# Patient Record
Sex: Male | Born: 1955 | Race: White | Hispanic: No | Marital: Married | Smoking: Former smoker
Health system: Southern US, Community
[De-identification: ages and names within clinical notes are randomized; demographics above are authoritative.]

## PROBLEM LIST (undated history)

## (undated) DIAGNOSIS — I10 Essential (primary) hypertension: Secondary | ICD-10-CM

## (undated) DIAGNOSIS — I4891 Unspecified atrial fibrillation: Secondary | ICD-10-CM

## (undated) DIAGNOSIS — M109 Gout, unspecified: Secondary | ICD-10-CM

## (undated) HISTORY — DX: Essential (primary) hypertension: I10

## (undated) HISTORY — PX: OTHER SURGICAL HISTORY: SHX169

## (undated) HISTORY — DX: Gout, unspecified: M10.9

---

## 2014-04-12 ENCOUNTER — Emergency Department: Payer: Self-pay | Admitting: Emergency Medicine

## 2014-04-12 ENCOUNTER — Ambulatory Visit
Admit: 2014-04-12 | Disposition: A | Discharge status: Home / Self Care | Payer: Self-pay | Admitting: Unknown Physician Specialty

## 2014-04-12 LAB — CBC
HCT: 47.5 % (ref 40.0–52.0)
HGB: 16.1 g/dL (ref 13.0–18.0)
MCH: 30.8 pg (ref 26.0–34.0)
MCHC: 33.9 g/dL (ref 32.0–36.0)
MCV: 91 fL (ref 80–100)
Platelet: 232 10*3/uL (ref 150–440)
RBC: 5.24 10*6/uL (ref 4.40–5.90)
RDW: 13.4 % (ref 11.5–14.5)
WBC: 7.5 10*3/uL (ref 3.8–10.6)

## 2014-04-12 LAB — BASIC METABOLIC PANEL
Anion Gap: 9 (ref 7–16)
BUN: 12 mg/dL (ref 7–18)
CALCIUM: 8.2 mg/dL — AB (ref 8.5–10.1)
CHLORIDE: 106 mmol/L (ref 98–107)
CO2: 24 mmol/L (ref 21–32)
Creatinine: 0.99 mg/dL (ref 0.60–1.30)
EGFR (African American): >60
Glucose: 89 mg/dL (ref 65–99)
OSMOLALITY: 277 (ref 275–301)
POTASSIUM: 3.9 mmol/L (ref 3.5–5.1)
Sodium: 139 mmol/L (ref 136–145)

## 2014-04-12 LAB — TROPONIN I: Troponin-I: <0.02 ng/mL

## 2014-07-09 ENCOUNTER — Ambulatory Visit
Admit: 2014-07-09 | Disposition: A | Discharge status: Home / Self Care | Payer: Self-pay | Attending: Family Medicine | Admitting: Family Medicine

## 2014-09-20 ENCOUNTER — Ambulatory Visit (INDEPENDENT_AMBULATORY_CARE_PROVIDER_SITE_OTHER): Payer: BLUE CROSS/BLUE SHIELD | Admitting: Family Medicine

## 2014-09-20 ENCOUNTER — Encounter: Payer: Self-pay | Admitting: Family Medicine

## 2014-09-20 VITALS — BP 125/74 | HR 93 | Temp 98.4°F | Ht 70.4 in | Wt 230.4 lb

## 2014-09-20 DIAGNOSIS — M109 Gout, unspecified: Secondary | ICD-10-CM | POA: Insufficient documentation

## 2014-09-20 DIAGNOSIS — M10041 Idiopathic gout, right hand: Secondary | ICD-10-CM | POA: Diagnosis not present

## 2014-09-20 MED ORDER — PREDNISONE 10 MG PO TABS: ORAL_TABLET | ORAL | Status: DC

## 2014-09-20 NOTE — Progress Notes (Signed)
  BP 125/74 mmHg  Pulse 93  Temp(Src) 98.4 F (36.9 C) (Oral)  Ht 5' 10.4" (1.788 m)  Wt 230 lb 6.4 oz (104.509 kg)  BMI 32.69 kg/m2  SpO2 99%   Subjective:    Patient ID: Travis Diaz, male    DOB: 1955-10-21, 59 y.o.   MRN: [id number]  HPI: Travis Diaz is a 59 y.o. male  Chief Complaint  Patient presents with  . Gout    patient is currently on colchicine, has not been able to start the Allopurinol yet   GOUT- has been working with Dr. Jeananne Rama to try to get the gout under control usually has pain every 2-3 days and hasn't been able to take the allopurinol  Duration:days Right 1st metatarsophalangeal pain: yes Severity: severe  Quality: tender Swelling: yes Redness: yes Trauma: no Recent dietary change or indiscretion: no Fevers: no Nausea/vomiting: no Status:  worse Treatments attempted: colcrys  Relevant past medical, surgical, family and social history reviewed and updated as indicated. Interim medical history since our last visit reviewed. Allergies and medications reviewed and updated.  Review of Systems  Constitutional: Negative.   Gastrointestinal: Negative.   Musculoskeletal: Positive for arthralgias. Negative for myalgias, back pain, joint swelling, gait problem, neck pain and neck stiffness.  Skin: Positive for color change. Negative for pallor, rash and wound.    Per HPI unless specifically indicated above     Objective:    BP 125/74 mmHg  Pulse 93  Temp(Src) 98.4 F (36.9 C) (Oral)  Ht 5' 10.4" (1.788 m)  Wt 230 lb 6.4 oz (104.509 kg)  BMI 32.69 kg/m2  SpO2 99%  Wt Readings from Last 3 Encounters:  09/20/14 229 lb (103.874 kg)  09/20/14 230 lb 6.4 oz (104.509 kg)    Physical Exam  Constitutional: He is oriented to person, place, and time. He appears well-developed and well-nourished. No distress.  HENT:  Head: Normocephalic and atraumatic.  Right Ear: Hearing normal.  Left Ear: Hearing normal.  Nose: Nose normal.  Eyes:  Conjunctivae and lids are normal. Right eye exhibits no discharge. Left eye exhibits no discharge. No scleral icterus.  Pulmonary/Chest: Effort normal. No respiratory distress.  Musculoskeletal: Normal range of motion. He exhibits edema.  Neurological: He is alert and oriented to person, place, and time.  Skin: Skin is warm, dry and intact. No rash noted. There is erythema. There is pallor.  1st metacarpal joint on the R hand swollen red, and very tender to touch  Psychiatric: He has a normal mood and affect. His speech is normal and behavior is normal. Judgment and thought content normal. Cognition and memory are normal.      Assessment & Plan:   Problem List Items Addressed This Visit      Other   Acute gout - Primary    In acute flare. Has not been able to get his gout under control in over 3 years. We will refer to rheumatology to see if we can get him under control and will treat with prednisone instead of the colcrys. Call with any concerns or if not getting better.       Relevant Orders   Ambulatory referral to Rheumatology       Follow up plan: Return if symptoms worsen or fail to improve.

## 2014-09-20 NOTE — Assessment & Plan Note (Signed)
In acute flare. Has not been able to get his gout under control in over 3 years. We will refer to rheumatology to see if we can get him under control and will treat with prednisone instead of the colcrys. Call with any concerns or if not getting better.

## 2014-09-20 NOTE — Patient Instructions (Signed)

## 2014-10-16 ENCOUNTER — Telehealth: Payer: Self-pay

## 2014-10-16 NOTE — Telephone Encounter (Signed)
Received a fax from rheumatology, they have not been able to reach him. They tried calling him 3 or 4 times.

## 2014-11-11 ENCOUNTER — Telehealth: Payer: Self-pay | Admitting: Family Medicine

## 2014-11-11 NOTE — Telephone Encounter (Signed)
Pt needs a refill on benazapril and alliopurinol sent to [facility]

## 2014-11-15 ENCOUNTER — Telehealth: Payer: Self-pay | Admitting: Family Medicine

## 2014-11-15 MED ORDER — ALLOPURINOL 300 MG PO TABS: 300.0000 mg | ORAL_TABLET | Freq: Every day | ORAL | Status: DC

## 2014-11-15 MED ORDER — BENAZEPRIL HCL 40 MG PO TABS: 40.0000 mg | ORAL_TABLET | Freq: Every day | ORAL | Status: DC

## 2014-11-15 NOTE — Telephone Encounter (Signed)
PT WOULD LIKE ENOUGH  ALLOPURINOL AND BENAZOPRIL TO LAST HIM UNTIL HIS LAST APPT ON 8/31 SENT TO [facility]

## 2014-11-18 MED ORDER — BENAZEPRIL HCL 40 MG PO TABS: 40.0000 mg | ORAL_TABLET | Freq: Every day | ORAL | Status: DC

## 2014-11-18 MED ORDER — ALLOPURINOL 300 MG PO TABS: 300.0000 mg | ORAL_TABLET | Freq: Every day | ORAL | Status: DC

## 2014-11-18 NOTE — Telephone Encounter (Signed)
Your patient.  Thanks 

## 2014-11-27 ENCOUNTER — Encounter: Payer: Self-pay | Admitting: Family Medicine

## 2014-11-27 ENCOUNTER — Ambulatory Visit (INDEPENDENT_AMBULATORY_CARE_PROVIDER_SITE_OTHER): Payer: BLUE CROSS/BLUE SHIELD | Admitting: Family Medicine

## 2014-11-27 VITALS — BP 122/84 | HR 92 | Temp 99.2°F | Ht 71.0 in | Wt 227.0 lb

## 2014-11-27 DIAGNOSIS — I10 Essential (primary) hypertension: Secondary | ICD-10-CM | POA: Insufficient documentation

## 2014-11-27 DIAGNOSIS — M1000 Idiopathic gout, unspecified site: Secondary | ICD-10-CM | POA: Diagnosis not present

## 2014-11-27 MED ORDER — BENAZEPRIL HCL 40 MG PO TABS: 40.0000 mg | ORAL_TABLET | Freq: Every day | ORAL | Status: DC

## 2014-11-27 MED ORDER — ALLOPURINOL 300 MG PO TABS: 300.0000 mg | ORAL_TABLET | Freq: Every day | ORAL | Status: DC

## 2014-11-27 NOTE — Assessment & Plan Note (Signed)
The current medical regimen is effective;  continue present plan and medications.  

## 2014-11-27 NOTE — Progress Notes (Signed)
BP 122/84 mmHg  Pulse 92  Temp(Src) 99.2 F (37.3 C)  Ht 5' 11"  (1.803 m)  Wt 227 lb (102.967 kg)  BMI 31.67 kg/m2  SpO2 96%   Subjective:    Patient ID: Travis Diaz, male    DOB: 25-Sep-1955, 59 y.o.   MRN: [id number]  HPI: Travis Diaz is a 59 y.o. male  Chief Complaint  Patient presents with  . Hypertension  . Gout   Patient doing well blood pressure no complaints no problems from medications taken faithfully  Gout prednisone stopped pain caused weight gain had problems. Prednisone weight is back to baseline no further gout symptoms taking allopurinol without any problems.   Has some mild wrist arthritis symptoms in his right wristRelevant past medical, surgical, family and social history reviewed and updated as indicated. Interim medical history since our last visit reviewed. Allergies and medications reviewed and updated.  Review of Systems  Constitutional: Negative.   Respiratory: Negative.   Cardiovascular: Negative.     Per HPI unless specifically indicated above     Objective:    BP 122/84 mmHg  Pulse 92  Temp(Src) 99.2 F (37.3 C)  Ht 5' 11"  (1.803 m)  Wt 227 lb (102.967 kg)  BMI 31.67 kg/m2  SpO2 96%  Wt Readings from Last 3 Encounters:  11/27/14 227 lb (102.967 kg)  09/20/14 229 lb (103.874 kg)  09/20/14 230 lb 6.4 oz (104.509 kg)    Physical Exam  Constitutional: He is oriented to person, place, and time. He appears well-developed and well-nourished. No distress.  HENT:  Head: Normocephalic and atraumatic.  Right Ear: Hearing normal.  Left Ear: Hearing normal.  Nose: Nose normal.  Eyes: Conjunctivae and lids are normal. Right eye exhibits no discharge. Left eye exhibits no discharge. No scleral icterus.  Cardiovascular: Normal rate, regular rhythm and normal heart sounds.   Pulmonary/Chest: Effort normal and breath sounds normal. No respiratory distress.  Musculoskeletal: Normal range of motion.  Neurological: He is alert  and oriented to person, place, and time.  Skin: Skin is intact. No rash noted.  Psychiatric: He has a normal mood and affect. His speech is normal and behavior is normal. Judgment and thought content normal. Cognition and memory are normal.    Results for orders placed or performed in visit on 04/12/14  Troponin I  Result Value Ref Range   Troponin-I < 0.02 ng/mL  CBC  Result Value Ref Range   WBC 7.5 3.8-10.6 x10 3/mm 3   RBC 5.24 4.40-5.90 x10 6/mm 3   HGB 16.1 13.0-18.0 g/dL   HCT 47.5 40.0-52.0 %   MCV 91 80-100 fL   MCH 30.8 26.0-34.0 pg   MCHC 33.9 32.0-36.0 g/dL   RDW 13.4 11.5-14.5 %   Platelet 232 150-440 x10 3/mm 3  Basic metabolic panel  Result Value Ref Range   Glucose 89 65-99 mg/dL   BUN 12 7-18 mg/dL   Creatinine 0.99 0.60-1.30 mg/dL   Sodium 139 136-145 mmol/L   Potassium 3.9 3.5-5.1 mmol/L   Chloride 106 98-107 mmol/L   Co2 24 21-32 mmol/L   Calcium, Total 8.2 (L) 8.5-10.1 mg/dL   Osmolality 277 275-301   Anion Gap 9 7-16   EGFR (African American) >60 >23m/min   EGFR (Non-African Amer.) >60 >622mmin      Assessment & Plan:   Problem List Items Addressed This Visit      Cardiovascular and Mediastinum   Hypertension    The current medical regimen is effective;  continue present plan and medications.       Relevant Orders   Basic metabolic panel     Other   Acute gout - Primary    The current medical regimen is effective;  continue present plan and medications.       Relevant Orders   Uric acid       Follow up plan: Return if symptoms worsen or fail to improve, for Physical Exam.

## 2014-11-27 NOTE — Addendum Note (Signed)
Addended byGolden Pop on: 11/27/2014 04:17 PM   Modules accepted: Orders, SmartSet

## 2014-11-28 ENCOUNTER — Encounter: Payer: Self-pay | Admitting: Family Medicine

## 2014-11-28 LAB — BASIC METABOLIC PANEL
BUN/Creatinine Ratio: 19 (ref 9–20)
BUN: 20 mg/dL (ref 6–24)
CO2: 21 mmol/L (ref 18–29)
CREATININE: 1.08 mg/dL (ref 0.76–1.27)
Calcium: 9.2 mg/dL (ref 8.7–10.2)
Chloride: 101 mmol/L (ref 97–108)
GFR, EST AFRICAN AMERICAN: 86 mL/min/{1.73_m2} (ref >59)
GFR, EST NON AFRICAN AMERICAN: 75 mL/min/{1.73_m2} (ref >59)
Glucose: 81 mg/dL (ref 65–99)
Potassium: 4.8 mmol/L (ref 3.5–5.2)
SODIUM: 140 mmol/L (ref 134–144)

## 2014-11-28 LAB — URIC ACID: Uric Acid: 4.9 mg/dL (ref 3.7–8.6)

## 2015-01-09 ENCOUNTER — Encounter: Payer: Self-pay | Admitting: Family Medicine

## 2015-01-09 ENCOUNTER — Ambulatory Visit (INDEPENDENT_AMBULATORY_CARE_PROVIDER_SITE_OTHER): Payer: BLUE CROSS/BLUE SHIELD | Admitting: Family Medicine

## 2015-01-09 VITALS — BP 123/87 | HR 116 | Temp 99.2°F | Ht 69.4 in | Wt 223.0 lb

## 2015-01-09 DIAGNOSIS — Z0000 Encounter for general adult medical examination without abnormal findings: Secondary | ICD-10-CM | POA: Diagnosis not present

## 2015-01-09 DIAGNOSIS — I10 Essential (primary) hypertension: Secondary | ICD-10-CM | POA: Diagnosis not present

## 2015-01-09 DIAGNOSIS — M129 Arthropathy, unspecified: Secondary | ICD-10-CM

## 2015-01-09 DIAGNOSIS — M1000 Idiopathic gout, unspecified site: Secondary | ICD-10-CM

## 2015-01-09 DIAGNOSIS — M170 Bilateral primary osteoarthritis of knee: Secondary | ICD-10-CM | POA: Insufficient documentation

## 2015-01-09 LAB — URINALYSIS, ROUTINE W REFLEX MICROSCOPIC
BILIRUBIN UA: NEGATIVE
GLUCOSE, UA: NEGATIVE
Ketones, UA: NEGATIVE
Leukocytes, UA: NEGATIVE
Nitrite, UA: NEGATIVE
PROTEIN UA: NEGATIVE
RBC, UA: NEGATIVE
SPEC GRAV UA: 1.03 (ref 1.005–1.030)
Urobilinogen, Ur: 0.2 mg/dL (ref 0.2–1.0)
pH, UA: 5 (ref 5.0–7.5)

## 2015-01-09 MED ORDER — ALLOPURINOL 300 MG PO TABS: 300.0000 mg | ORAL_TABLET | Freq: Every day | ORAL | Status: DC

## 2015-01-09 MED ORDER — MELOXICAM 15 MG PO TABS: 15.0000 mg | ORAL_TABLET | Freq: Every day | ORAL | Status: DC

## 2015-01-09 MED ORDER — BENAZEPRIL HCL 40 MG PO TABS: 40.0000 mg | ORAL_TABLET | Freq: Every day | ORAL | Status: DC

## 2015-01-09 NOTE — Progress Notes (Signed)
BP 123/87 mmHg  Pulse 116  Temp(Src) 99.2 F (37.3 C)  Ht 5' 9.4" (1.763 m)  Wt 223 lb (101.152 kg)  BMI 32.54 kg/m2  SpO2 97%   Subjective:    Patient ID: Travis Diaz, male    DOB: 02-08-1956, 59 y.o.   MRN: [id number]  HPI: Travis Diaz is a 59 y.o. male  Chief Complaint  Patient presents with  . Annual Exam   patient for physical doing well except for times at work has to work 30 days in a row. Works for [employer]. Patient's right knee will occasionally swell big with no known trauma or irritation. Gout is doing well and under control Knee problem has been going on for the last 3-4 years patient thought it was gout but after gout is been controlled knee problems still comes on. This will occur as often as once a month or so. Sometimes it'll go from knee to knee. Patient doesn't do things that would aggravate his knees other than walking a concrete floor.  Relevant past medical, surgical, family and social history reviewed and updated as indicated. Interim medical history since our last visit reviewed. Allergies and medications reviewed and updated.  Review of Systems  Constitutional: Negative.   HENT: Negative.   Eyes: Negative.   Respiratory: Negative.   Cardiovascular: Negative.   Gastrointestinal: Negative.   Endocrine: Negative.   Genitourinary: Negative.   Musculoskeletal: Negative.   Skin: Negative.   Allergic/Immunologic: Negative.   Neurological: Negative.   Hematological: Negative.   Psychiatric/Behavioral: Negative.     Per HPI unless specifically indicated above     Objective:    BP 123/87 mmHg  Pulse 116  Temp(Src) 99.2 F (37.3 C)  Ht 5' 9.4" (1.763 m)  Wt 223 lb (101.152 kg)  BMI 32.54 kg/m2  SpO2 97%  Wt Readings from Last 3 Encounters:  01/09/15 223 lb (101.152 kg)  11/27/14 227 lb (102.967 kg)  09/20/14 229 lb (103.874 kg)    Physical Exam  Constitutional: He is oriented to person, place, and time. He appears  well-developed and well-nourished.  HENT:  Head: Normocephalic and atraumatic.  Right Ear: External ear normal.  Left Ear: External ear normal.  Eyes: Conjunctivae and EOM are normal. Pupils are equal, round, and reactive to light.  Neck: Normal range of motion. Neck supple.  Cardiovascular: Normal rate, regular rhythm, normal heart sounds and intact distal pulses.   Pulmonary/Chest: Effort normal and breath sounds normal.  Abdominal: Soft. Bowel sounds are normal. There is no splenomegaly or hepatomegaly.  Genitourinary: Rectum normal, prostate normal and penis normal.  Musculoskeletal: Normal range of motion.  Neurological: He is alert and oriented to person, place, and time. He has normal reflexes.  Skin: No rash noted. No erythema.  Psychiatric: He has a normal mood and affect. His behavior is normal. Judgment and thought content normal.    Results for orders placed or performed in visit on 11/27/14  Uric acid  Result Value Ref Range   Uric Acid 4.9 3.7 - 8.6 mg/dL  Basic metabolic panel  Result Value Ref Range   Glucose 81 65 - 99 mg/dL   BUN 20 6 - 24 mg/dL   Creatinine, Ser 1.08 0.76 - 1.27 mg/dL   GFR calc non Af Amer 75 >59 mL/min/1.73   GFR calc Af Amer 86 >59 mL/min/1.73   BUN/Creatinine Ratio 19 9 - 20   Sodium 140 134 - 144 mmol/L   Potassium 4.8 3.5 - 5.2  mmol/L   Chloride 101 97 - 108 mmol/L   CO2 21 18 - 29 mmol/L   Calcium 9.2 8.7 - 10.2 mg/dL      Assessment & Plan:   Problem List Items Addressed This Visit      Cardiovascular and Mediastinum   Hypertension - Primary    The current medical regimen is effective;  continue present plan and medications.       Relevant Medications   benazepril (LOTENSIN) 40 MG tablet     Musculoskeletal and Integument   Arthritis of both knees    Discussed degenerative arthritis both knees Patient will wear neoprene sleeve from time to time  given prescription for meloxicam to use from time to time Discuss knee  health shoes and self-care. Discuss may need orthopedic referral at some point Fill out FMLA papers      Relevant Medications   allopurinol (ZYLOPRIM) 300 MG tablet   meloxicam (MOBIC) 15 MG tablet     Other   Acute gout    Patient with no further gout symptoms will continue allopurinol and hold colchicine.      Relevant Orders   Uric acid    Other Visit Diagnoses    PE (physical exam), annual        Relevant Orders    Comprehensive metabolic panel    Lipid panel    CBC with Differential/Platelet    PSA    Urinalysis, Routine w reflex microscopic (not at [facility])    TSH        Follow up plan: Return in about 6 months (around 07/10/2015) for Recheck BMP uric acid and knee arthritis.

## 2015-01-09 NOTE — Assessment & Plan Note (Signed)
The current medical regimen is effective;  continue present plan and medications.  

## 2015-01-09 NOTE — Assessment & Plan Note (Signed)
Patient with no further gout symptoms will continue allopurinol and hold colchicine.

## 2015-01-09 NOTE — Assessment & Plan Note (Addendum)
Discussed degenerative arthritis both knees Patient will wear neoprene sleeve from time to time  given prescription for meloxicam to use from time to time Discuss knee health shoes and self-care. Discuss may need orthopedic referral at some point Fill out FMLA papers

## 2015-01-10 LAB — CBC WITH DIFFERENTIAL/PLATELET
BASOS ABS: 0 10*3/uL (ref 0.0–0.2)
Basos: 0 %
EOS (ABSOLUTE): 0.2 10*3/uL (ref 0.0–0.4)
Eos: 2 %
HEMOGLOBIN: 15.7 g/dL (ref 12.6–17.7)
Hematocrit: 44.4 % (ref 37.5–51.0)
Immature Grans (Abs): 0.1 10*3/uL (ref 0.0–0.1)
Immature Granulocytes: 1 %
LYMPHS ABS: 1.4 10*3/uL (ref 0.7–3.1)
Lymphs: 14 %
MCH: 31 pg (ref 26.6–33.0)
MCHC: 35.4 g/dL (ref 31.5–35.7)
MCV: 88 fL (ref 79–97)
MONOS ABS: 0.7 10*3/uL (ref 0.1–0.9)
Monocytes: 7 %
NEUTROS ABS: 7.3 10*3/uL — AB (ref 1.4–7.0)
Neutrophils: 76 %
Platelets: 263 10*3/uL (ref 150–379)
RBC: 5.06 x10E6/uL (ref 4.14–5.80)
RDW: 13.5 % (ref 12.3–15.4)
WBC: 9.6 10*3/uL (ref 3.4–10.8)

## 2015-01-10 LAB — COMPREHENSIVE METABOLIC PANEL
ALT: 20 IU/L (ref 0–44)
AST: 18 IU/L (ref 0–40)
Albumin/Globulin Ratio: 1.9 (ref 1.1–2.5)
Albumin: 4.3 g/dL (ref 3.6–4.8)
Alkaline Phosphatase: 73 IU/L (ref 39–117)
BUN/Creatinine Ratio: 19 (ref 10–22)
BUN: 27 mg/dL (ref 8–27)
Bilirubin Total: 0.4 mg/dL (ref 0.0–1.2)
CALCIUM: 8.9 mg/dL (ref 8.6–10.2)
CO2: 23 mmol/L (ref 18–29)
CREATININE: 1.39 mg/dL — AB (ref 0.76–1.27)
Chloride: 100 mmol/L (ref 97–108)
GFR, EST AFRICAN AMERICAN: 63 mL/min/{1.73_m2} (ref >59)
GFR, EST NON AFRICAN AMERICAN: 55 mL/min/{1.73_m2} — AB (ref >59)
Globulin, Total: 2.3 g/dL (ref 1.5–4.5)
Glucose: 92 mg/dL (ref 65–99)
Potassium: 5.1 mmol/L (ref 3.5–5.2)
Sodium: 139 mmol/L (ref 134–144)
TOTAL PROTEIN: 6.6 g/dL (ref 6.0–8.5)

## 2015-01-10 LAB — LIPID PANEL
CHOL/HDL RATIO: 3.2 ratio (ref 0.0–5.0)
Cholesterol, Total: 211 mg/dL — ABNORMAL HIGH (ref 100–199)
HDL: 65 mg/dL (ref >39)
LDL CALC: 126 mg/dL — AB (ref 0–99)
TRIGLYCERIDES: 99 mg/dL (ref 0–149)
VLDL CHOLESTEROL CAL: 20 mg/dL (ref 5–40)

## 2015-01-10 LAB — URIC ACID: Uric Acid: 5.4 mg/dL (ref 3.7–8.6)

## 2015-01-10 LAB — PSA: PROSTATE SPECIFIC AG, SERUM: 1.5 ng/mL (ref 0.0–4.0)

## 2015-01-10 LAB — TSH: TSH: 5.28 u[IU]/mL — ABNORMAL HIGH (ref 0.450–4.500)

## 2015-01-13 ENCOUNTER — Telehealth: Payer: Self-pay | Admitting: Family Medicine

## 2015-01-13 DIAGNOSIS — N183 Chronic kidney disease, stage 3 unspecified: Secondary | ICD-10-CM

## 2015-01-13 DIAGNOSIS — R7989 Other specified abnormal findings of blood chemistry: Secondary | ICD-10-CM

## 2015-01-13 NOTE — Telephone Encounter (Signed)
Phone call Discussed with patient renal function declining. Patient taking a lot of nonsteroidal agents will discontinue those use Tylenol Recheck BMP 1 month Patient's TSH is increased we will recheck TSH 1 month

## 2015-01-13 NOTE — Telephone Encounter (Signed)
-----   Message from Wynn Maudlin, [place] sent at 01/13/2015  5:10 PM EDT ----- labs

## 2015-07-14 ENCOUNTER — Ambulatory Visit (INDEPENDENT_AMBULATORY_CARE_PROVIDER_SITE_OTHER): Payer: BLUE CROSS/BLUE SHIELD | Admitting: Family Medicine

## 2015-07-14 ENCOUNTER — Encounter: Payer: Self-pay | Admitting: Family Medicine

## 2015-07-14 VITALS — BP 116/82 | HR 72 | Temp 98.7°F | Ht 70.5 in | Wt 230.0 lb

## 2015-07-14 DIAGNOSIS — M1000 Idiopathic gout, unspecified site: Secondary | ICD-10-CM | POA: Diagnosis not present

## 2015-07-14 DIAGNOSIS — N183 Chronic kidney disease, stage 3 unspecified: Secondary | ICD-10-CM

## 2015-07-14 DIAGNOSIS — R946 Abnormal results of thyroid function studies: Secondary | ICD-10-CM

## 2015-07-14 DIAGNOSIS — I10 Essential (primary) hypertension: Secondary | ICD-10-CM

## 2015-07-14 DIAGNOSIS — R7989 Other specified abnormal findings of blood chemistry: Secondary | ICD-10-CM

## 2015-07-14 NOTE — Assessment & Plan Note (Signed)
The current medical regimen is effective;  continue present plan and medications.  

## 2015-07-14 NOTE — Progress Notes (Signed)
BP 116/82 mmHg  Pulse 72  Temp(Src) 98.7 F (37.1 C)  Ht 5' 10.5" (1.791 m)  Wt 230 lb (104.327 kg)  BMI 32.52 kg/m2  SpO2 96%   Subjective:    Patient ID: Travis Diaz, male    DOB: 06-18-1955, 60 y.o.   MRN: [id number]  HPI: Travis Diaz is a 60 y.o. male  Chief Complaint  Patient presents with  . Arthritis  . uric acid recheck  . Recheck TSH  Patient all in all doing well no gout flares no complaints no gout complaints or symptoms blood pressure doing well. No thyroid signs or symptoms for recheck TSH because elevated last visit  Relevant past medical, surgical, family and social history reviewed and updated as indicated. Interim medical history since our last visit reviewed. Allergies and medications reviewed and updated.  Review of Systems  Constitutional: Negative.   Respiratory: Negative.   Cardiovascular: Negative.     Per HPI unless specifically indicated above     Objective:    BP 116/82 mmHg  Pulse 72  Temp(Src) 98.7 F (37.1 C)  Ht 5' 10.5" (1.791 m)  Wt 230 lb (104.327 kg)  BMI 32.52 kg/m2  SpO2 96%  Wt Readings from Last 3 Encounters:  07/14/15 230 lb (104.327 kg)  01/09/15 223 lb (101.152 kg)  11/27/14 227 lb (102.967 kg)    Physical Exam  Constitutional: He is oriented to person, place, and time. He appears well-developed and well-nourished. No distress.  HENT:  Head: Normocephalic and atraumatic.  Right Ear: Hearing normal.  Left Ear: Hearing normal.  Nose: Nose normal.  Eyes: Conjunctivae and lids are normal. Right eye exhibits no discharge. Left eye exhibits no discharge. No scleral icterus.  Cardiovascular: Normal rate, regular rhythm and normal heart sounds.   Pulmonary/Chest: Effort normal and breath sounds normal. No respiratory distress.  Musculoskeletal: Normal range of motion.  Neurological: He is alert and oriented to person, place, and time.  Skin: Skin is intact. No rash noted.  Psychiatric: He has a normal  mood and affect. His speech is normal and behavior is normal. Judgment and thought content normal. Cognition and memory are normal.    Results for orders placed or performed in visit on 01/09/15  Comprehensive metabolic panel  Result Value Ref Range   Glucose 92 65 - 99 mg/dL   BUN 27 8 - 27 mg/dL   Creatinine, Ser 1.39 (H) 0.76 - 1.27 mg/dL   GFR calc non Af Amer 55 (L) >59 mL/min/1.73   GFR calc Af Amer 63 >59 mL/min/1.73   BUN/Creatinine Ratio 19 10 - 22   Sodium 139 134 - 144 mmol/L   Potassium 5.1 3.5 - 5.2 mmol/L   Chloride 100 97 - 108 mmol/L   CO2 23 18 - 29 mmol/L   Calcium 8.9 8.6 - 10.2 mg/dL   Total Protein 6.6 6.0 - 8.5 g/dL   Albumin 4.3 3.6 - 4.8 g/dL   Globulin, Total 2.3 1.5 - 4.5 g/dL   Albumin/Globulin Ratio 1.9 1.1 - 2.5   Bilirubin Total 0.4 0.0 - 1.2 mg/dL   Alkaline Phosphatase 73 39 - 117 IU/L   AST 18 0 - 40 IU/L   ALT 20 0 - 44 IU/L  Lipid panel  Result Value Ref Range   Cholesterol, Total 211 (H) 100 - 199 mg/dL   Triglycerides 99 0 - 149 mg/dL   HDL 65 >39 mg/dL   VLDL Cholesterol Cal 20 5 - 40 mg/dL  LDL Calculated 126 (H) 0 - 99 mg/dL   Chol/HDL Ratio 3.2 0.0 - 5.0 ratio units  CBC with Differential/Platelet  Result Value Ref Range   WBC 9.6 3.4 - 10.8 x10E3/uL   RBC 5.06 4.14 - 5.80 x10E6/uL   Hemoglobin 15.7 12.6 - 17.7 g/dL   Hematocrit 44.4 37.5 - 51.0 %   MCV 88 79 - 97 fL   MCH 31.0 26.6 - 33.0 pg   MCHC 35.4 31.5 - 35.7 g/dL   RDW 13.5 12.3 - 15.4 %   Platelets 263 150 - 379 x10E3/uL   Neutrophils 76 %   Lymphs 14 %   Monocytes 7 %   Eos 2 %   Basos 0 %   Neutrophils Absolute 7.3 (H) 1.4 - 7.0 x10E3/uL   Lymphocytes Absolute 1.4 0.7 - 3.1 x10E3/uL   Monocytes Absolute 0.7 0.1 - 0.9 x10E3/uL   EOS (ABSOLUTE) 0.2 0.0 - 0.4 x10E3/uL   Basophils Absolute 0.0 0.0 - 0.2 x10E3/uL   Immature Granulocytes 1 %   Immature Grans (Abs) 0.1 0.0 - 0.1 x10E3/uL  PSA  Result Value Ref Range   Prostate Specific Ag, Serum 1.5 0.0 - 4.0  ng/mL  Urinalysis, Routine w reflex microscopic (not at [facility])  Result Value Ref Range   Specific Gravity, UA 1.030 1.005 - 1.030   pH, UA 5.0 5.0 - 7.5   Color, UA Yellow Yellow   Appearance Ur Clear Clear   Leukocytes, UA Negative Negative   Protein, UA Negative Negative/Trace   Glucose, UA Negative Negative   Ketones, UA Negative Negative   RBC, UA Negative Negative   Bilirubin, UA Negative Negative   Urobilinogen, Ur 0.2 0.2 - 1.0 mg/dL   Nitrite, UA Negative Negative  TSH  Result Value Ref Range   TSH 5.280 (H) 0.450 - 4.500 uIU/mL  Uric acid  Result Value Ref Range   Uric Acid 5.4 3.7 - 8.6 mg/dL      Assessment & Plan:   Problem List Items Addressed This Visit      Cardiovascular and Mediastinum   Hypertension    The current medical regimen is effective;  continue present plan and medications.         Other   Acute gout - Primary    The current medical regimen is effective;  continue present plan and medications.       Relevant Orders   Uric acid    Other Visit Diagnoses    Elevated TSH        recheck TSH    CKD (chronic kidney disease) stage 3, GFR 30-59 ml/min            Follow up plan: Return in about 6 months (around 01/13/2016) for Physical Exam.

## 2015-07-15 LAB — BASIC METABOLIC PANEL
BUN / CREAT RATIO: 15 (ref 10–24)
BUN: 19 mg/dL (ref 8–27)
CO2: 21 mmol/L (ref 18–29)
CREATININE: 1.29 mg/dL — AB (ref 0.76–1.27)
Calcium: 9.2 mg/dL (ref 8.6–10.2)
Chloride: 102 mmol/L (ref 96–106)
GFR calc Af Amer: 69 mL/min/{1.73_m2} (ref >59)
GFR, EST NON AFRICAN AMERICAN: 60 mL/min/{1.73_m2} (ref >59)
Glucose: 82 mg/dL (ref 65–99)
POTASSIUM: 5.2 mmol/L (ref 3.5–5.2)
SODIUM: 141 mmol/L (ref 134–144)

## 2015-07-15 LAB — TSH: TSH: 5.59 u[IU]/mL — AB (ref 0.450–4.500)

## 2015-07-15 LAB — URIC ACID: URIC ACID: 5.4 mg/dL (ref 3.7–8.6)

## 2015-07-17 ENCOUNTER — Telehealth: Payer: Self-pay | Admitting: Family Medicine

## 2015-07-17 NOTE — Telephone Encounter (Signed)
Phone call Discussed with patient TSH still slightly elevated patient with no low thyroid symptoms will observe recheck TSH in October had physical exam time.

## 2015-12-16 ENCOUNTER — Encounter (INDEPENDENT_AMBULATORY_CARE_PROVIDER_SITE_OTHER): Payer: Self-pay

## 2015-12-18 ENCOUNTER — Telehealth: Payer: Self-pay | Admitting: Family Medicine

## 2015-12-18 MED ORDER — ALLOPURINOL 300 MG PO TABS: 300.0000 mg | ORAL_TABLET | Freq: Every day | ORAL | 1 refills | Status: DC

## 2015-12-18 MED ORDER — BENAZEPRIL HCL 40 MG PO TABS: 40.0000 mg | ORAL_TABLET | Freq: Every day | ORAL | 1 refills | Status: DC

## 2015-12-18 NOTE — Telephone Encounter (Signed)
Pt came in stated his pharmacy has been trying to contact us. Pt needs a refill Allopurinol and Benazepril. Pharm is [facility] Thanks.

## 2016-01-13 ENCOUNTER — Encounter: Payer: Self-pay | Admitting: Family Medicine

## 2016-01-13 ENCOUNTER — Ambulatory Visit (INDEPENDENT_AMBULATORY_CARE_PROVIDER_SITE_OTHER): Payer: BLUE CROSS/BLUE SHIELD | Admitting: Family Medicine

## 2016-01-13 VITALS — BP 133/88 | HR 97 | Temp 98.3°F | Ht 70.75 in | Wt 232.0 lb

## 2016-01-13 DIAGNOSIS — R946 Abnormal results of thyroid function studies: Secondary | ICD-10-CM

## 2016-01-13 DIAGNOSIS — N183 Chronic kidney disease, stage 3 unspecified: Secondary | ICD-10-CM

## 2016-01-13 DIAGNOSIS — M1000 Idiopathic gout, unspecified site: Secondary | ICD-10-CM | POA: Diagnosis not present

## 2016-01-13 DIAGNOSIS — I10 Essential (primary) hypertension: Secondary | ICD-10-CM | POA: Diagnosis not present

## 2016-01-13 DIAGNOSIS — Z0000 Encounter for general adult medical examination without abnormal findings: Secondary | ICD-10-CM | POA: Diagnosis not present

## 2016-01-13 DIAGNOSIS — M170 Bilateral primary osteoarthritis of knee: Secondary | ICD-10-CM

## 2016-01-13 DIAGNOSIS — Z23 Encounter for immunization: Secondary | ICD-10-CM

## 2016-01-13 DIAGNOSIS — Z1211 Encounter for screening for malignant neoplasm of colon: Secondary | ICD-10-CM

## 2016-01-13 DIAGNOSIS — R7989 Other specified abnormal findings of blood chemistry: Secondary | ICD-10-CM

## 2016-01-13 LAB — MICROSCOPIC EXAMINATION
Bacteria, UA: NONE SEEN
EPITHELIAL CELLS (NON RENAL): NONE SEEN /HPF (ref 0–10)
RBC MICROSCOPIC, UA: NONE SEEN /HPF (ref 0–?)
WBC, UA: NONE SEEN /hpf (ref 0–?)

## 2016-01-13 LAB — URINALYSIS, ROUTINE W REFLEX MICROSCOPIC
BILIRUBIN UA: NEGATIVE
Glucose, UA: NEGATIVE
Ketones, UA: NEGATIVE
LEUKOCYTES UA: NEGATIVE
Nitrite, UA: NEGATIVE
PROTEIN UA: NEGATIVE
Specific Gravity, UA: 1.025 (ref 1.005–1.030)
Urobilinogen, Ur: 0.2 mg/dL (ref 0.2–1.0)
pH, UA: 5 (ref 5.0–7.5)

## 2016-01-13 MED ORDER — BENAZEPRIL HCL 40 MG PO TABS: 40.0000 mg | ORAL_TABLET | Freq: Every day | ORAL | 12 refills | Status: DC

## 2016-01-13 MED ORDER — ALLOPURINOL 300 MG PO TABS: 300.0000 mg | ORAL_TABLET | Freq: Every day | ORAL | 12 refills | Status: DC

## 2016-01-13 MED ORDER — MELOXICAM 15 MG PO TABS: 15.0000 mg | ORAL_TABLET | Freq: Every day | ORAL | 3 refills | Status: DC

## 2016-01-13 NOTE — Assessment & Plan Note (Signed)
The current medical regimen is effective;  continue present plan and medications.  

## 2016-01-13 NOTE — Progress Notes (Signed)
BP 133/88   Pulse 97   Temp 98.3 F (36.8 C)   Ht 5' 10.75" (1.797 m)   Wt 232 lb (105.2 kg)   SpO2 97%   BMI 32.59 kg/m    Subjective:    Patient ID: Travis Diaz, male    DOB: 1955/12/18, 60 y.o.   MRN: [id number]  HPI: Travis Diaz is a 60 y.o. male  Chief Complaint  Patient presents with  . Annual Exam    order placed for colonoscopy   Patient concerned about thyroid status is can usually lose weight in the summer time and just hasn't has had more difficulty with weight loss eats less than he has before.  On meloxicam uses when necessary for arthritis aches and pains hasn't used much in his been okay.   Blood pressure doing well no complaints from medications taken faithfully  On no gout symptoms taking allopurinol without problems.  Relevant past medical, surgical, family and social history reviewed and updated as indicated. Interim medical history since our last visit reviewed. Allergies and medications reviewed and updated.  Review of Systems  Constitutional: Negative.   HENT: Negative.   Eyes: Negative.   Respiratory: Negative.   Cardiovascular: Negative.   Gastrointestinal: Negative.   Endocrine: Negative.   Genitourinary: Negative.   Musculoskeletal: Negative.   Skin: Negative.   Allergic/Immunologic: Negative.   Neurological: Negative.   Hematological: Negative.   Psychiatric/Behavioral: Negative.     Per HPI unless specifically indicated above     Objective:    BP 133/88   Pulse 97   Temp 98.3 F (36.8 C)   Ht 5' 10.75" (1.797 m)   Wt 232 lb (105.2 kg)   SpO2 97%   BMI 32.59 kg/m   Wt Readings from Last 3 Encounters:  01/13/16 232 lb (105.2 kg)  07/14/15 230 lb (104.3 kg)  01/09/15 223 lb (101.2 kg)    Physical Exam  Constitutional: He is oriented to person, place, and time. He appears well-developed and well-nourished.  HENT:  Head: Normocephalic and atraumatic.  Right Ear: External ear normal.  Left Ear: External  ear normal.  Eyes: Conjunctivae and EOM are normal. Pupils are equal, round, and reactive to light.  Neck: Normal range of motion. Neck supple.  Cardiovascular: Normal rate, regular rhythm, normal heart sounds and intact distal pulses.   Pulmonary/Chest: Effort normal and breath sounds normal.  Abdominal: Soft. Bowel sounds are normal. There is no splenomegaly or hepatomegaly.  Genitourinary: Rectum normal, prostate normal and penis normal.  Musculoskeletal: Normal range of motion.  Neurological: He is alert and oriented to person, place, and time. He has normal reflexes.  Skin: No rash noted. No erythema.  Psychiatric: He has a normal mood and affect. His behavior is normal. Judgment and thought content normal.    Results for orders placed or performed in visit on 07/14/15  Uric acid  Result Value Ref Range   Uric Acid 5.4 3.7 - 8.6 mg/dL  TSH  Result Value Ref Range   TSH 5.590 (H) 0.450 - 4.500 uIU/mL  Basic metabolic panel  Result Value Ref Range   Glucose 82 65 - 99 mg/dL   BUN 19 8 - 27 mg/dL   Creatinine, Ser 1.29 (H) 0.76 - 1.27 mg/dL   GFR calc non Af Amer 60 >59 mL/min/1.73   GFR calc Af Amer 69 >59 mL/min/1.73   BUN/Creatinine Ratio 15 10 - 24   Sodium 141 134 - 144 mmol/L   Potassium  5.2 3.5 - 5.2 mmol/L   Chloride 102 96 - 106 mmol/L   CO2 21 18 - 29 mmol/L   Calcium 9.2 8.6 - 10.2 mg/dL      Assessment & Plan:   Problem List Items Addressed This Visit      Cardiovascular and Mediastinum   Hypertension    The current medical regimen is effective;  continue present plan and medications.       Relevant Medications   benazepril (LOTENSIN) 40 MG tablet   Other Relevant Orders   Comprehensive metabolic panel     Musculoskeletal and Integument   Arthritis of both knees    The current medical regimen is effective;  continue present plan and medications.       Relevant Medications   meloxicam (MOBIC) 15 MG tablet   allopurinol (ZYLOPRIM) 300 MG tablet      Other   Acute gout    The current medical regimen is effective;  continue present plan and medications.       Relevant Medications   allopurinol (ZYLOPRIM) 300 MG tablet   Other Relevant Orders   Uric acid    Other Visit Diagnoses    Elevated TSH    -  Primary   Weight on blood work and reassess at that point.   Relevant Orders   TSH   CKD (chronic kidney disease) stage 3, GFR 30-59 ml/min       Relevant Orders   Comprehensive metabolic panel   PE (physical exam), annual       Relevant Orders   CBC with Differential/Platelet   Comprehensive metabolic panel   Lipid Panel w/o Chol/HDL Ratio   PSA   Urinalysis, Routine w reflex microscopic (not at [facility])   Need for diphtheria-tetanus-pertussis (Tdap) vaccine       Relevant Orders   Tdap vaccine greater than or equal to 7yo IM (Completed)   Screening for colon cancer       Relevant Orders   Ambulatory referral to Gastroenterology       Follow up plan: Return in about 6 months (around 07/13/2016) for BMP.

## 2016-01-14 LAB — COMPREHENSIVE METABOLIC PANEL
A/G RATIO: 1.8 (ref 1.2–2.2)
ALBUMIN: 4.4 g/dL (ref 3.6–4.8)
ALK PHOS: 53 IU/L (ref 39–117)
ALT: 23 IU/L (ref 0–44)
AST: 22 IU/L (ref 0–40)
BILIRUBIN TOTAL: 0.4 mg/dL (ref 0.0–1.2)
BUN / CREAT RATIO: 16 (ref 10–24)
BUN: 18 mg/dL (ref 8–27)
CHLORIDE: 99 mmol/L (ref 96–106)
CO2: 24 mmol/L (ref 18–29)
Calcium: 9.2 mg/dL (ref 8.6–10.2)
Creatinine, Ser: 1.14 mg/dL (ref 0.76–1.27)
GFR calc non Af Amer: 69 mL/min/{1.73_m2} (ref >59)
GFR, EST AFRICAN AMERICAN: 80 mL/min/{1.73_m2} (ref >59)
GLUCOSE: 86 mg/dL (ref 65–99)
Globulin, Total: 2.4 g/dL (ref 1.5–4.5)
POTASSIUM: 5.7 mmol/L — AB (ref 3.5–5.2)
Sodium: 139 mmol/L (ref 134–144)
TOTAL PROTEIN: 6.8 g/dL (ref 6.0–8.5)

## 2016-01-14 LAB — CBC WITH DIFFERENTIAL/PLATELET
BASOS: 0 %
Basophils Absolute: 0 10*3/uL (ref 0.0–0.2)
EOS (ABSOLUTE): 0.2 10*3/uL (ref 0.0–0.4)
Eos: 3 %
Hematocrit: 47.4 % (ref 37.5–51.0)
Hemoglobin: 16.7 g/dL (ref 12.6–17.7)
IMMATURE GRANS (ABS): 0 10*3/uL (ref 0.0–0.1)
Immature Granulocytes: 1 %
LYMPHS: 19 %
Lymphocytes Absolute: 1.5 10*3/uL (ref 0.7–3.1)
MCH: 31.7 pg (ref 26.6–33.0)
MCHC: 35.2 g/dL (ref 31.5–35.7)
MCV: 90 fL (ref 79–97)
MONOS ABS: 0.7 10*3/uL (ref 0.1–0.9)
Monocytes: 9 %
NEUTROS ABS: 5.2 10*3/uL (ref 1.4–7.0)
Neutrophils: 68 %
PLATELETS: 235 10*3/uL (ref 150–379)
RBC: 5.26 x10E6/uL (ref 4.14–5.80)
RDW: 13.7 % (ref 12.3–15.4)
WBC: 7.6 10*3/uL (ref 3.4–10.8)

## 2016-01-14 LAB — LIPID PANEL W/O CHOL/HDL RATIO
CHOLESTEROL TOTAL: 207 mg/dL — AB (ref 100–199)
HDL: 68 mg/dL (ref >39)
LDL Calculated: 108 mg/dL — ABNORMAL HIGH (ref 0–99)
Triglycerides: 156 mg/dL — ABNORMAL HIGH (ref 0–149)
VLDL Cholesterol Cal: 31 mg/dL (ref 5–40)

## 2016-01-14 LAB — TSH: TSH: 4.72 u[IU]/mL — AB (ref 0.450–4.500)

## 2016-01-14 LAB — PSA: Prostate Specific Ag, Serum: 1.3 ng/mL (ref 0.0–4.0)

## 2016-01-14 LAB — URIC ACID: URIC ACID: 5.1 mg/dL (ref 3.7–8.6)

## 2016-01-19 ENCOUNTER — Telehealth: Payer: Self-pay | Admitting: Family Medicine

## 2016-01-19 ENCOUNTER — Encounter: Payer: Self-pay | Admitting: Family Medicine

## 2016-01-19 DIAGNOSIS — R7989 Other specified abnormal findings of blood chemistry: Secondary | ICD-10-CM

## 2016-01-19 NOTE — Telephone Encounter (Signed)
-----   Message from Staci Acosta, [place] sent at 01/19/2016 12:12 PM EDT ----- No answer.

## 2016-01-21 ENCOUNTER — Encounter: Payer: Self-pay | Admitting: Family Medicine

## 2016-02-12 DIAGNOSIS — Z86010 Personal history of colonic polyps: Secondary | ICD-10-CM | POA: Diagnosis not present

## 2016-05-13 MED ORDER — SODIUM CHLORIDE 0.9 % IV SOLN: INTRAVENOUS | Status: DC

## 2016-05-14 ENCOUNTER — Ambulatory Visit
Admission: RE | Admit: 2016-05-14 | Discharge: 2016-05-14 | Disposition: A | Discharge status: Home / Self Care | Payer: BLUE CROSS/BLUE SHIELD | Source: Ambulatory Visit | Attending: Unknown Physician Specialty | Admitting: Unknown Physician Specialty

## 2016-05-14 ENCOUNTER — Encounter
Admission: RE | Disposition: A | Discharge status: Home / Self Care | Payer: Self-pay | Source: Ambulatory Visit | Attending: Unknown Physician Specialty

## 2016-05-14 ENCOUNTER — Encounter: Payer: Self-pay | Admitting: *Deleted

## 2016-05-14 ENCOUNTER — Ambulatory Visit: Payer: BLUE CROSS/BLUE SHIELD | Admitting: Anesthesiology

## 2016-05-14 DIAGNOSIS — Z882 Allergy status to sulfonamides status: Secondary | ICD-10-CM | POA: Diagnosis not present

## 2016-05-14 DIAGNOSIS — Z86718 Personal history of other venous thrombosis and embolism: Secondary | ICD-10-CM | POA: Insufficient documentation

## 2016-05-14 DIAGNOSIS — I4891 Unspecified atrial fibrillation: Secondary | ICD-10-CM | POA: Diagnosis not present

## 2016-05-14 DIAGNOSIS — D122 Benign neoplasm of ascending colon: Secondary | ICD-10-CM | POA: Diagnosis not present

## 2016-05-14 DIAGNOSIS — Z86010 Personal history of colonic polyps: Secondary | ICD-10-CM | POA: Insufficient documentation

## 2016-05-14 DIAGNOSIS — Z6832 Body mass index (BMI) 32.0-32.9, adult: Secondary | ICD-10-CM | POA: Diagnosis not present

## 2016-05-14 DIAGNOSIS — D120 Benign neoplasm of cecum: Secondary | ICD-10-CM | POA: Diagnosis not present

## 2016-05-14 DIAGNOSIS — K640 First degree hemorrhoids: Secondary | ICD-10-CM | POA: Diagnosis not present

## 2016-05-14 DIAGNOSIS — Z1211 Encounter for screening for malignant neoplasm of colon: Secondary | ICD-10-CM | POA: Insufficient documentation

## 2016-05-14 DIAGNOSIS — K5730 Diverticulosis of large intestine without perforation or abscess without bleeding: Secondary | ICD-10-CM | POA: Insufficient documentation

## 2016-05-14 DIAGNOSIS — M109 Gout, unspecified: Secondary | ICD-10-CM | POA: Insufficient documentation

## 2016-05-14 DIAGNOSIS — K621 Rectal polyp: Secondary | ICD-10-CM | POA: Diagnosis not present

## 2016-05-14 DIAGNOSIS — Z87891 Personal history of nicotine dependence: Secondary | ICD-10-CM | POA: Diagnosis not present

## 2016-05-14 DIAGNOSIS — I10 Essential (primary) hypertension: Secondary | ICD-10-CM | POA: Diagnosis not present

## 2016-05-14 DIAGNOSIS — J449 Chronic obstructive pulmonary disease, unspecified: Secondary | ICD-10-CM | POA: Insufficient documentation

## 2016-05-14 DIAGNOSIS — D124 Benign neoplasm of descending colon: Secondary | ICD-10-CM | POA: Insufficient documentation

## 2016-05-14 DIAGNOSIS — D127 Benign neoplasm of rectosigmoid junction: Secondary | ICD-10-CM | POA: Diagnosis not present

## 2016-05-14 DIAGNOSIS — K635 Polyp of colon: Secondary | ICD-10-CM | POA: Diagnosis not present

## 2016-05-14 DIAGNOSIS — D128 Benign neoplasm of rectum: Secondary | ICD-10-CM | POA: Diagnosis not present

## 2016-05-14 HISTORY — PX: COLONOSCOPY WITH PROPOFOL: SHX5780

## 2016-05-14 SURGERY — COLONOSCOPY WITH PROPOFOL
Anesthesia: General

## 2016-05-14 MED ORDER — PROPOFOL 10 MG/ML IV BOLUS
INTRAVENOUS | Status: AC
  Filled 2016-05-14: qty 20

## 2016-05-14 MED ORDER — MIDAZOLAM HCL 2 MG/2ML IJ SOLN
INTRAMUSCULAR | Status: DC | PRN
  Administered 2016-05-14: 2 mg via INTRAVENOUS

## 2016-05-14 MED ORDER — MIDAZOLAM HCL 2 MG/2ML IJ SOLN
INTRAMUSCULAR | Status: AC
  Filled 2016-05-14: qty 2

## 2016-05-14 MED ORDER — PHENYLEPHRINE HCL 10 MG/ML IJ SOLN
INTRAMUSCULAR | Status: AC
  Filled 2016-05-14: qty 1

## 2016-05-14 MED ORDER — FLEET ENEMA 7-19 GM/118ML RE ENEM
1.0000 | ENEMA | Freq: Once | RECTAL | Status: AC
  Administered 2016-05-14: 1 via RECTAL

## 2016-05-14 MED ORDER — PROPOFOL 10 MG/ML IV BOLUS
INTRAVENOUS | Status: DC | PRN
  Administered 2016-05-14: 20 mg via INTRAVENOUS
  Administered 2016-05-14: 10 mg via INTRAVENOUS
  Administered 2016-05-14: 40 mg via INTRAVENOUS

## 2016-05-14 MED ORDER — PHENYLEPHRINE HCL 10 MG/ML IJ SOLN
INTRAMUSCULAR | Status: DC | PRN
  Administered 2016-05-14 (×2): 100 ug via INTRAVENOUS

## 2016-05-14 MED ORDER — SODIUM CHLORIDE 0.9 % IV SOLN
INTRAVENOUS | Status: DC
  Administered 2016-05-14: 1000 mL via INTRAVENOUS

## 2016-05-14 MED ORDER — SODIUM CHLORIDE 0.9 % IV SOLN: INTRAVENOUS | Status: DC

## 2016-05-14 MED ORDER — LIDOCAINE HCL (PF) 2 % IJ SOLN
INTRAMUSCULAR | Status: AC
  Filled 2016-05-14: qty 2

## 2016-05-14 MED ORDER — PROPOFOL 500 MG/50ML IV EMUL
INTRAVENOUS | Status: DC | PRN
  Administered 2016-05-14: 120 ug/kg/min via INTRAVENOUS

## 2016-05-14 NOTE — H&P (Signed)
Primary Care Physician:  Golden Pop, MD Primary Gastroenterologist:  Dr. Vira Agar  Pre-Procedure History & Physical: HPI:  Travis Diaz is a 61 y.o. male is here for an colonoscopy.   Past Medical History:  Diagnosis Date  . Gout   . Hypertension     Past Surgical History:  Procedure Laterality Date  . broken arm    . upper leg procedure Right    removal of blood clots    Prior to Admission medications   Medication Sig Start Date End Date Taking? Authorizing Provider  allopurinol (ZYLOPRIM) 300 MG tablet Take 1 tablet (300 mg total) by mouth daily. 01/13/16  Yes Guadalupe Maple, MD  benazepril (LOTENSIN) 40 MG tablet Take 1 tablet (40 mg total) by mouth daily. 01/13/16  Yes Guadalupe Maple, MD  Flaxseed, Linseed, (FLAXSEED OIL) 1000 MG CAPS Take by mouth.    Historical Provider, MD  Folic Acid 20 MG CAPS Take by mouth.    Historical Provider, MD  meloxicam (MOBIC) 15 MG tablet Take 1 tablet (15 mg total) by mouth daily. 01/13/16   Guadalupe Maple, MD  vitamin E 400 UNIT capsule Take by mouth.    Historical Provider, MD    Allergies as of 04/22/2016 - Review Complete 01/13/2016  Allergen Reaction Noted  . Sulfur Other (See Comments) 09/20/2014    Family History  Problem Relation Age of Onset  . Cancer Mother     male  . Heart disease Father   . Cancer Father   . Early death Father   . Heart attack Father   . Diabetes Sister   . Dementia Maternal Grandmother   . Heart attack Maternal Grandfather   . Heart attack Paternal Grandmother   . Early death Paternal Grandfather   . Heart attack Paternal Grandfather     Social History   Social History  . Marital status: Married    Spouse name: N/A  . Number of children: N/A  . Years of education: N/A   Occupational History  . Not on file.   Social History Main Topics  . Smoking status: Former Smoker    Types: Cigarettes    Quit date: 03/29/2004  . Smokeless tobacco: Never Used  . Alcohol use 14.4  oz/week    24 Cans of beer per week  . Drug use: No  . Sexual activity: Yes   Other Topics Concern  . Not on file   Social History Narrative  . No narrative on file    Review of Systems: See HPI, otherwise negative ROS  Physical Exam: BP (!) 167/107   Pulse 90   Temp 97.1 F (36.2 C) (Tympanic)   Resp 18   Ht 5\' 11"  (1.803 m)   Wt 106.6 kg (235 lb)   SpO2 99%   BMI 32.78 kg/m  General:   Alert,  pleasant and cooperative in NAD Head:  Normocephalic and atraumatic. Neck:  Supple; no masses or thyromegaly. Lungs:  Clear throughout to auscultation.    Heart:  Regular rate and rhythm. Abdomen:  Soft, nontender and nondistended. Normal bowel sounds, without guarding, and without rebound.   Neurologic:  Alert and  oriented x4;  grossly normal neurologically.  Impression/Plan: Travis Diaz is here for an colonoscopy to be performed for [facility] colon polyps  Risks, benefits, limitations, and alternatives regarding  colonoscopy have been reviewed with the patient.  Questions have been answered.  All parties agreeable.   Gaylyn Cheers, MD  05/14/2016, 10:37  AM

## 2016-05-14 NOTE — Anesthesia Post-op Follow-up Note (Signed)
Anesthesia QCDR form completed.        

## 2016-05-14 NOTE — Transfer of Care (Signed)
Immediate Anesthesia Transfer of Care Note  Patient: Travis Diaz  Procedure(s) Performed: Procedure(s): COLONOSCOPY WITH PROPOFOL (N/A)  Patient Location: PACU  Anesthesia Type:General  Level of Consciousness: awake and alert   Airway & Oxygen Therapy: Patient Spontanous Breathing and Patient connected to nasal cannula oxygen  Post-op Assessment: Report given to RN and Post -op Vital signs reviewed and stable  Post vital signs: Reviewed  Last Vitals:  Vitals:   05/14/16 1007  BP: (!) 167/107  Pulse: 90  Resp: 18  Temp: 36.2 C    Last Pain:  Vitals:   05/14/16 1007  TempSrc: Tympanic         Complications: No apparent anesthesia complications

## 2016-05-14 NOTE — Op Note (Signed)
[facility] Gastroenterology Patient Name: Travis Diaz Procedure Date: 05/14/2016 10:44 AM MRN: [id number] Account #: [id number] Date of Birth: April 14, 1955 Admit Type: Outpatient Age: 61 Room: [facility] ENDO ROOM 1 Gender: Male Note Status: Finalized Procedure:            Colonoscopy Indications:          High risk colon cancer surveillance: Personal history                        of colonic polyps Providers:            Manya Silvas, MD Referring MD:         Guadalupe Maple, MD (Referring MD) Medicines:            Propofol per Anesthesia Complications:        No immediate complications. Procedure:            Pre-Anesthesia Assessment:                       - After reviewing the risks and benefits, the patient                        was deemed in satisfactory condition to undergo the                        procedure.                       After obtaining informed consent, the colonoscope was                        passed under direct vision. Throughout the procedure,                        the patient's blood pressure, pulse, and oxygen                        saturations were monitored continuously. The                        Colonoscope was introduced through the anus and                        advanced to the the cecum, identified by appendiceal                        orifice and ileocecal valve. The colonoscopy was                        performed without difficulty. The patient tolerated the                        procedure well. The quality of the bowel preparation                        was good. Findings:      A small polyp was found in the ascending colon. The polyp was sessile.       The polyp was removed with a hot snare. Resection and retrieval were       complete.      A small polyp was found  in the ascending colon. The polyp was sessile.       The polyp was removed with a hot snare. Resection and retrieval were       complete.      Four sessile  polyps were found in the ascending colon. The polyps were       diminutive in size. These polyps were removed with a jumbo cold forceps.       Resection and retrieval were complete.      Two sessile polyps were found in the proximal ascending colon. The       polyps were large in size. These polyps were removed with a hot snare.       Resection and retrieval were complete.      A small polyp was found in the descending colon. The polyp was sessile.       The polyp was removed with a hot snare. Resection and retrieval were       complete.      Two sessile polyps were found in the descending colon. The polyps were       diminutive in size. These polyps were removed with a jumbo cold forceps.       Resection and retrieval were complete.      Four sessile polyps were found in the recto-sigmoid colon. The polyps       were diminutive in size. These polyps were removed with a jumbo cold       forceps. Resection and retrieval were complete.      A small polyp was found in the rectum. The polyp was sessile. The polyp       was removed with a hot snare. Resection and retrieval were complete.      Multiple small-mouthed diverticula were found in the sigmoid colon and       descending colon.      3 clips applied in ascending site and 1 in transverse, 1 in ascending       colon.      Internal hemorrhoids were found during endoscopy. The hemorrhoids were       small and Grade I (internal hemorrhoids that do not prolapse). Impression:           - One small polyp in the ascending colon, removed with                        a hot snare. Resected and retrieved.                       - One small polyp in the ascending colon, removed with                        a hot snare. Resected and retrieved.                       - Four diminutive polyps in the ascending colon,                        removed with a jumbo cold forceps. Resected and                        retrieved.                       - Two large  polyps in the  proximal ascending colon,                        removed with a hot snare. Resected and retrieved.                       - One small polyp in the descending colon, removed with                        a hot snare. Resected and retrieved.                       - Two diminutive polyps in the descending colon,                        removed with a jumbo cold forceps. Resected and                        retrieved.                       - Four diminutive polyps at the recto-sigmoid colon,                        removed with a jumbo cold forceps. Resected and                        retrieved.                       - One small polyp in the rectum, removed with a hot                        snare. Resected and retrieved.                       - Diverticulosis in the sigmoid colon and in the                        descending colon.                       - Internal hemorrhoids. Recommendation:       - Await pathology results. Manya Silvas, MD 05/14/2016 12:09:35 PM This report has been signed electronically. Number of Addenda: 0 Note Initiated On: 05/14/2016 10:44 AM Scope Withdrawal Time: 0 hours 54 minutes 1 second  Total Procedure Duration: 1 hour 9 minutes 42 seconds       [facility]

## 2016-05-14 NOTE — Anesthesia Preprocedure Evaluation (Addendum)
Anesthesia Evaluation  Patient identified by MRN, date of birth, ID band Patient awake    Reviewed: Allergy & Precautions, NPO status , Patient's Chart, lab work & pertinent test results  Airway Mallampati: III       Dental  (+) Teeth Intact   Pulmonary COPD, former smoker,     + decreased breath sounds      Cardiovascular Exercise Tolerance: Good hypertension, Pt. on medications + dysrhythmias Atrial Fibrillation  Rhythm:Irregular Rate:Abnormal     Neuro/Psych    GI/Hepatic negative GI ROS, Neg liver ROS,   Endo/Other  Morbid obesity  Renal/GU negative Renal ROS     Musculoskeletal   Abdominal (+) + obese,   Peds  Hematology   Anesthesia Other Findings   Reproductive/Obstetrics                            Anesthesia Physical Anesthesia Plan  ASA: III  Anesthesia Plan: General   Post-op Pain Management:    Induction: Intravenous  Airway Management Planned: Natural Airway  Additional Equipment:   Intra-op Plan:   Post-operative Plan:   Informed Consent: I have reviewed the patients History and Physical, chart, labs and discussed the procedure including the risks, benefits and alternatives for the proposed anesthesia with the patient or authorized representative who has indicated his/her understanding and acceptance.     Plan Discussed with: Surgeon  Anesthesia Plan Comments:        Anesthesia Quick Evaluation

## 2016-05-17 ENCOUNTER — Encounter: Payer: Self-pay | Admitting: Unknown Physician Specialty

## 2016-05-17 LAB — SURGICAL PATHOLOGY

## 2016-05-19 NOTE — Anesthesia Postprocedure Evaluation (Signed)
Anesthesia Post Note  Patient: Travis Diaz  Procedure(s) Performed: Procedure(s) (LRB): COLONOSCOPY WITH PROPOFOL (N/A)  Patient location during evaluation: PACU Anesthesia Type: General Level of consciousness: awake Pain management: pain level controlled Vital Signs Assessment: post-procedure vital signs reviewed and stable Respiratory status: spontaneous breathing Cardiovascular status: stable Anesthetic complications: no     Last Vitals:  Vitals:   05/14/16 1223 05/14/16 1233  BP: (!) 126/99 (!) 146/106  Pulse: 70   Resp: 16   Temp:      Last Pain:  Vitals:   05/16/16 1053  TempSrc:   PainSc: 0-No pain                 VAN STAVEREN,Starlette Thurow

## 2016-07-13 ENCOUNTER — Ambulatory Visit (INDEPENDENT_AMBULATORY_CARE_PROVIDER_SITE_OTHER): Payer: BLUE CROSS/BLUE SHIELD | Admitting: Family Medicine

## 2016-07-13 ENCOUNTER — Encounter: Payer: Self-pay | Admitting: Family Medicine

## 2016-07-13 VITALS — BP 125/87 | HR 88 | Ht 71.0 in | Wt 242.0 lb

## 2016-07-13 DIAGNOSIS — I10 Essential (primary) hypertension: Secondary | ICD-10-CM

## 2016-07-13 DIAGNOSIS — M1000 Idiopathic gout, unspecified site: Secondary | ICD-10-CM | POA: Diagnosis not present

## 2016-07-13 DIAGNOSIS — M170 Bilateral primary osteoarthritis of knee: Secondary | ICD-10-CM

## 2016-07-13 NOTE — Progress Notes (Addendum)
BP 125/87   Pulse 88   Ht 5\' 11"  (1.803 m)   Wt 242 lb (109.8 kg)   SpO2 99%   BMI 33.75 kg/m    Subjective:    Patient ID: Travis Diaz, male    DOB: [birth year], 61 y.o.   MRN: [id number]  HPI: Travis Diaz is a 61 y.o. male  Chief Complaint  Patient presents with  . Follow-up  Follow-up blood pressure doing well no complaints from medications. Also follow-up gout no complaints taking allopurinol without problems no further gout episodes. Discussed elevated weight patient has been wearing his steel toed shoes for work and has them on today.  Relevant past medical, surgical, family and social history reviewed and updated as indicated. Interim medical history since our last visit reviewed. Allergies and medications reviewed and updated.  Review of Systems  Constitutional: Negative.   Respiratory: Negative.   Cardiovascular: Negative.     Per HPI unless specifically indicated above     Objective:    BP 125/87   Pulse 88   Ht 5\' 11"  (1.803 m)   Wt 242 lb (109.8 kg)   SpO2 99%   BMI 33.75 kg/m   Wt Readings from Last 3 Encounters:  07/13/16 242 lb (109.8 kg)  05/14/16 235 lb (106.6 kg)  01/13/16 232 lb (105.2 kg)    Physical Exam  Constitutional: He is oriented to person, place, and time. He appears well-developed and well-nourished.  HENT:  Head: Normocephalic and atraumatic.  Eyes: Conjunctivae and EOM are normal.  Neck: Normal range of motion.  Cardiovascular: Normal rate, regular rhythm and normal heart sounds.   Pulmonary/Chest: Effort normal and breath sounds normal.  Musculoskeletal: Normal range of motion.  Neurological: He is alert and oriented to person, place, and time.  Skin: No erythema.  Psychiatric: He has a normal mood and affect. His behavior is normal. Judgment and thought content normal.    Results for orders placed or performed during the hospital encounter of 05/14/16  Surgical pathology  Result Value Ref Range   SURGICAL PATHOLOGY      Surgical Pathology CASE: [id number] PATIENT: Arby Barrette Surgical Pathology Report     SPECIMEN SUBMITTED: A. Colon polyp, ascending; hot snare B. Colon polyp, ascending; hot snare C. Colon polyp x4, proximal ascending; cbx D. Colon polyp, proximal ascending E. Colon polyp, descending; hot snare F. Colon polyp x2, descending; cbx G. Colon polyp x4, recto sigmoid; cbx H. Rectum polyp x2; hot and cold snare  CLINICAL HISTORY: None provided  PRE-OPERATIVE DIAGNOSIS: Personal HX of colon polyps  POST-OPERATIVE DIAGNOSIS: Diverticulosis, colon polyps     DIAGNOSIS: A. COLON POLYP, ASCENDING; HOT SNARE: - TUBULAR ADENOMA. - NEGATIVE FOR HIGH GRADE DYSPLASIA AND MALIGNANCY.  B. COLON POLYP, ASCENDING; HOT SNARE: - TUBULAR ADENOMA. - NEGATIVE FOR HIGH GRADE DYSPLASIA AND MALIGNANCY.  C. COLON POLYP 4, PROXIMAL ASCENDING; COLD BIOPSY: - TUBULAR ADENOMA (4). - NEGATIVE FOR HIGH-GRADE DYSPLASIA AND MALIGNANCY.  D. COLON POLYP, PROXIMAL ASCENDING  AND ILEOCECAL VALVE; COLD BIOPSY: - TWO TUBULAR ADENOMAS, THE LARGEST WITH FOCAL SUPERFICIAL HIGH GRADE DYSPLASIA. - CAUTERIZED BASE FREE OF DYSPLASIA.  E. COLON POLYP, DESCENDING; HOT SNARE: - TUBULAR ADENOMA. - NEGATIVE FOR HIGH GRADE DYSPLASIA AND MALIGNANCY.  F. COLON POLYP 2, DESCENDING; COLD BIOPSY: - TUBULAR ADENOMA (2). - NEGATIVE FOR HIGH-GRADE DYSPLASIA AND MALIGNANCY.  G. COLON POLYP 4, RECTOSIGMOID; COLD BIOPSY: - HYPERPLASTIC POLYP (4). - NEGATIVE FOR DYSPLASIA AND MALIGNANCY.  H. RECTUM POLYP 2; HOT AND  COLD SNARE: - HYPERPLASTIC POLYP (2). - NEGATIVE FOR DYSPLASIA AND MALIGNANCY.   GROSS DESCRIPTION:  A. Labeled: ascending colon polyp hot snare  Tissue fragment(s): 1  Size: 0.9 cm  Description: pink-tan pedunculated polyp, inked blue  Entirely submitted in one cassette(s).   B. Labeled: ascending colon polyp hot snare  Tissue fragment(s): multiple  Size:  aggregate, 2.0 x 1.0 x 0.1 cm  Description: pink to tan fragments  Entir ely submitted in one cassette(s).  C. Labeled: proximal ascending colon C BX 4  Tissue fragment(s): multiple  Size: aggregate, 1.1 x 0.4 x 0.1 cm  Description: tan fragments and fecal material  Entirely submitted in one cassette(s).  D. Labeled: proximal ascending colon polyp and ileocecal valve polyp hot snare total 2 polyp  Tissue fragment(s): 3 polypoid fragments 0.5-1.2 cm in greatest dimension and a 1.0 x 0.1 x 0.1 cm aggregate of fecal material with possible tissue fragments  Description: 3 polypoid red fragments differentially inked and 2 largest fragments bisected  Block summary 1-Aggregate of fecal material and smallest polypoid fragment 2-2 largest fragments bisected   E. Labeled: descending colon polyp hot snare  Tissue fragment(s): 1  Size: 0.6 cm  Description: pink polypoid fragment, inked blue at the base  Entirely submitted in 1 cassette(s).  F. Labeled: descending colon polyp C BX 2  Tissue fragment(s): 2  Size: 0.2 and 0.3 cm   Description: pink-tan fragments  Entirely submitted in 1 cassette(s).  G. Labeled: rectosigmoid colon polyp C BX 4  Tissue fragment(s): 4  Size: 0.2-0.3 cm  Description: pink-tan fragments  Entirely submitted in one cassette(s).  H. Labeled: rectal colon polyp hot and cold snare 2  Tissue fragment(s): multiple  Size: aggregate, 0.9 x 0.5 x 0.1 cm  Description: pink fragments and fecal material  Entirely submitted in one cassette(s).   Final Diagnosis performed by Quay Burow, MD.  Electronically signed 05/17/2016 9:31:46AM    The electronic signature indicates that the named Attending Pathologist has evaluated the specimen  Technical component performed at [facility], [address] [place], [place] [address] Lab: [phone] Dir: Darrick Penna. Evette Doffing, MD  Professional component performed at [facility], [facility], [place], [place], [place] [address] Lab: [phone] Dir: Dellia Nims. Reuel Derby, MD        Assessment & Plan:   Problem List Items Addressed This Visit      Cardiovascular and Mediastinum   Hypertension - Primary    The current medical regimen is effective;  continue present plan and medications.       Relevant Orders   Basic metabolic panel     Musculoskeletal and Integument   Arthritis of both knees    Stable filled out FMLA form        Other   Acute gout    The current medical regimen is effective;  continue present plan and medications.           Follow up plan: Return in about 6 months (around 01/12/2017) for Physical Exam and uric acid.

## 2016-07-13 NOTE — Assessment & Plan Note (Signed)
The current medical regimen is effective;  continue present plan and medications.  

## 2016-07-13 NOTE — Assessment & Plan Note (Signed)
Stable filled out FMLA form

## 2016-07-14 ENCOUNTER — Encounter: Payer: Self-pay | Admitting: Family Medicine

## 2016-07-14 LAB — BASIC METABOLIC PANEL
BUN/Creatinine Ratio: 20 (ref 10–24)
BUN: 24 mg/dL (ref 8–27)
CALCIUM: 8.6 mg/dL (ref 8.6–10.2)
CO2: 23 mmol/L (ref 18–29)
Chloride: 100 mmol/L (ref 96–106)
Creatinine, Ser: 1.19 mg/dL (ref 0.76–1.27)
GFR calc Af Amer: 76 mL/min/{1.73_m2} (ref >59)
GFR calc non Af Amer: 66 mL/min/{1.73_m2} (ref >59)
Glucose: 81 mg/dL (ref 65–99)
POTASSIUM: 4.8 mmol/L (ref 3.5–5.2)
SODIUM: 140 mmol/L (ref 134–144)

## 2016-07-14 NOTE — Progress Notes (Signed)
done

## 2017-01-15 ENCOUNTER — Other Ambulatory Visit: Payer: Self-pay | Admitting: Family Medicine

## 2017-01-15 DIAGNOSIS — I10 Essential (primary) hypertension: Secondary | ICD-10-CM

## 2017-01-15 DIAGNOSIS — M1000 Idiopathic gout, unspecified site: Secondary | ICD-10-CM

## 2017-01-17 ENCOUNTER — Encounter: Payer: Self-pay | Admitting: Family Medicine

## 2017-01-17 ENCOUNTER — Ambulatory Visit (INDEPENDENT_AMBULATORY_CARE_PROVIDER_SITE_OTHER): Payer: BLUE CROSS/BLUE SHIELD | Admitting: Family Medicine

## 2017-01-17 VITALS — BP 116/76 | HR 62 | Ht 70.47 in | Wt 236.0 lb

## 2017-01-17 DIAGNOSIS — Z114 Encounter for screening for human immunodeficiency virus [HIV]: Secondary | ICD-10-CM

## 2017-01-17 DIAGNOSIS — Z1159 Encounter for screening for other viral diseases: Secondary | ICD-10-CM | POA: Diagnosis not present

## 2017-01-17 DIAGNOSIS — I10 Essential (primary) hypertension: Secondary | ICD-10-CM

## 2017-01-17 DIAGNOSIS — Z0000 Encounter for general adult medical examination without abnormal findings: Secondary | ICD-10-CM | POA: Diagnosis not present

## 2017-01-17 DIAGNOSIS — M1000 Idiopathic gout, unspecified site: Secondary | ICD-10-CM

## 2017-01-17 DIAGNOSIS — M170 Bilateral primary osteoarthritis of knee: Secondary | ICD-10-CM

## 2017-01-17 DIAGNOSIS — Z23 Encounter for immunization: Secondary | ICD-10-CM

## 2017-01-17 LAB — URINALYSIS, ROUTINE W REFLEX MICROSCOPIC
BILIRUBIN UA: NEGATIVE
Glucose, UA: NEGATIVE
Ketones, UA: NEGATIVE
Leukocytes, UA: NEGATIVE
Nitrite, UA: NEGATIVE
PH UA: 5.5 (ref 5.0–7.5)
PROTEIN UA: NEGATIVE
RBC UA: NEGATIVE
SPEC GRAV UA: 1.025 (ref 1.005–1.030)
Urobilinogen, Ur: 0.2 mg/dL (ref 0.2–1.0)

## 2017-01-17 MED ORDER — MELOXICAM 15 MG PO TABS: 15.0000 mg | ORAL_TABLET | Freq: Every day | ORAL | 3 refills | Status: DC

## 2017-01-17 NOTE — Assessment & Plan Note (Signed)
The current medical regimen is effective;  continue present plan and medications.  

## 2017-01-17 NOTE — Assessment & Plan Note (Signed)
Discussed care and treatment weight loss arthritis medications Tylenol also filled out FMLA for his knees.

## 2017-01-17 NOTE — Progress Notes (Signed)
BP 116/76   Pulse 62   Ht 5' 10.47" (1.79 m)   Wt 236 lb (107 kg)   SpO2 95%   BMI 33.41 kg/m    Subjective:    Patient ID: Travis Diaz, male    DOB: [birth year], 61 y.o.   MRN: [id number]  HPI: Travis Diaz is a 61 y.o. male  Chief Complaint  Patient presents with  . Annual Exam  Patient follow-up hypertension doing well Benzapril without issues or concerns good blood pressure control. Meloxicam flaxseed oil does well for arthritis had to stop for colonoscopy and arthritis of his knees flared up but otherwise is back well controlled. Allopurinol's doing okay with no gout flares or gout symptoms. Is bothered by some umbilical hernia changes which is more of the presence than a bother. But nothing painful or irritating.  Relevant past medical, surgical, family and social history reviewed and updated as indicated. Interim medical history since our last visit reviewed. Allergies and medications reviewed and updated.  Review of Systems  Constitutional: Negative.   HENT: Negative.   Eyes: Negative.   Respiratory: Negative.   Cardiovascular: Negative.   Gastrointestinal: Negative.   Endocrine: Negative.   Genitourinary: Negative.   Musculoskeletal: Negative.   Skin: Negative.   Allergic/Immunologic: Negative.   Neurological: Negative.   Hematological: Negative.   Psychiatric/Behavioral: Negative.     Per HPI unless specifically indicated above     Objective:    BP 116/76   Pulse 62   Ht 5' 10.47" (1.79 m)   Wt 236 lb (107 kg)   SpO2 95%   BMI 33.41 kg/m   Wt Readings from Last 3 Encounters:  01/17/17 236 lb (107 kg)  07/13/16 242 lb (109.8 kg)  05/14/16 235 lb (106.6 kg)    Physical Exam  Constitutional: He is oriented to person, place, and time. He appears well-developed and well-nourished.  HENT:  Head: Normocephalic and atraumatic.  Right Ear: External ear normal.  Left Ear: External ear normal.  Eyes: Pupils are equal, round, and  reactive to light. Conjunctivae and EOM are normal.  Neck: Normal range of motion. Neck supple.  Cardiovascular: Normal rate, regular rhythm, normal heart sounds and intact distal pulses.   Pulmonary/Chest: Effort normal and breath sounds normal.  Abdominal: Soft. Bowel sounds are normal. There is no splenomegaly or hepatomegaly.  Genitourinary: Rectum normal, prostate normal and penis normal.  Musculoskeletal: Normal range of motion.  DJD knees  Neurological: He is alert and oriented to person, place, and time. He has normal reflexes.  Skin: No rash noted. No erythema.  Psychiatric: He has a normal mood and affect. His behavior is normal. Judgment and thought content normal.    Results for orders placed or performed in visit on 56/43/32  Basic metabolic panel  Result Value Ref Range   Glucose 81 65 - 99 mg/dL   BUN 24 8 - 27 mg/dL   Creatinine, Ser 1.19 0.76 - 1.27 mg/dL   GFR calc non Af Amer 66 >59 mL/min/1.73   GFR calc Af Amer 76 >59 mL/min/1.73   BUN/Creatinine Ratio 20 10 - 24   Sodium 140 134 - 144 mmol/L   Potassium 4.8 3.5 - 5.2 mmol/L   Chloride 100 96 - 106 mmol/L   CO2 23 18 - 29 mmol/L   Calcium 8.6 8.6 - 10.2 mg/dL      Assessment & Plan:   Problem List Items Addressed This Visit      Cardiovascular and  Mediastinum   Hypertension - Primary    The current medical regimen is effective;  continue present plan and medications.       Relevant Orders   CBC with Differential/Platelet     Musculoskeletal and Integument   Arthritis of both knees    Discussed care and treatment weight loss arthritis medications Tylenol also filled out FMLA for his knees.      Relevant Medications   meloxicam (MOBIC) 15 MG tablet     Other   Acute gout    The current medical regimen is effective;  continue present plan and medications.       Relevant Orders   Uric acid    Other Visit Diagnoses    Needs flu shot       Relevant Orders   Flu Vaccine QUAD 36+ mos IM    Encounter for screening for HIV       Relevant Orders   HIV antibody   Need for hepatitis C screening test       Relevant Orders   Hepatitis C antibody   PE (physical exam), annual       Relevant Orders   CBC with Differential/Platelet   Comprehensive metabolic panel   Lipid panel   PSA   TSH   Urinalysis, Routine w reflex microscopic   Hepatitis C antibody   HIV antibody       Follow up plan: Return in about 6 months (around 07/18/2017) for BMP.

## 2017-01-18 LAB — COMPREHENSIVE METABOLIC PANEL
A/G RATIO: 2.1 (ref 1.2–2.2)
ALK PHOS: 54 IU/L (ref 39–117)
ALT: 25 IU/L (ref 0–44)
AST: 17 IU/L (ref 0–40)
Albumin: 4.6 g/dL (ref 3.6–4.8)
BILIRUBIN TOTAL: 0.4 mg/dL (ref 0.0–1.2)
BUN/Creatinine Ratio: 18 (ref 10–24)
BUN: 22 mg/dL (ref 8–27)
CHLORIDE: 101 mmol/L (ref 96–106)
CO2: 24 mmol/L (ref 20–29)
Calcium: 9.1 mg/dL (ref 8.6–10.2)
Creatinine, Ser: 1.21 mg/dL (ref 0.76–1.27)
GFR calc Af Amer: 74 mL/min/{1.73_m2} (ref >59)
GFR calc non Af Amer: 64 mL/min/{1.73_m2} (ref >59)
Globulin, Total: 2.2 g/dL (ref 1.5–4.5)
Glucose: 85 mg/dL (ref 65–99)
POTASSIUM: 4.9 mmol/L (ref 3.5–5.2)
Sodium: 139 mmol/L (ref 134–144)
Total Protein: 6.8 g/dL (ref 6.0–8.5)

## 2017-01-18 LAB — CBC WITH DIFFERENTIAL/PLATELET
BASOS ABS: 0 10*3/uL (ref 0.0–0.2)
BASOS: 0 %
EOS (ABSOLUTE): 0.2 10*3/uL (ref 0.0–0.4)
Eos: 3 %
HEMOGLOBIN: 16.1 g/dL (ref 13.0–17.7)
Hematocrit: 46.3 % (ref 37.5–51.0)
Immature Grans (Abs): 0.1 10*3/uL (ref 0.0–0.1)
Immature Granulocytes: 1 %
LYMPHS ABS: 1.4 10*3/uL (ref 0.7–3.1)
Lymphs: 18 %
MCH: 31.2 pg (ref 26.6–33.0)
MCHC: 34.8 g/dL (ref 31.5–35.7)
MCV: 90 fL (ref 79–97)
Monocytes Absolute: 0.8 10*3/uL (ref 0.1–0.9)
Monocytes: 10 %
NEUTROS ABS: 5.3 10*3/uL (ref 1.4–7.0)
Neutrophils: 68 %
PLATELETS: 230 10*3/uL (ref 150–379)
RBC: 5.16 x10E6/uL (ref 4.14–5.80)
RDW: 14.1 % (ref 12.3–15.4)
WBC: 7.7 10*3/uL (ref 3.4–10.8)

## 2017-01-18 LAB — URIC ACID: URIC ACID: 4.6 mg/dL (ref 3.7–8.6)

## 2017-01-18 LAB — HEPATITIS C ANTIBODY

## 2017-01-18 LAB — LIPID PANEL
Chol/HDL Ratio: 3.5 ratio (ref 0.0–5.0)
Cholesterol, Total: 221 mg/dL — ABNORMAL HIGH (ref 100–199)
HDL: 63 mg/dL (ref >39)
LDL Calculated: 107 mg/dL — ABNORMAL HIGH (ref 0–99)
TRIGLYCERIDES: 257 mg/dL — AB (ref 0–149)
VLDL Cholesterol Cal: 51 mg/dL — ABNORMAL HIGH (ref 5–40)

## 2017-01-18 LAB — HIV ANTIBODY (ROUTINE TESTING W REFLEX): HIV SCREEN 4TH GENERATION: NONREACTIVE

## 2017-01-18 LAB — PSA: Prostate Specific Ag, Serum: 1.5 ng/mL (ref 0.0–4.0)

## 2017-01-18 LAB — TSH: TSH: 9.21 u[IU]/mL — ABNORMAL HIGH (ref 0.450–4.500)

## 2017-01-21 ENCOUNTER — Encounter: Payer: Self-pay | Admitting: Family Medicine

## 2017-01-21 ENCOUNTER — Telehealth: Payer: Self-pay | Admitting: Family Medicine

## 2017-01-21 DIAGNOSIS — R7989 Other specified abnormal findings of blood chemistry: Secondary | ICD-10-CM

## 2017-01-21 NOTE — Telephone Encounter (Signed)
Phone call Discussed with patient elevated TSH will recheck in about a month.

## 2017-02-18 IMAGING — CR DG WRIST COMPLETE 3+V*R*
1 series · 4 of 4 positions shown · non-contrast
Comparison: None.

CLINICAL DATA: Chronic bilateral wrist pain.

EXAM:
RIGHT WRIST - COMPLETE 3+ VIEW

[Series 1: pa · 0.17mm/px · 4 of 4 slices shown]
[im 1/4]
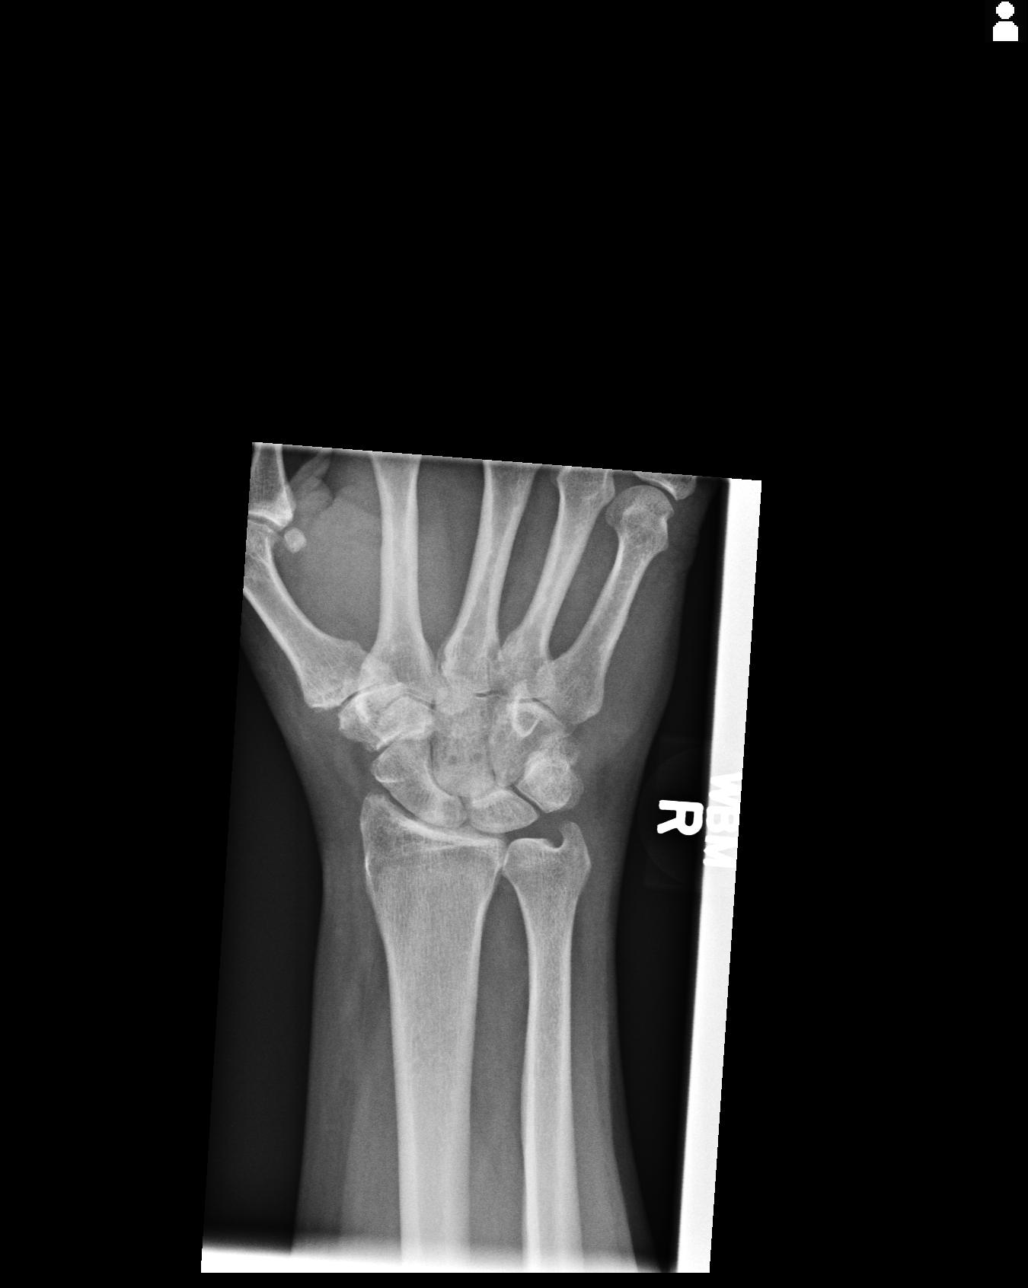
[im 2/4]
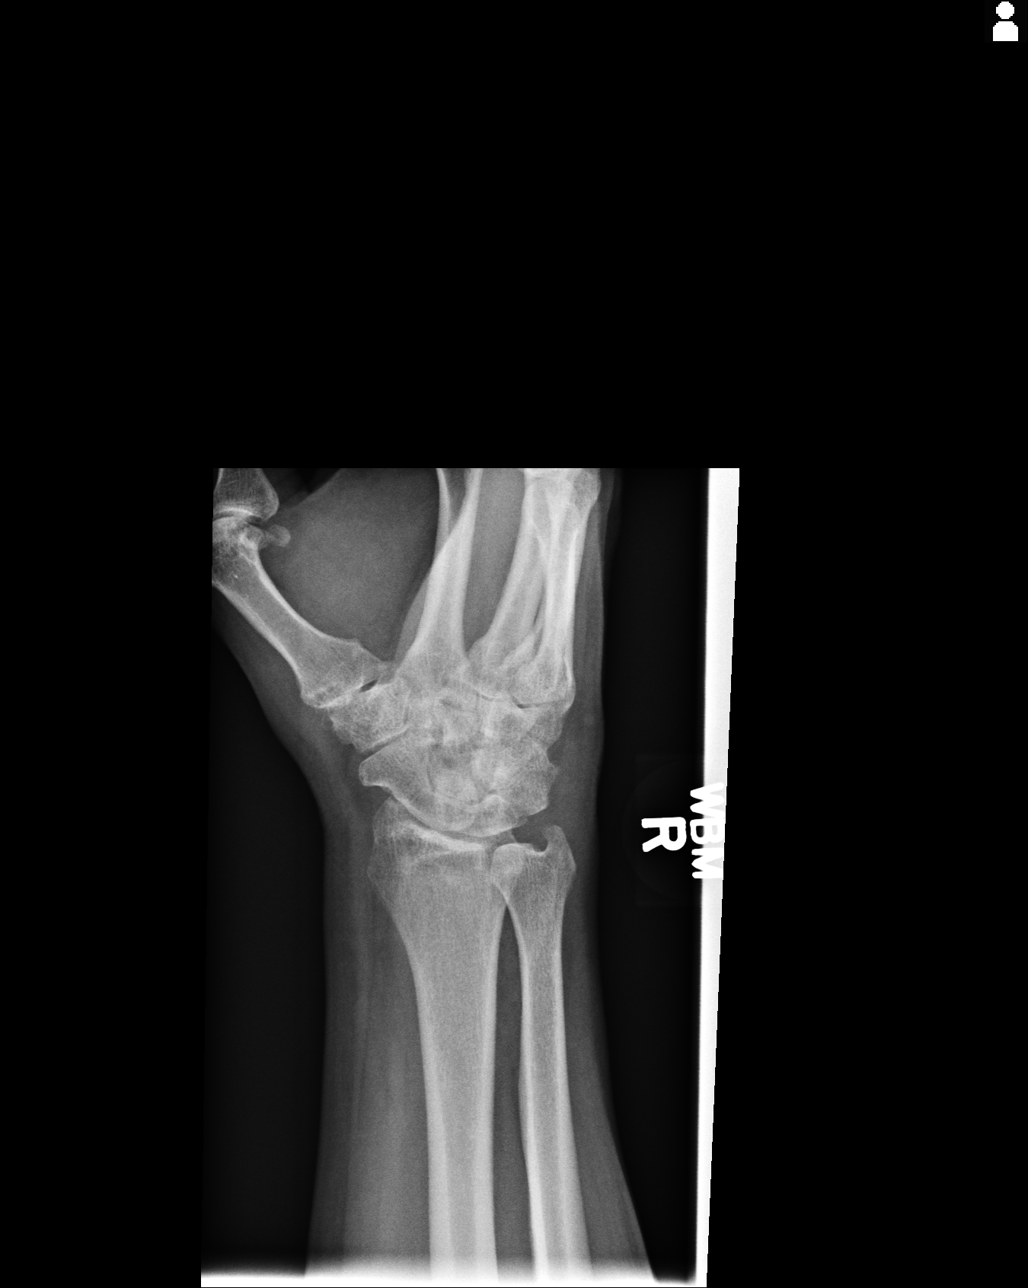
[im 3/4]
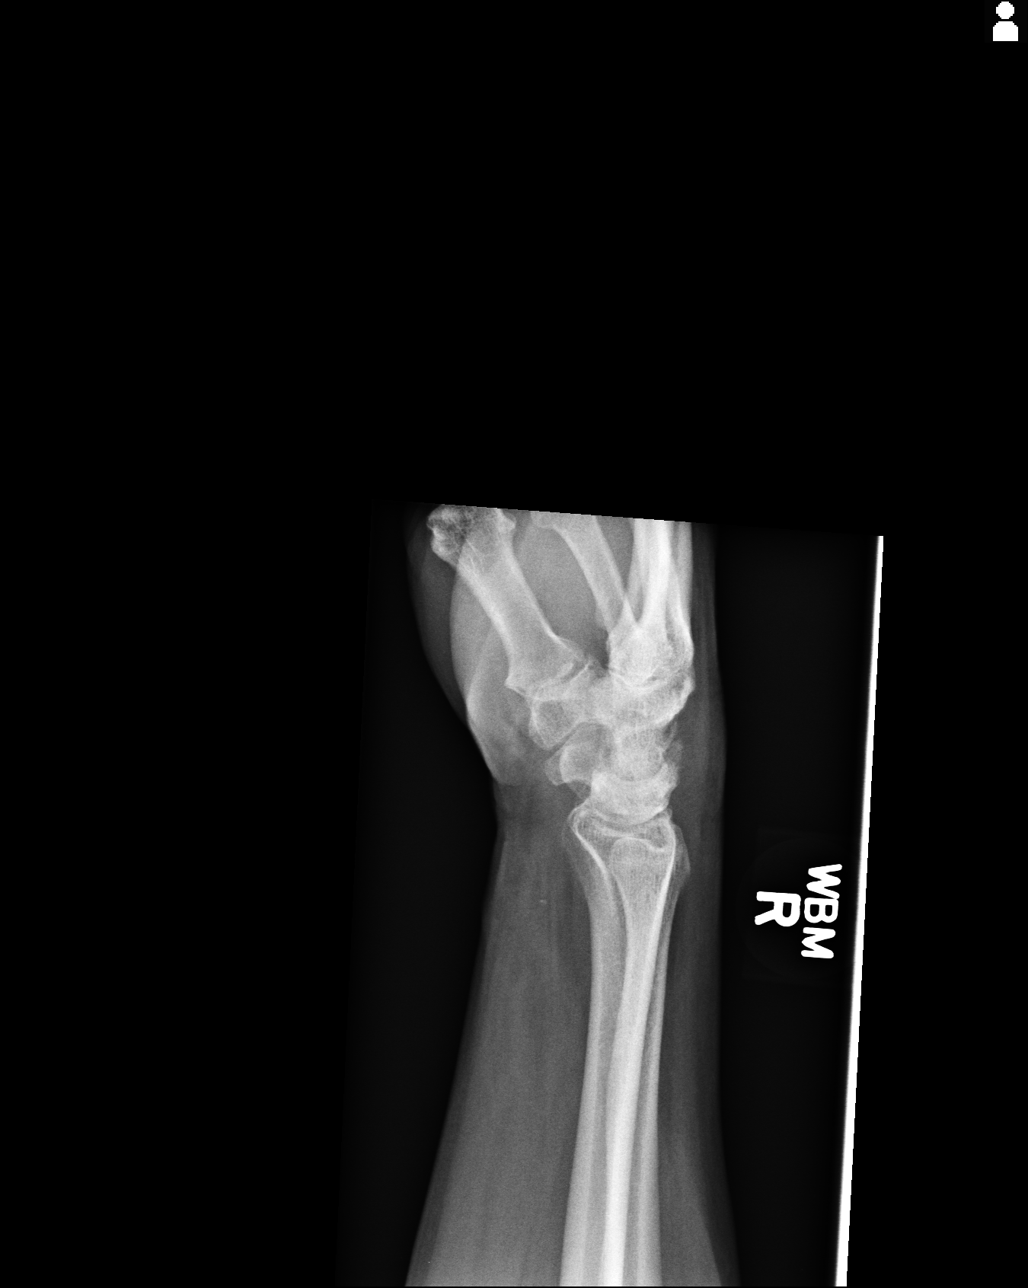
[im 4/4]
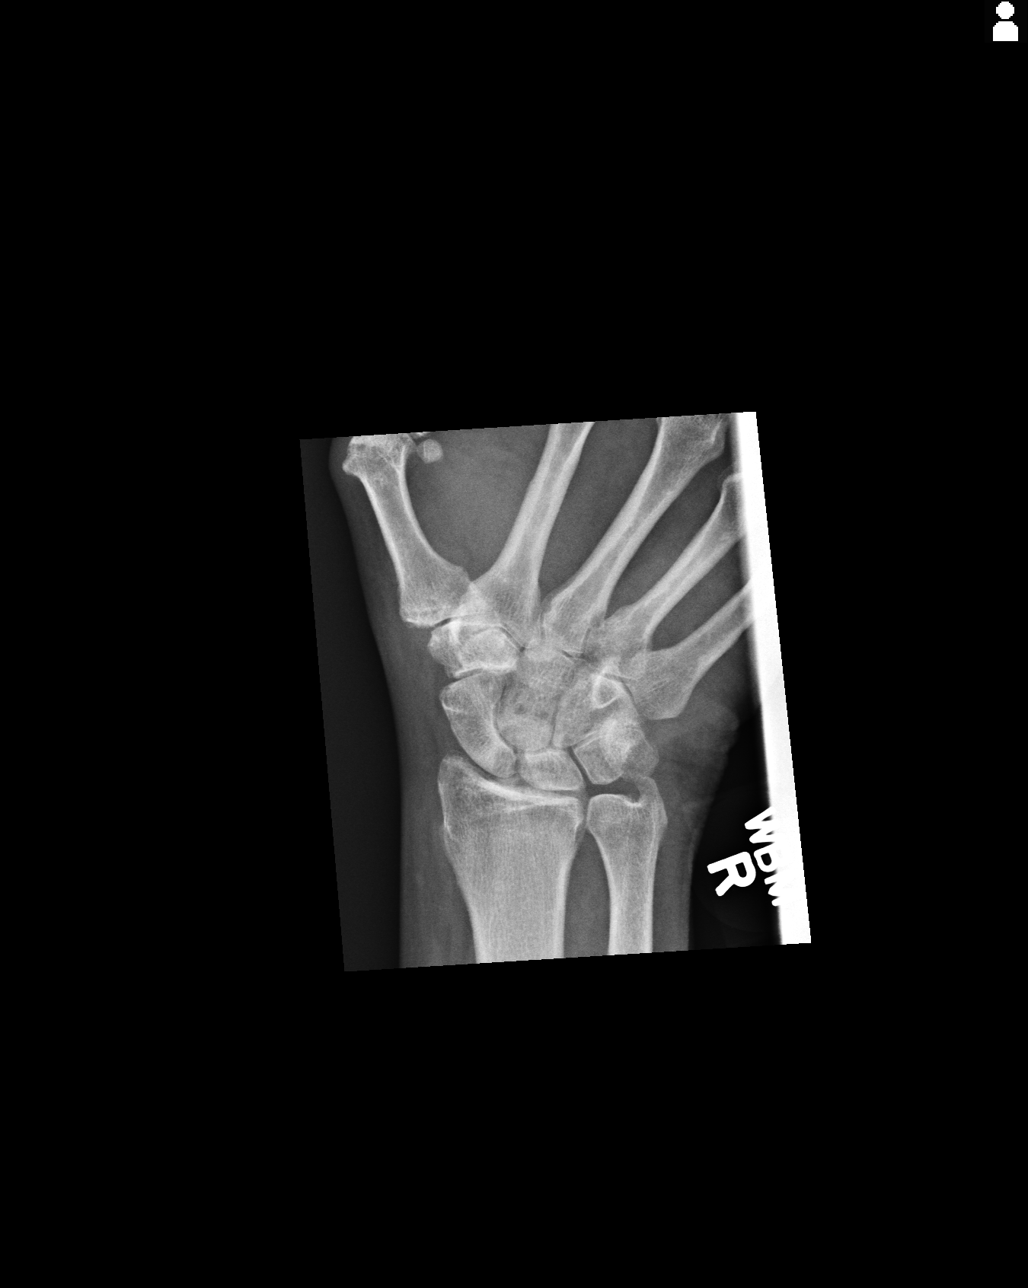

[4 of 4 positions shown; findings below may reference images not displayed]

FINDINGS: No acute bony or joint abnormality is identified. The carpal bones
appear crowded and there appear to be erosions present in the carpal
bones best seen on the oblique view. Soft tissues about the wrist
are mildly swollen. No fracture dislocation is identified.
IMPRESSION: Findings worrisome for inflammatory arthropathy such as rheumatoid.

Negative for acute abnormality.

## 2017-07-28 ENCOUNTER — Ambulatory Visit (INDEPENDENT_AMBULATORY_CARE_PROVIDER_SITE_OTHER): Payer: BLUE CROSS/BLUE SHIELD | Admitting: Family Medicine

## 2017-07-28 ENCOUNTER — Encounter: Payer: Self-pay | Admitting: Family Medicine

## 2017-07-28 VITALS — BP 120/84 | HR 101 | Ht 70.0 in | Wt 239.7 lb

## 2017-07-28 DIAGNOSIS — M170 Bilateral primary osteoarthritis of knee: Secondary | ICD-10-CM

## 2017-07-28 DIAGNOSIS — M1000 Idiopathic gout, unspecified site: Secondary | ICD-10-CM

## 2017-07-28 DIAGNOSIS — I10 Essential (primary) hypertension: Secondary | ICD-10-CM | POA: Diagnosis not present

## 2017-07-28 NOTE — Assessment & Plan Note (Signed)
The current medical regimen is effective;  continue present plan and medications.  

## 2017-07-28 NOTE — Progress Notes (Signed)
BP 120/84   Pulse (!) 101   Ht 5\' 10"  (1.778 m)   Wt 239 lb 11.2 oz (108.7 kg)   SpO2 98%   BMI 34.39 kg/m    Subjective:    Patient ID: Travis Diaz, male    DOB: 1955-10-21, 62 y.o.   MRN: [id number]  HPI: Travis Diaz is a 62 y.o. male  Chief Complaint  Patient presents with  . Follow-up  . Hypertension  Patient all in all doing well arthritis doing well controlled especially with flaxseed oil takes meloxicam from time to time he much rarely but works well when he takes it. Blood pressure Benzapril working well without problems good blood pressure control. Allopurinol doing well with no problems no gouty issues.  Relevant past medical, surgical, family and social history reviewed and updated as indicated. Interim medical history since our last visit reviewed. Allergies and medications reviewed and updated.  Review of Systems  Constitutional: Negative.   Respiratory: Negative.   Cardiovascular: Negative.     Per HPI unless specifically indicated above     Objective:    BP 120/84   Pulse (!) 101   Ht 5\' 10"  (1.778 m)   Wt 239 lb 11.2 oz (108.7 kg)   SpO2 98%   BMI 34.39 kg/m   Wt Readings from Last 3 Encounters:  07/28/17 239 lb 11.2 oz (108.7 kg)  01/17/17 236 lb (107 kg)  07/13/16 242 lb (109.8 kg)    Physical Exam  Constitutional: He is oriented to person, place, and time. He appears well-developed and well-nourished.  HENT:  Head: Normocephalic and atraumatic.  Eyes: Conjunctivae and EOM are normal.  Neck: Normal range of motion.  Cardiovascular: Normal rate, regular rhythm and normal heart sounds.  Pulmonary/Chest: Effort normal and breath sounds normal.  Musculoskeletal: Normal range of motion.  Neurological: He is alert and oriented to person, place, and time.  Skin: No erythema.  Psychiatric: He has a normal mood and affect. His behavior is normal. Judgment and thought content normal.    Results for orders placed or performed in  visit on 01/17/17  CBC with Differential/Platelet  Result Value Ref Range   WBC 7.7 3.4 - 10.8 x10E3/uL   RBC 5.16 4.14 - 5.80 x10E6/uL   Hemoglobin 16.1 13.0 - 17.7 g/dL   Hematocrit 46.3 37.5 - 51.0 %   MCV 90 79 - 97 fL   MCH 31.2 26.6 - 33.0 pg   MCHC 34.8 31.5 - 35.7 g/dL   RDW 14.1 12.3 - 15.4 %   Platelets 230 150 - 379 x10E3/uL   Neutrophils 68 Not Estab. %   Lymphs 18 Not Estab. %   Monocytes 10 Not Estab. %   Eos 3 Not Estab. %   Basos 0 Not Estab. %   Neutrophils Absolute 5.3 1.4 - 7.0 x10E3/uL   Lymphocytes Absolute 1.4 0.7 - 3.1 x10E3/uL   Monocytes Absolute 0.8 0.1 - 0.9 x10E3/uL   EOS (ABSOLUTE) 0.2 0.0 - 0.4 x10E3/uL   Basophils Absolute 0.0 0.0 - 0.2 x10E3/uL   Immature Granulocytes 1 Not Estab. %   Immature Grans (Abs) 0.1 0.0 - 0.1 x10E3/uL  Comprehensive metabolic panel  Result Value Ref Range   Glucose 85 65 - 99 mg/dL   BUN 22 8 - 27 mg/dL   Creatinine, Ser 1.21 0.76 - 1.27 mg/dL   GFR calc non Af Amer 64 >59 mL/min/1.73   GFR calc Af Amer 74 >59 mL/min/1.73   BUN/Creatinine Ratio  18 10 - 24   Sodium 139 134 - 144 mmol/L   Potassium 4.9 3.5 - 5.2 mmol/L   Chloride 101 96 - 106 mmol/L   CO2 24 20 - 29 mmol/L   Calcium 9.1 8.6 - 10.2 mg/dL   Total Protein 6.8 6.0 - 8.5 g/dL   Albumin 4.6 3.6 - 4.8 g/dL   Globulin, Total 2.2 1.5 - 4.5 g/dL   Albumin/Globulin Ratio 2.1 1.2 - 2.2   Bilirubin Total 0.4 0.0 - 1.2 mg/dL   Alkaline Phosphatase 54 39 - 117 IU/L   AST 17 0 - 40 IU/L   ALT 25 0 - 44 IU/L  Lipid panel  Result Value Ref Range   Cholesterol, Total 221 (H) 100 - 199 mg/dL   Triglycerides 257 (H) 0 - 149 mg/dL   HDL 63 >39 mg/dL   VLDL Cholesterol Cal 51 (H) 5 - 40 mg/dL   LDL Calculated 107 (H) 0 - 99 mg/dL   Chol/HDL Ratio 3.5 0.0 - 5.0 ratio  PSA  Result Value Ref Range   Prostate Specific Ag, Serum 1.5 0.0 - 4.0 ng/mL  TSH  Result Value Ref Range   TSH 9.210 (H) 0.450 - 4.500 uIU/mL  Urinalysis, Routine w reflex microscopic  Result  Value Ref Range   Specific Gravity, UA 1.025 1.005 - 1.030   pH, UA 5.5 5.0 - 7.5   Color, UA Yellow Yellow   Appearance Ur Clear Clear   Leukocytes, UA Negative Negative   Protein, UA Negative Negative/Trace   Glucose, UA Negative Negative   Ketones, UA Negative Negative   RBC, UA Negative Negative   Bilirubin, UA Negative Negative   Urobilinogen, Ur 0.2 0.2 - 1.0 mg/dL   Nitrite, UA Negative Negative  Hepatitis C antibody  Result Value Ref Range   Hep C Virus Ab <0.1 0.0 - 0.9 s/co ratio  HIV antibody  Result Value Ref Range   HIV Screen 4th Generation wRfx Non Reactive Non Reactive  Uric acid  Result Value Ref Range   Uric Acid 4.6 3.7 - 8.6 mg/dL      Assessment & Plan:   Problem List Items Addressed This Visit      Cardiovascular and Mediastinum   Hypertension - Primary    The current medical regimen is effective;  continue present plan and medications.         Musculoskeletal and Integument   Arthritis of both knees    The current medical regimen is effective;  continue present plan and medications.         Other   Acute gout    The current medical regimen is effective;  continue present plan and medications.           Follow up plan: Return in about 6 months (around 01/28/2018) for Physical Exam.

## 2017-07-29 LAB — BASIC METABOLIC PANEL
BUN/Creatinine Ratio: 22 (ref 10–24)
BUN: 25 mg/dL (ref 8–27)
CALCIUM: 9.2 mg/dL (ref 8.6–10.2)
CO2: 23 mmol/L (ref 20–29)
CREATININE: 1.14 mg/dL (ref 0.76–1.27)
Chloride: 100 mmol/L (ref 96–106)
GFR, EST AFRICAN AMERICAN: 79 mL/min/{1.73_m2} (ref >59)
GFR, EST NON AFRICAN AMERICAN: 69 mL/min/{1.73_m2} (ref >59)
Glucose: 84 mg/dL (ref 65–99)
POTASSIUM: 5.5 mmol/L — AB (ref 3.5–5.2)
Sodium: 138 mmol/L (ref 134–144)

## 2017-08-01 ENCOUNTER — Telehealth: Payer: Self-pay | Admitting: Family Medicine

## 2017-08-01 DIAGNOSIS — E875 Hyperkalemia: Secondary | ICD-10-CM

## 2017-08-01 NOTE — Telephone Encounter (Signed)
Phone call Discussed with patient elevated potassium will recheck BMP later this week.

## 2017-08-01 NOTE — Telephone Encounter (Signed)
-----   Message from Travis Diaz, [place] sent at 08/01/2017  5:07 PM EDT ----- Patient was transferred to provider for telephone conversation.

## 2017-08-01 NOTE — Telephone Encounter (Signed)
-----   Message from Amada Kingfisher, [place] sent at 08/01/2017  5:07 PM EDT ----- Patient was transferred to provider for telephone conversation.

## 2018-01-21 ENCOUNTER — Other Ambulatory Visit: Payer: Self-pay | Admitting: Family Medicine

## 2018-01-21 DIAGNOSIS — I10 Essential (primary) hypertension: Secondary | ICD-10-CM

## 2018-01-23 ENCOUNTER — Telehealth: Payer: Self-pay | Admitting: Family Medicine

## 2018-01-23 DIAGNOSIS — M1000 Idiopathic gout, unspecified site: Secondary | ICD-10-CM

## 2018-01-23 MED ORDER — ALLOPURINOL 300 MG PO TABS: 300.0000 mg | ORAL_TABLET | Freq: Every day | ORAL | 3 refills | Status: DC

## 2018-01-23 NOTE — Telephone Encounter (Signed)
Pt would like to have a refill on allopurinol 300 MG tablet states that he has contacted pharmacy and they are unable to refill. Pt uses [facility].

## 2018-01-23 NOTE — Telephone Encounter (Signed)
Requested medication (s) are due for refill today: yes  Requested medication (s) are on the active medication list: yes  Last refill:  01/16/17  Future visit scheduled: yes  Notes to clinic:  Prescription expired on 01/16/18. Overdue for lab (potassium). Last one was elevated.  Requested Prescriptions  Pending Prescriptions Disp Refills   benazepril (LOTENSIN) 40 MG tablet [Pharmacy Med Name: BENAZEPRIL 40MG      TAB] 30 tablet 5    Sig: TAKE 1 TABLET BY MOUTH ONCE DAILY     Cardiovascular:  ACE Inhibitors Failed - 01/21/2018 11:11 AM      Failed - K in normal range and within 180 days    Potassium  Date Value Ref Range Status  07/28/2017 5.5 (H) 3.5 - 5.2 mmol/L Final  04/12/2014 3.9 3.5 - 5.1 mmol/L Final         Passed - Cr in normal range and within 180 days    Creatinine  Date Value Ref Range Status  04/12/2014 0.99 0.60 - 1.30 mg/dL Final   Creatinine, Ser  Date Value Ref Range Status  07/28/2017 1.14 0.76 - 1.27 mg/dL Final         Passed - Patient is not pregnant      Passed - Last BP in normal range    BP Readings from Last 1 Encounters:  07/28/17 120/84         Passed - Valid encounter within last 6 months    Recent Outpatient Visits          5 months ago Essential hypertension   [facility] Guadalupe Maple, MD   1 year ago Essential hypertension   [place], Jeannette How, MD   1 year ago Essential hypertension   [place], Jeannette How, MD   2 years ago Elevated TSH   [place], Jeannette How, MD   2 years ago Acute idiopathic gout, unspecified site   [facility], Jeannette How, MD      Future Appointments            In 1 week [place], Jeannette How, MD [facility]

## 2018-02-01 ENCOUNTER — Ambulatory Visit (INDEPENDENT_AMBULATORY_CARE_PROVIDER_SITE_OTHER): Payer: BLUE CROSS/BLUE SHIELD | Admitting: Family Medicine

## 2018-02-01 ENCOUNTER — Encounter: Payer: Self-pay | Admitting: Family Medicine

## 2018-02-01 VITALS — BP 122/80 | HR 98 | Temp 98.4°F | Ht 70.0 in | Wt 243.0 lb

## 2018-02-01 DIAGNOSIS — M170 Bilateral primary osteoarthritis of knee: Secondary | ICD-10-CM

## 2018-02-01 DIAGNOSIS — I10 Essential (primary) hypertension: Secondary | ICD-10-CM | POA: Diagnosis not present

## 2018-02-01 DIAGNOSIS — M1000 Idiopathic gout, unspecified site: Secondary | ICD-10-CM | POA: Diagnosis not present

## 2018-02-01 DIAGNOSIS — Z0000 Encounter for general adult medical examination without abnormal findings: Secondary | ICD-10-CM | POA: Diagnosis not present

## 2018-02-01 MED ORDER — BENAZEPRIL HCL 40 MG PO TABS: 40.0000 mg | ORAL_TABLET | Freq: Every day | ORAL | 4 refills | Status: DC

## 2018-02-01 MED ORDER — MELOXICAM 15 MG PO TABS: 15.0000 mg | ORAL_TABLET | Freq: Every day | ORAL | 3 refills | Status: DC

## 2018-02-01 MED ORDER — ALLOPURINOL 300 MG PO TABS: 300.0000 mg | ORAL_TABLET | Freq: Every day | ORAL | 4 refills | Status: DC

## 2018-02-01 NOTE — Assessment & Plan Note (Signed)
The current medical regimen is effective;  continue present plan and medications.  

## 2018-02-01 NOTE — Assessment & Plan Note (Signed)
Filled out FMLA papers

## 2018-02-01 NOTE — Progress Notes (Signed)
BP 122/80 (BP Location: Left Arm, Patient Position: Sitting, Cuff Size: Large)   Pulse 98   Temp 98.4 F (36.9 C) (Oral)   Ht 5\' 10"  (1.778 m)   Wt 243 lb (110.2 kg)   SpO2 97%   BMI 34.87 kg/m    Subjective:    Patient ID: Travis Diaz, male    DOB: Jul 14, 1955, 62 y.o.   MRN: [id number]  HPI: Travis Diaz is a 62 y.o. male  Chief Complaint  Patient presents with  . Annual Exam  Patient all in all doing well taking benazepril for blood pressure good control no issues taking meloxicam 15 mg only as needed for arthritis. Needs FMLA for arthritis of his knees ankles hips.  Had to use this only rarely last year. Taking allopurinol 300 mg a day for gout with no signs or symptoms.  Relevant past medical, surgical, family and social history reviewed and updated as indicated. Interim medical history since our last visit reviewed. Allergies and medications reviewed and updated.  Review of Systems  Constitutional: Negative.   HENT: Negative.   Eyes: Negative.   Respiratory: Negative.   Cardiovascular: Negative.   Gastrointestinal: Negative.   Endocrine: Negative.   Genitourinary: Negative.   Musculoskeletal: Negative.   Skin: Negative.   Allergic/Immunologic: Negative.   Neurological: Negative.   Hematological: Negative.   Psychiatric/Behavioral: Negative.     Per HPI unless specifically indicated above     Objective:    BP 122/80 (BP Location: Left Arm, Patient Position: Sitting, Cuff Size: Large)   Pulse 98   Temp 98.4 F (36.9 C) (Oral)   Ht 5\' 10"  (1.778 m)   Wt 243 lb (110.2 kg)   SpO2 97%   BMI 34.87 kg/m   Wt Readings from Last 3 Encounters:  02/01/18 243 lb (110.2 kg)  07/28/17 239 lb 11.2 oz (108.7 kg)  01/17/17 236 lb (107 kg)    Physical Exam  Constitutional: He is oriented to person, place, and time. He appears well-developed and well-nourished.  HENT:  Head: Normocephalic and atraumatic.  Right Ear: External ear normal.  Left Ear:  External ear normal.  Eyes: Pupils are equal, round, and reactive to light. Conjunctivae and EOM are normal.  Neck: Normal range of motion. Neck supple.  Cardiovascular: Normal rate, regular rhythm, normal heart sounds and intact distal pulses.  Pulmonary/Chest: Effort normal and breath sounds normal.  Abdominal: Soft. Bowel sounds are normal. There is no splenomegaly or hepatomegaly.  Genitourinary: Rectum normal, prostate normal and penis normal.  Musculoskeletal: Normal range of motion.  Neurological: He is alert and oriented to person, place, and time. He has normal reflexes.  Skin: No rash noted. No erythema.  Psychiatric: He has a normal mood and affect. His behavior is normal. Judgment and thought content normal.    Results for orders placed or performed in visit on 75/64/33  Basic metabolic panel  Result Value Ref Range   Glucose 84 65 - 99 mg/dL   BUN 25 8 - 27 mg/dL   Creatinine, Ser 1.14 0.76 - 1.27 mg/dL   GFR calc non Af Amer 69 >59 mL/min/1.73   GFR calc Af Amer 79 >59 mL/min/1.73   BUN/Creatinine Ratio 22 10 - 24   Sodium 138 134 - 144 mmol/L   Potassium 5.5 (H) 3.5 - 5.2 mmol/L   Chloride 100 96 - 106 mmol/L   CO2 23 20 - 29 mmol/L   Calcium 9.2 8.6 - 10.2 mg/dL  Assessment & Plan:   Problem List Items Addressed This Visit      Cardiovascular and Mediastinum   Hypertension    The current medical regimen is effective;  continue present plan and medications.       Relevant Medications   benazepril (LOTENSIN) 40 MG tablet     Musculoskeletal and Integument   Arthritis of both knees    Filled out FMLA papers      Relevant Medications   meloxicam (MOBIC) 15 MG tablet   allopurinol (ZYLOPRIM) 300 MG tablet     Other   Acute gout    The current medical regimen is effective;  continue present plan and medications.       Relevant Medications   allopurinol (ZYLOPRIM) 300 MG tablet   Other Relevant Orders   Uric acid    Other Visit Diagnoses     PE (physical exam), annual    -  Primary   Relevant Orders   Comprehensive metabolic panel   Lipid panel   CBC with Differential/Platelet   TSH   Urinalysis, Routine w reflex microscopic   PSA       Follow up plan: Return in about 1 year (around 02/02/2019) for Physical Exam.

## 2018-02-02 LAB — COMPREHENSIVE METABOLIC PANEL
A/G RATIO: 1.7 (ref 1.2–2.2)
ALT: 21 IU/L (ref 0–44)
AST: 19 IU/L (ref 0–40)
Albumin: 4.2 g/dL (ref 3.6–4.8)
Alkaline Phosphatase: 53 IU/L (ref 39–117)
BILIRUBIN TOTAL: 0.4 mg/dL (ref 0.0–1.2)
BUN/Creatinine Ratio: 21 (ref 10–24)
BUN: 27 mg/dL (ref 8–27)
CALCIUM: 9.6 mg/dL (ref 8.6–10.2)
CHLORIDE: 101 mmol/L (ref 96–106)
CO2: 23 mmol/L (ref 20–29)
Creatinine, Ser: 1.26 mg/dL (ref 0.76–1.27)
GFR calc Af Amer: 70 mL/min/{1.73_m2} (ref >59)
GFR, EST NON AFRICAN AMERICAN: 60 mL/min/{1.73_m2} (ref >59)
GLOBULIN, TOTAL: 2.5 g/dL (ref 1.5–4.5)
Glucose: 73 mg/dL (ref 65–99)
POTASSIUM: 4.9 mmol/L (ref 3.5–5.2)
SODIUM: 139 mmol/L (ref 134–144)
Total Protein: 6.7 g/dL (ref 6.0–8.5)

## 2018-02-02 LAB — CBC WITH DIFFERENTIAL/PLATELET
Basophils Absolute: 0 10*3/uL (ref 0.0–0.2)
Basos: 0 %
EOS (ABSOLUTE): 0.2 10*3/uL (ref 0.0–0.4)
EOS: 2 %
HEMATOCRIT: 46.1 % (ref 37.5–51.0)
Hemoglobin: 15.9 g/dL (ref 13.0–17.7)
Immature Grans (Abs): 0.1 10*3/uL (ref 0.0–0.1)
Immature Granulocytes: 1 %
LYMPHS: 20 %
Lymphocytes Absolute: 1.4 10*3/uL (ref 0.7–3.1)
MCH: 30.9 pg (ref 26.6–33.0)
MCHC: 34.5 g/dL (ref 31.5–35.7)
MCV: 90 fL (ref 79–97)
MONOCYTES: 10 %
MONOS ABS: 0.7 10*3/uL (ref 0.1–0.9)
NEUTROS ABS: 4.7 10*3/uL (ref 1.4–7.0)
Neutrophils: 67 %
Platelets: 261 10*3/uL (ref 150–450)
RBC: 5.14 x10E6/uL (ref 4.14–5.80)
RDW: 13 % (ref 12.3–15.4)
WBC: 7.1 10*3/uL (ref 3.4–10.8)

## 2018-02-02 LAB — LIPID PANEL
CHOL/HDL RATIO: 3.3 ratio (ref 0.0–5.0)
Cholesterol, Total: 217 mg/dL — ABNORMAL HIGH (ref 100–199)
HDL: 65 mg/dL (ref >39)
LDL Calculated: 117 mg/dL — ABNORMAL HIGH (ref 0–99)
TRIGLYCERIDES: 175 mg/dL — AB (ref 0–149)
VLDL Cholesterol Cal: 35 mg/dL (ref 5–40)

## 2018-02-02 LAB — PSA: PROSTATE SPECIFIC AG, SERUM: 1.4 ng/mL (ref 0.0–4.0)

## 2018-02-02 LAB — TSH: TSH: 5.49 u[IU]/mL — ABNORMAL HIGH (ref 0.450–4.500)

## 2018-02-02 LAB — URIC ACID: Uric Acid: 5.2 mg/dL (ref 3.7–8.6)

## 2019-02-05 ENCOUNTER — Encounter: Payer: BLUE CROSS/BLUE SHIELD | Admitting: Family Medicine

## 2019-02-07 ENCOUNTER — Other Ambulatory Visit: Payer: Self-pay

## 2019-02-07 ENCOUNTER — Encounter: Payer: Self-pay | Admitting: Family Medicine

## 2019-02-07 ENCOUNTER — Ambulatory Visit (INDEPENDENT_AMBULATORY_CARE_PROVIDER_SITE_OTHER): Payer: BLUE CROSS/BLUE SHIELD | Admitting: Family Medicine

## 2019-02-07 VITALS — BP 125/90 | HR 109 | Temp 98.9°F | Ht 70.0 in | Wt 265.0 lb

## 2019-02-07 DIAGNOSIS — I10 Essential (primary) hypertension: Secondary | ICD-10-CM

## 2019-02-07 DIAGNOSIS — Z0000 Encounter for general adult medical examination without abnormal findings: Secondary | ICD-10-CM

## 2019-02-07 DIAGNOSIS — M170 Bilateral primary osteoarthritis of knee: Secondary | ICD-10-CM

## 2019-02-07 DIAGNOSIS — M1000 Idiopathic gout, unspecified site: Secondary | ICD-10-CM

## 2019-02-07 LAB — UA/M W/RFLX CULTURE, ROUTINE
Bilirubin, UA: NEGATIVE
Glucose, UA: NEGATIVE
Leukocytes,UA: NEGATIVE
Nitrite, UA: NEGATIVE
Protein,UA: NEGATIVE
RBC, UA: NEGATIVE
Specific Gravity, UA: >1.03 — ABNORMAL HIGH (ref 1.005–1.030)
Urobilinogen, Ur: 0.2 mg/dL (ref 0.2–1.0)
pH, UA: 5 (ref 5.0–7.5)

## 2019-02-07 NOTE — Progress Notes (Signed)
BP 125/90   Pulse (!) 109   Temp 98.9 F (37.2 C) (Oral)   Ht 5\' 10"  (1.778 m)   Wt 265 lb (120.2 kg)   SpO2 96%   BMI 38.02 kg/m    Subjective:    Patient ID: Travis Diaz, male    DOB: 26-May-1955, 63 y.o.   MRN: [id number]  HPI: Travis Diaz is a 63 y.o. male presenting on 02/07/2019 for comprehensive medical examination. Current medical complaints include:see below  HTN - not checking home BPs. Taking his medicine faithfully without side effects. Denies CP, SOB, HAs, dizziness.   Gout - has not had a flare for years since he was put on the allopurinol.   Arthritis in multiple joints - meloxicam helping as needed.   He currently lives with: Interim Problems from his last visit: no  Depression Screen done today and results listed below:  Depression screen [facility] 2/9 02/07/2019 02/01/2018 07/28/2017 01/17/2017 01/13/2016  Decreased Interest 0 0 0 0 0  Down, Depressed, Hopeless 0 0 0 0 0  PHQ - 2 Score 0 0 0 0 0  Altered sleeping 0 - - - -  Tired, decreased energy 0 - - - -  Change in appetite 2 - - - -  Feeling bad or failure about yourself  0 - - - -  Trouble concentrating 0 - - - -  Moving slowly or fidgety/restless 0 - - - -  Suicidal thoughts 0 - - - -  PHQ-9 Score 2 - - - -    The patient does not have a history of falls. I did complete a risk assessment for falls. A plan of care for falls was documented.   Past Medical History:  Past Medical History:  Diagnosis Date  . Gout   . Hypertension     Surgical History:  Past Surgical History:  Procedure Laterality Date  . broken arm    . COLONOSCOPY WITH PROPOFOL N/A 05/14/2016   Procedure: COLONOSCOPY WITH PROPOFOL;  Surgeon: Manya Silvas, MD;  Location: [facility] ENDOSCOPY;  Service: Endoscopy;  Laterality: N/A;  . upper leg procedure Right    removal of blood clots    Medications:  Current Outpatient Medications on File Prior to Visit  Medication Sig  . allopurinol (ZYLOPRIM) 300 MG tablet  Take 1 tablet (300 mg total) by mouth daily.  . benazepril (LOTENSIN) 40 MG tablet Take 1 tablet (40 mg total) by mouth daily.  . Flaxseed, Linseed, (FLAXSEED OIL) 1000 MG CAPS Take by mouth.  . meloxicam (MOBIC) 15 MG tablet Take 1 tablet (15 mg total) by mouth daily.  . vitamin E 400 UNIT capsule Take by mouth.   No current facility-administered medications on file prior to visit.     Allergies:  Allergies  Allergen Reactions  . Sulfur Other (See Comments)    Was told by his mother    Social History:  Social History   Socioeconomic History  . Marital status: Married    Spouse name: Not on file  . Number of children: Not on file  . Years of education: Not on file  . Highest education level: Not on file  Occupational History  . Not on file  Social Needs  . Financial resource strain: Not on file  . Food insecurity    Worry: Not on file    Inability: Not on file  . Transportation needs    Medical: Not on file    Non-medical: Not on file  Tobacco Use  . Smoking status: Former Smoker    Types: Cigarettes    Quit date: 03/29/2004    Years since quitting: 14.8  . Smokeless tobacco: Never Used  Substance and Sexual Activity  . Alcohol use: Yes    Alcohol/week: 24.0 standard drinks    Types: 24 Cans of beer per week  . Drug use: No  . Sexual activity: Yes  Lifestyle  . Physical activity    Days per week: Not on file    Minutes per session: Not on file  . Stress: Not on file  Relationships  . Social Herbalist on phone: Not on file    Gets together: Not on file    Attends religious service: Not on file    Active member of club or organization: Not on file    Attends meetings of clubs or organizations: Not on file    Relationship status: Not on file  . Intimate partner violence    Fear of current or ex partner: Not on file    Emotionally abused: Not on file    Physically abused: Not on file    Forced sexual activity: Not on file  Other Topics Concern  .  Not on file  Social History Narrative  . Not on file   Social History   Tobacco Use  Smoking Status Former Smoker  . Types: Cigarettes  . Quit date: 03/29/2004  . Years since quitting: 14.8  Smokeless Tobacco Never Used   Social History   Substance and Sexual Activity  Alcohol Use Yes  . Alcohol/week: 24.0 standard drinks  . Types: 24 Cans of beer per week    Family History:  Family History  Problem Relation Age of Onset  . Cancer Mother        male  . Heart disease Father   . Cancer Father   . Early death Father   . Heart attack Father   . Diabetes Sister   . Dementia Maternal Grandmother   . Heart attack Maternal Grandfather   . Heart attack Paternal Grandmother   . Early death Paternal Grandfather   . Heart attack Paternal Grandfather     Past medical history, surgical history, medications, allergies, family history and social history reviewed with patient today and changes made to appropriate areas of the chart.   Review of Systems - General ROS: negative Psychological ROS: negative Ophthalmic ROS: negative ENT ROS: negative Allergy and Immunology ROS: negative Hematological and Lymphatic ROS: negative Endocrine ROS: negative Breast ROS: negative for breast lumps Respiratory ROS: no cough, shortness of breath, or wheezing Cardiovascular ROS: no chest pain or dyspnea on exertion Gastrointestinal ROS: no abdominal pain, change in bowel habits, or black or bloody stools Genito-Urinary ROS: no dysuria, trouble voiding, or hematuria Musculoskeletal ROS: negative Neurological ROS: no TIA or stroke symptoms Dermatological ROS: negative All other ROS negative except what is listed above and in the HPI.      Objective:    BP 125/90   Pulse (!) 109   Temp 98.9 F (37.2 C) (Oral)   Ht 5\' 10"  (1.778 m)   Wt 265 lb (120.2 kg)   SpO2 96%   BMI 38.02 kg/m   Wt Readings from Last 3 Encounters:  02/07/19 265 lb (120.2 kg)  02/01/18 243 lb (110.2 kg)   07/28/17 239 lb 11.2 oz (108.7 kg)    Physical Exam Vitals signs and nursing note reviewed.  Constitutional:      General:  He is not in acute distress.    Appearance: He is well-developed.  HENT:     Head: Atraumatic.     Right Ear: Tympanic membrane and external ear normal.     Left Ear: Tympanic membrane and external ear normal.     Nose: Nose normal.     Mouth/Throat:     Mouth: Mucous membranes are moist.     Pharynx: Oropharynx is clear.  Eyes:     General: No scleral icterus.    Conjunctiva/sclera: Conjunctivae normal.     Pupils: Pupils are equal, round, and reactive to light.  Neck:     Musculoskeletal: Normal range of motion and neck supple.  Cardiovascular:     Rate and Rhythm: Normal rate and regular rhythm.     Heart sounds: Normal heart sounds. No murmur.  Pulmonary:     Effort: Pulmonary effort is normal. No respiratory distress.     Breath sounds: Normal breath sounds.  Abdominal:     General: Bowel sounds are normal. There is no distension.     Palpations: Abdomen is soft. There is no mass.     Tenderness: There is no abdominal tenderness. There is no guarding.  Genitourinary:    Comments: Prostate mildly enlarged, symmetric, no nodules on palpation Musculoskeletal: Normal range of motion.        General: No tenderness.  Skin:    General: Skin is warm and dry.     Findings: No rash.  Neurological:     General: No focal deficit present.     Mental Status: He is alert and oriented to person, place, and time.     Deep Tendon Reflexes: Reflexes are normal and symmetric.  Psychiatric:        Mood and Affect: Mood normal.        Behavior: Behavior normal.        Thought Content: Thought content normal.        Judgment: Judgment normal.     Results for orders placed or performed in visit on 02/07/19  CBC with Differential/Platelet out  Result Value Ref Range   WBC 6.9 3.4 - 10.8 x10E3/uL   RBC 5.12 4.14 - 5.80 x10E6/uL   Hemoglobin 16.1 13.0 - 17.7  g/dL   Hematocrit 48.2 37.5 - 51.0 %   MCV 94 79 - 97 fL   MCH 31.4 26.6 - 33.0 pg   MCHC 33.4 31.5 - 35.7 g/dL   RDW 13.8 11.6 - 15.4 %   Platelets 228 150 - 450 x10E3/uL   Neutrophils 66 Not Estab. %   Lymphs 18 Not Estab. %   Monocytes 11 Not Estab. %   Eos 3 Not Estab. %   Basos 1 Not Estab. %   Neutrophils Absolute 4.6 1.4 - 7.0 x10E3/uL   Lymphocytes Absolute 1.2 0.7 - 3.1 x10E3/uL   Monocytes Absolute 0.8 0.1 - 0.9 x10E3/uL   EOS (ABSOLUTE) 0.2 0.0 - 0.4 x10E3/uL   Basophils Absolute 0.0 0.0 - 0.2 x10E3/uL   Immature Granulocytes 1 Not Estab. %   Immature Grans (Abs) 0.1 0.0 - 0.1 x10E3/uL  Comprehensive metabolic panel  Result Value Ref Range   Glucose 91 65 - 99 mg/dL   BUN 18 8 - 27 mg/dL   Creatinine, Ser 1.24 0.76 - 1.27 mg/dL   GFR calc non Af Amer 61 >59 mL/min/1.73   GFR calc Af Amer 71 >59 mL/min/1.73   BUN/Creatinine Ratio 15 10 - 24   Sodium 136 134 -  144 mmol/L   Potassium 5.3 (H) 3.5 - 5.2 mmol/L   Chloride 99 96 - 106 mmol/L   CO2 22 20 - 29 mmol/L   Calcium 9.4 8.6 - 10.2 mg/dL   Total Protein 6.7 6.0 - 8.5 g/dL   Albumin 4.5 3.8 - 4.8 g/dL   Globulin, Total 2.2 1.5 - 4.5 g/dL   Albumin/Globulin Ratio 2.0 1.2 - 2.2   Bilirubin Total 0.4 0.0 - 1.2 mg/dL   Alkaline Phosphatase 60 39 - 117 IU/L   AST 24 0 - 40 IU/L   ALT 30 0 - 44 IU/L  Lipid Panel w/o Chol/HDL Ratio out  Result Value Ref Range   Cholesterol, Total 215 (H) 100 - 199 mg/dL   Triglycerides 212 (H) 0 - 149 mg/dL   HDL 67 >39 mg/dL   VLDL Cholesterol Cal 36 5 - 40 mg/dL   LDL Chol Calc (NIH) 112 (H) 0 - 99 mg/dL  UA/M w/rflx Culture, Routine   Specimen: Urine   URINE  Result Value Ref Range   Specific Gravity, UA >1.030 (H) 1.005 - 1.030   pH, UA 5.0 5.0 - 7.5   Color, UA Yellow Yellow   Appearance Ur Clear Clear   Leukocytes,UA Negative Negative   Protein,UA Negative Negative/Trace   Glucose, UA Negative Negative   Ketones, UA Trace (A) Negative   RBC, UA Negative Negative    Bilirubin, UA Negative Negative   Urobilinogen, Ur 0.2 0.2 - 1.0 mg/dL   Nitrite, UA Negative Negative      Assessment & Plan:   Problem List Items Addressed This Visit      Cardiovascular and Mediastinum   Hypertension    BPs stable and WNL, continue current regimen      Relevant Orders   CBC with Differential/Platelet out (Completed)   Comprehensive metabolic panel (Completed)   UA/M w/rflx Culture, Routine (Completed)     Musculoskeletal and Integument   Arthritis of both knees    Under good control with prn NSAIDs. Continue current regimen, weight loss        Other   Acute gout - Primary    No flares since starting allopurinol years ago. Continue current regimen       Other Visit Diagnoses    Annual physical exam       Relevant Orders   Lipid Panel w/o Chol/HDL Ratio out (Completed)       Discussed aspirin prophylaxis for myocardial infarction prevention and decision was made to continue ASA  LABORATORY TESTING:  Health maintenance labs ordered today as discussed above.   The natural history of prostate cancer and ongoing controversy regarding screening and potential treatment outcomes of prostate cancer has been discussed with the patient. The meaning of a false positive PSA and a false negative PSA has been discussed. He indicates understanding of the limitations of this screening test and wishes not to proceed with screening PSA testing.   IMMUNIZATIONS:   - Tdap: Tetanus vaccination status reviewed: last tetanus booster within 10 years. - Influenza: Refused  SCREENING: - Colonoscopy: Up to date  Discussed with patient purpose of the colonoscopy is to detect colon cancer at curable precancerous or early stages   PATIENT COUNSELING:    Sexuality: Discussed sexually transmitted diseases, partner selection, use of condoms, avoidance of unintended pregnancy  and contraceptive alternatives.   Advised to avoid cigarette smoking.  I discussed with the  patient that most people either abstain from alcohol or drink within safe limits (<=14/week  and <=4 drinks/occasion for males, <=7/weeks and <= 3 drinks/occasion for females) and that the risk for alcohol disorders and other health effects rises proportionally with the number of drinks per week and how often a drinker exceeds daily limits.  Discussed cessation/primary prevention of drug use and availability of treatment for abuse.   Diet: Encouraged to adjust caloric intake to maintain  or achieve ideal body weight, to reduce intake of dietary saturated fat and total fat, to limit sodium intake by avoiding high sodium foods and not adding table salt, and to maintain adequate dietary potassium and calcium preferably from fresh fruits, vegetables, and low-fat dairy products.    stressed the importance of regular exercise  Injury prevention: Discussed safety belts, safety helmets, smoke detector, smoking near bedding or upholstery.   Dental health: Discussed importance of regular tooth brushing, flossing, and dental visits.   Follow up plan: NEXT PREVENTATIVE PHYSICAL DUE IN 1 YEAR. Return in about 6 months (around 08/07/2019) for 6 month f/u.

## 2019-02-08 LAB — CBC WITH DIFFERENTIAL/PLATELET
Basophils Absolute: 0 10*3/uL (ref 0.0–0.2)
Basos: 1 %
EOS (ABSOLUTE): 0.2 10*3/uL (ref 0.0–0.4)
Eos: 3 %
Hematocrit: 48.2 % (ref 37.5–51.0)
Hemoglobin: 16.1 g/dL (ref 13.0–17.7)
Immature Grans (Abs): 0.1 10*3/uL (ref 0.0–0.1)
Immature Granulocytes: 1 %
Lymphocytes Absolute: 1.2 10*3/uL (ref 0.7–3.1)
Lymphs: 18 %
MCH: 31.4 pg (ref 26.6–33.0)
MCHC: 33.4 g/dL (ref 31.5–35.7)
MCV: 94 fL (ref 79–97)
Monocytes Absolute: 0.8 10*3/uL (ref 0.1–0.9)
Monocytes: 11 %
Neutrophils Absolute: 4.6 10*3/uL (ref 1.4–7.0)
Neutrophils: 66 %
Platelets: 228 10*3/uL (ref 150–450)
RBC: 5.12 x10E6/uL (ref 4.14–5.80)
RDW: 13.8 % (ref 11.6–15.4)
WBC: 6.9 10*3/uL (ref 3.4–10.8)

## 2019-02-08 LAB — COMPREHENSIVE METABOLIC PANEL
ALT: 30 IU/L (ref 0–44)
AST: 24 IU/L (ref 0–40)
Albumin/Globulin Ratio: 2 (ref 1.2–2.2)
Albumin: 4.5 g/dL (ref 3.8–4.8)
Alkaline Phosphatase: 60 IU/L (ref 39–117)
BUN/Creatinine Ratio: 15 (ref 10–24)
BUN: 18 mg/dL (ref 8–27)
Bilirubin Total: 0.4 mg/dL (ref 0.0–1.2)
CO2: 22 mmol/L (ref 20–29)
Calcium: 9.4 mg/dL (ref 8.6–10.2)
Chloride: 99 mmol/L (ref 96–106)
Creatinine, Ser: 1.24 mg/dL (ref 0.76–1.27)
GFR calc Af Amer: 71 mL/min/{1.73_m2} (ref >59)
GFR calc non Af Amer: 61 mL/min/{1.73_m2} (ref >59)
Globulin, Total: 2.2 g/dL (ref 1.5–4.5)
Glucose: 91 mg/dL (ref 65–99)
Potassium: 5.3 mmol/L — ABNORMAL HIGH (ref 3.5–5.2)
Sodium: 136 mmol/L (ref 134–144)
Total Protein: 6.7 g/dL (ref 6.0–8.5)

## 2019-02-08 LAB — LIPID PANEL W/O CHOL/HDL RATIO
Cholesterol, Total: 215 mg/dL — ABNORMAL HIGH (ref 100–199)
HDL: 67 mg/dL (ref >39)
LDL Chol Calc (NIH): 112 mg/dL — ABNORMAL HIGH (ref 0–99)
Triglycerides: 212 mg/dL — ABNORMAL HIGH (ref 0–149)
VLDL Cholesterol Cal: 36 mg/dL (ref 5–40)

## 2019-02-12 NOTE — Assessment & Plan Note (Signed)
BPs stable and WNL, continue current regimen 

## 2019-02-12 NOTE — Assessment & Plan Note (Signed)
Under good control with prn NSAIDs. Continue current regimen, weight loss

## 2019-02-12 NOTE — Assessment & Plan Note (Signed)
No flares since starting allopurinol years ago. Continue current regimen

## 2019-02-21 ENCOUNTER — Other Ambulatory Visit: Payer: Self-pay

## 2019-02-27 ENCOUNTER — Telehealth: Payer: Self-pay | Admitting: Family Medicine

## 2019-02-27 DIAGNOSIS — I10 Essential (primary) hypertension: Secondary | ICD-10-CM

## 2019-02-27 DIAGNOSIS — M1000 Idiopathic gout, unspecified site: Secondary | ICD-10-CM

## 2019-02-27 MED ORDER — BENAZEPRIL HCL 40 MG PO TABS: 40.0000 mg | ORAL_TABLET | Freq: Every day | ORAL | 1 refills | Status: DC

## 2019-02-27 MED ORDER — ALLOPURINOL 300 MG PO TABS: 300.0000 mg | ORAL_TABLET | Freq: Every day | ORAL | 1 refills | Status: DC

## 2019-02-27 NOTE — Telephone Encounter (Signed)
Pt stated understating.

## 2019-02-27 NOTE — Telephone Encounter (Signed)
He had one refill left from his previous supply so we had held off sending just yet but I just sent them over regardless so he would be sure to have something available

## 2019-02-27 NOTE — Telephone Encounter (Signed)
PT, states benazepril and allopurinol RX was never sent to [facility].Please advise.

## 2019-08-10 ENCOUNTER — Ambulatory Visit (INDEPENDENT_AMBULATORY_CARE_PROVIDER_SITE_OTHER): Payer: Self-pay | Admitting: Family Medicine

## 2019-08-10 ENCOUNTER — Encounter: Payer: Self-pay | Admitting: Family Medicine

## 2019-08-10 ENCOUNTER — Other Ambulatory Visit: Payer: Self-pay

## 2019-08-10 VITALS — BP 138/88 | HR 97 | Temp 98.4°F | Ht 69.88 in | Wt 274.1 lb

## 2019-08-10 DIAGNOSIS — Z6839 Body mass index (BMI) 39.0-39.9, adult: Secondary | ICD-10-CM

## 2019-08-10 DIAGNOSIS — R5383 Other fatigue: Secondary | ICD-10-CM

## 2019-08-10 DIAGNOSIS — I10 Essential (primary) hypertension: Secondary | ICD-10-CM

## 2019-08-10 DIAGNOSIS — M170 Bilateral primary osteoarthritis of knee: Secondary | ICD-10-CM

## 2019-08-10 DIAGNOSIS — E669 Obesity, unspecified: Secondary | ICD-10-CM | POA: Insufficient documentation

## 2019-08-10 DIAGNOSIS — M1000 Idiopathic gout, unspecified site: Secondary | ICD-10-CM

## 2019-08-10 NOTE — Assessment & Plan Note (Signed)
Stable on prn meloxicam, continue current regimen

## 2019-08-10 NOTE — Assessment & Plan Note (Signed)
Discussed diet and exercise changes  

## 2019-08-10 NOTE — Assessment & Plan Note (Signed)
BPs stable and WNL, continue current regimen 

## 2019-08-10 NOTE — Progress Notes (Signed)
BP 138/88   Pulse 97   Temp 98.4 F (36.9 C)   Ht 5' 9.88" (1.775 m)   Wt 274 lb 2 oz (124.3 kg)   SpO2 99%   BMI 39.47 kg/m    Subjective:    Patient ID: Travis Diaz, male    DOB: [birth year], 64 y.o.   MRN: [id number]  HPI: Travis Diaz is a 64 y.o. male  Chief Complaint  Patient presents with  . Hypertension  . Gout   Presenting today for 6 month f/u chronic conditions.   Does not check home BPs. Taking medication faithfully without side effects. Denies CP, SOB, HAs, dizziness.   Has not had any gout flares since starting allopurinol. Does state he's been drinking alcohol more heavily than usual and not watching diet.   Meloxicam helping quite a bit with arthritis pains. States he takes prn and the pain subsides. Denies joint swelling, redness, deformities.   Used to be on testosterone supplementation, wondering if this would help give him a boost of energy and help with weight loss. Has not had levels checked in years.   Relevant past medical, surgical, family and social history reviewed and updated as indicated. Interim medical history since our last visit reviewed. Allergies and medications reviewed and updated.  Review of Systems  Per HPI unless specifically indicated above     Objective:    BP 138/88   Pulse 97   Temp 98.4 F (36.9 C)   Ht 5' 9.88" (1.775 m)   Wt 274 lb 2 oz (124.3 kg)   SpO2 99%   BMI 39.47 kg/m   Wt Readings from Last 3 Encounters:  08/10/19 274 lb 2 oz (124.3 kg)  02/07/19 265 lb (120.2 kg)  02/01/18 243 lb (110.2 kg)    Physical Exam Vitals and nursing note reviewed.  Constitutional:      Appearance: Normal appearance.  HENT:     Head: Atraumatic.  Eyes:     Extraocular Movements: Extraocular movements intact.     Conjunctiva/sclera: Conjunctivae normal.  Cardiovascular:     Rate and Rhythm: Normal rate and regular rhythm.  Pulmonary:     Effort: Pulmonary effort is normal.     Breath sounds: Normal  breath sounds.  Musculoskeletal:        General: Normal range of motion.     Cervical back: Normal range of motion and neck supple.  Skin:    General: Skin is warm and dry.  Neurological:     General: No focal deficit present.     Mental Status: He is oriented to person, place, and time.  Psychiatric:        Mood and Affect: Mood normal.        Thought Content: Thought content normal.        Judgment: Judgment normal.     Results for orders placed or performed in visit on 02/07/19  CBC with Differential/Platelet out  Result Value Ref Range   WBC 6.9 3.4 - 10.8 x10E3/uL   RBC 5.12 4.14 - 5.80 x10E6/uL   Hemoglobin 16.1 13.0 - 17.7 g/dL   Hematocrit 48.2 37.5 - 51.0 %   MCV 94 79 - 97 fL   MCH 31.4 26.6 - 33.0 pg   MCHC 33.4 31.5 - 35.7 g/dL   RDW 13.8 11.6 - 15.4 %   Platelets 228 150 - 450 x10E3/uL   Neutrophils 66 Not Estab. %   Lymphs 18 Not Estab. %   Monocytes  11 Not Estab. %   Eos 3 Not Estab. %   Basos 1 Not Estab. %   Neutrophils Absolute 4.6 1.4 - 7.0 x10E3/uL   Lymphocytes Absolute 1.2 0.7 - 3.1 x10E3/uL   Monocytes Absolute 0.8 0.1 - 0.9 x10E3/uL   EOS (ABSOLUTE) 0.2 0.0 - 0.4 x10E3/uL   Basophils Absolute 0.0 0.0 - 0.2 x10E3/uL   Immature Granulocytes 1 Not Estab. %   Immature Grans (Abs) 0.1 0.0 - 0.1 x10E3/uL  Comprehensive metabolic panel  Result Value Ref Range   Glucose 91 65 - 99 mg/dL   BUN 18 8 - 27 mg/dL   Creatinine, Ser 1.24 0.76 - 1.27 mg/dL   GFR calc non Af Amer 61 >59 mL/min/1.73   GFR calc Af Amer 71 >59 mL/min/1.73   BUN/Creatinine Ratio 15 10 - 24   Sodium 136 134 - 144 mmol/L   Potassium 5.3 (H) 3.5 - 5.2 mmol/L   Chloride 99 96 - 106 mmol/L   CO2 22 20 - 29 mmol/L   Calcium 9.4 8.6 - 10.2 mg/dL   Total Protein 6.7 6.0 - 8.5 g/dL   Albumin 4.5 3.8 - 4.8 g/dL   Globulin, Total 2.2 1.5 - 4.5 g/dL   Albumin/Globulin Ratio 2.0 1.2 - 2.2   Bilirubin Total 0.4 0.0 - 1.2 mg/dL   Alkaline Phosphatase 60 39 - 117 IU/L   AST 24 0 - 40  IU/L   ALT 30 0 - 44 IU/L  Lipid Panel w/o Chol/HDL Ratio out  Result Value Ref Range   Cholesterol, Total 215 (H) 100 - 199 mg/dL   Triglycerides 212 (H) 0 - 149 mg/dL   HDL 67 >39 mg/dL   VLDL Cholesterol Cal 36 5 - 40 mg/dL   LDL Chol Calc (NIH) 112 (H) 0 - 99 mg/dL  UA/M w/rflx Culture, Routine   Specimen: Urine   URINE  Result Value Ref Range   Specific Gravity, UA >1.030 (H) 1.005 - 1.030   pH, UA 5.0 5.0 - 7.5   Color, UA Yellow Yellow   Appearance Ur Clear Clear   Leukocytes,UA Negative Negative   Protein,UA Negative Negative/Trace   Glucose, UA Negative Negative   Ketones, UA Trace (A) Negative   RBC, UA Negative Negative   Bilirubin, UA Negative Negative   Urobilinogen, Ur 0.2 0.2 - 1.0 mg/dL   Nitrite, UA Negative Negative      Assessment & Plan:   Problem List Items Addressed This Visit      Cardiovascular and Mediastinum   Hypertension - Primary    BPs stable and WNL, continue current regimen      Relevant Orders   Comprehensive metabolic panel     Musculoskeletal and Integument   Arthritis of both knees    Stable on prn meloxicam, continue current regimen        Other   Acute gout    Stable and well controlled on allopurinol, continue working on diet modifications      Relevant Orders   Uric acid   Obesity    Discussed diet and exercise changes       Other Visit Diagnoses    Fatigue, unspecified type       Check testosterone levels, will start androgel if low. Reviewed exercise to help maintain good levels as well   Relevant Orders   Testosterone, Free, Total, SHBG       Follow up plan: Return in about 6 months (around 02/10/2020) for 6 month f/u.

## 2019-08-10 NOTE — Assessment & Plan Note (Signed)
Stable and well controlled on allopurinol, continue working on diet modifications

## 2019-08-11 LAB — COMPREHENSIVE METABOLIC PANEL
ALT: 42 IU/L (ref 0–44)
AST: 29 IU/L (ref 0–40)
Albumin/Globulin Ratio: 1.6 (ref 1.2–2.2)
Albumin: 4.2 g/dL (ref 3.8–4.8)
Alkaline Phosphatase: 63 IU/L (ref 39–117)
BUN/Creatinine Ratio: 11 (ref 10–24)
BUN: 14 mg/dL (ref 8–27)
Bilirubin Total: 0.4 mg/dL (ref 0.0–1.2)
CO2: 25 mmol/L (ref 20–29)
Calcium: 9.2 mg/dL (ref 8.6–10.2)
Chloride: 100 mmol/L (ref 96–106)
Creatinine, Ser: 1.28 mg/dL — ABNORMAL HIGH (ref 0.76–1.27)
GFR calc Af Amer: 68 mL/min/{1.73_m2} (ref >59)
GFR calc non Af Amer: 59 mL/min/{1.73_m2} — ABNORMAL LOW (ref >59)
Globulin, Total: 2.7 g/dL (ref 1.5–4.5)
Glucose: 84 mg/dL (ref 65–99)
Potassium: 6.1 mmol/L — ABNORMAL HIGH (ref 3.5–5.2)
Sodium: 138 mmol/L (ref 134–144)
Total Protein: 6.9 g/dL (ref 6.0–8.5)

## 2019-08-11 LAB — URIC ACID: Uric Acid: 4.5 mg/dL (ref 3.8–8.4)

## 2019-08-12 ENCOUNTER — Other Ambulatory Visit: Payer: Self-pay | Admitting: Family Medicine

## 2019-08-12 DIAGNOSIS — E875 Hyperkalemia: Secondary | ICD-10-CM

## 2019-08-20 ENCOUNTER — Other Ambulatory Visit: Payer: Self-pay

## 2019-08-20 DIAGNOSIS — E875 Hyperkalemia: Secondary | ICD-10-CM

## 2019-08-28 LAB — BASIC METABOLIC PANEL
BUN/Creatinine Ratio: 10 (ref 10–24)
BUN: 12 mg/dL (ref 8–27)
CO2: 22 mmol/L (ref 20–29)
Calcium: 9.6 mg/dL (ref 8.6–10.2)
Chloride: 95 mmol/L — ABNORMAL LOW (ref 96–106)
Creatinine, Ser: 1.25 mg/dL (ref 0.76–1.27)
GFR calc Af Amer: 70 mL/min/{1.73_m2} (ref >59)
GFR calc non Af Amer: 60 mL/min/{1.73_m2} (ref >59)
Glucose: 84 mg/dL (ref 65–99)
Potassium: 5.6 mmol/L — ABNORMAL HIGH (ref 3.5–5.2)
Sodium: 134 mmol/L (ref 134–144)

## 2019-08-28 LAB — TESTOSTERONE, FREE, TOTAL, SHBG
Sex Hormone Binding: 32.3 nmol/L (ref 19.3–76.4)
Testosterone, Free: 6.6 pg/mL (ref 6.6–18.1)
Testosterone: 250 ng/dL — ABNORMAL LOW (ref 264–916)

## 2019-08-29 ENCOUNTER — Other Ambulatory Visit: Payer: Self-pay | Admitting: Family Medicine

## 2019-08-29 MED ORDER — TESTOSTERONE 50 MG/5GM (1%) TD GEL: 5.0000 g | Freq: Every day | TRANSDERMAL | 2 refills | Status: DC

## 2019-08-29 MED ORDER — AMLODIPINE BESYLATE 5 MG PO TABS
5.0000 mg | ORAL_TABLET | Freq: Every day | ORAL | 0 refills | Status: DC
Start: 2019-08-29 — End: 2019-11-28

## 2019-08-31 ENCOUNTER — Other Ambulatory Visit: Payer: Self-pay | Admitting: Family Medicine

## 2019-08-31 DIAGNOSIS — I10 Essential (primary) hypertension: Secondary | ICD-10-CM

## 2019-08-31 DIAGNOSIS — M1000 Idiopathic gout, unspecified site: Secondary | ICD-10-CM

## 2019-11-28 ENCOUNTER — Other Ambulatory Visit: Payer: Self-pay | Admitting: Family Medicine

## 2019-11-28 DIAGNOSIS — M1000 Idiopathic gout, unspecified site: Secondary | ICD-10-CM

## 2020-01-04 ENCOUNTER — Other Ambulatory Visit: Payer: Self-pay | Admitting: Nurse Practitioner

## 2020-01-04 ENCOUNTER — Telehealth: Payer: Self-pay

## 2020-01-04 DIAGNOSIS — M170 Bilateral primary osteoarthritis of knee: Secondary | ICD-10-CM

## 2020-01-04 MED ORDER — MELOXICAM 15 MG PO TABS: 15.0000 mg | ORAL_TABLET | Freq: Every day | ORAL | 0 refills | Status: DC

## 2020-01-04 NOTE — Telephone Encounter (Signed)
Please Advise. Last office visit 08/10/2019. Pt should have ran out of medication 06/02/2019.   KP

## 2020-01-04 NOTE — Telephone Encounter (Signed)
Prescription sent in  

## 2020-01-04 NOTE — Telephone Encounter (Signed)
Pt is here for a refill of his meloxicam  Would like it sent to [facility], [place] - [id number] (Ph: [phone]) please advise.

## 2020-02-11 ENCOUNTER — Ambulatory Visit: Payer: Self-pay | Admitting: Family Medicine

## 2020-02-11 ENCOUNTER — Other Ambulatory Visit: Payer: Self-pay

## 2020-02-11 ENCOUNTER — Encounter: Payer: Self-pay | Admitting: Nurse Practitioner

## 2020-02-11 ENCOUNTER — Ambulatory Visit (INDEPENDENT_AMBULATORY_CARE_PROVIDER_SITE_OTHER): Payer: Medicare Other | Admitting: Nurse Practitioner

## 2020-02-11 VITALS — BP 135/83 | HR 89 | Temp 98.8°F | Wt 286.8 lb

## 2020-02-11 DIAGNOSIS — Z0000 Encounter for general adult medical examination without abnormal findings: Secondary | ICD-10-CM

## 2020-02-11 DIAGNOSIS — Z136 Encounter for screening for cardiovascular disorders: Secondary | ICD-10-CM | POA: Diagnosis not present

## 2020-02-11 DIAGNOSIS — I10 Essential (primary) hypertension: Secondary | ICD-10-CM | POA: Diagnosis not present

## 2020-02-11 DIAGNOSIS — Z131 Encounter for screening for diabetes mellitus: Secondary | ICD-10-CM

## 2020-02-11 DIAGNOSIS — Z6841 Body Mass Index (BMI) 40.0 and over, adult: Secondary | ICD-10-CM

## 2020-02-11 DIAGNOSIS — Z8639 Personal history of other endocrine, nutritional and metabolic disease: Secondary | ICD-10-CM | POA: Diagnosis not present

## 2020-02-11 DIAGNOSIS — M170 Bilateral primary osteoarthritis of knee: Secondary | ICD-10-CM | POA: Diagnosis not present

## 2020-02-11 DIAGNOSIS — I482 Chronic atrial fibrillation, unspecified: Secondary | ICD-10-CM | POA: Diagnosis not present

## 2020-02-11 DIAGNOSIS — R351 Nocturia: Secondary | ICD-10-CM

## 2020-02-11 NOTE — Patient Instructions (Signed)

## 2020-02-11 NOTE — Progress Notes (Signed)
BP 135/83   Pulse 89   Temp 98.8 F (37.1 C) (Oral)   Wt 286 lb 12.8 oz (130.1 kg)   SpO2 96%   BMI 41.29 kg/m    Subjective:    Patient ID: Travis Diaz, male    DOB: 11/06/1955, 64 y.o.   MRN: [id number]  HPI: Travis Diaz is a 64 y.o. male presenting on 02/11/2020 for comprehensive medical examination. Current medical complaints include:   KNEE PAIN Currently taking meloxicam 15 mg as needed for knee pain.  Takes less than once weekly. Duration: chronic Involved knee: bilateral Mechanism of injury: unknown Location:diffuse Onset: gradual Severity: moderate  Quality:  Dull ache Frequency: intermittent Radiation: no Aggravating factors: colder weather  Alleviating factors: ice, meloxicam  Status: stable Treatments attempted: rest, meloxicam  Relief with NSAIDs?:  significant Weakness with weight bearing or walking: yes Sensation of giving way: no Locking: no Popping: no Bruising: no Swelling: yes Redness: no Paresthesias/decreased sensation: no Fevers: no  ATRIAL FIBRILLATION Reports was never told he had an irregular heart rhythm - was told he had a "strong heart" in the ED 5 years ago. Atrial fibrillation status: uncontrolled Satisfied with current treatment: not currently on treatment  Medication side effects:  n/a Medication compliance: excellent compliance Etiology of atrial fibrillation: unknown Palpitations:  no Chest pain:  no Dyspnea on exertion:  no Orthopnea:  no Syncope:  no Edema:  yes Ventricular rate control: not currently on anything Anti-coagulation: not currently taking anything  He currently lives with: wife; dog Interim Problems from his last visit: no  Functional Status Survey: Is the patient deaf or have difficulty hearing?: No Does the patient have difficulty seeing, even when wearing glasses/contacts?: No Does the patient have difficulty concentrating, remembering, or making decisions?: No Does the patient have  difficulty walking or climbing stairs?: Yes Does the patient have difficulty dressing or bathing?: No Does the patient have difficulty doing errands alone such as visiting a doctor's office or shopping?: No  FALL RISK: Fall Risk  02/11/2020 02/07/2019 02/01/2018 07/28/2017 01/17/2017  Falls in the past year? 0 0 1 No No  Number falls in past yr: 0 0 0 - -  Injury with Fall? 0 0 0 - -  Risk for fall due to : No Fall Risks - - - -  Follow up Falls evaluation completed - - - -    Depression Screen Depression screen [facility] 2/9 02/11/2020 02/07/2019 02/01/2018 07/28/2017 01/17/2017  Decreased Interest 0 0 0 0 0  Down, Depressed, Hopeless 0 0 0 0 0  PHQ - 2 Score 0 0 0 0 0  Altered sleeping - 0 - - -  Tired, decreased energy - 0 - - -  Change in appetite - 2 - - -  Feeling bad or failure about yourself  - 0 - - -  Trouble concentrating - 0 - - -  Moving slowly or fidgety/restless - 0 - - -  Suicidal thoughts - 0 - - -  PHQ-9 Score - 2 - - -    Advanced Directives <no information>  Past Medical History:  Past Medical History:  Diagnosis Date  . Gout   . Hypertension     Surgical History:  Past Surgical History:  Procedure Laterality Date  . broken arm    . COLONOSCOPY WITH PROPOFOL N/A 05/14/2016   Procedure: COLONOSCOPY WITH PROPOFOL;  Surgeon: Manya Silvas, MD;  Location: [facility] ENDOSCOPY;  Service: Endoscopy;  Laterality: N/A;  . upper  leg procedure Right    removal of blood clots    Medications:  Current Outpatient Medications on File Prior to Visit  Medication Sig  . allopurinol (ZYLOPRIM) 300 MG tablet Take 1 tablet by mouth once daily  . amLODipine (NORVASC) 5 MG tablet Take 1 tablet by mouth once daily  . benazepril (LOTENSIN) 40 MG tablet Take 1 tablet by mouth once daily (Patient taking differently: Take 20 mg by mouth daily. )  . Flaxseed, Linseed, (FLAXSEED OIL) 1000 MG CAPS Take 1,000 mg by mouth daily.   . meloxicam (MOBIC) 15 MG tablet Take 1 tablet (15 mg  total) by mouth daily.   No current facility-administered medications on file prior to visit.    Allergies:  Allergies  Allergen Reactions  . Sulfur Other (See Comments)    Was told by his mother    Social History:  Social History   Socioeconomic History  . Marital status: Married    Spouse name: Not on file  . Number of children: Not on file  . Years of education: Not on file  . Highest education level: Not on file  Occupational History  . Not on file  Tobacco Use  . Smoking status: Former Smoker    Types: Cigarettes    Quit date: 03/29/2004    Years since quitting: 15.8  . Smokeless tobacco: Never Used  Vaping Use  . Vaping Use: Never used  Substance and Sexual Activity  . Alcohol use: Yes    Alcohol/week: 24.0 standard drinks    Types: 24 Cans of beer per week  . Drug use: No  . Sexual activity: Yes  Other Topics Concern  . Not on file  Social History Narrative  . Not on file   Social Determinants of Health   Financial Resource Strain:   . Difficulty of Paying Living Expenses: Not on file  Food Insecurity:   . Worried About Charity fundraiser in the Last Year: Not on file  . Ran Out of Food in the Last Year: Not on file  Transportation Needs:   . Lack of Transportation (Medical): Not on file  . Lack of Transportation (Non-Medical): Not on file  Physical Activity:   . Days of Exercise per Week: Not on file  . Minutes of Exercise per Session: Not on file  Stress:   . Feeling of Stress : Not on file  Social Connections:   . Frequency of Communication with Friends and Family: Not on file  . Frequency of Social Gatherings with Friends and Family: Not on file  . Attends Religious Services: Not on file  . Active Member of Clubs or Organizations: Not on file  . Attends Archivist Meetings: Not on file  . Marital Status: Not on file  Intimate Partner Violence:   . Fear of Current or Ex-Partner: Not on file  . Emotionally Abused: Not on file  .  Physically Abused: Not on file  . Sexually Abused: Not on file   Social History   Tobacco Use  Smoking Status Former Smoker  . Types: Cigarettes  . Quit date: 03/29/2004  . Years since quitting: 15.8  Smokeless Tobacco Never Used   Social History   Substance and Sexual Activity  Alcohol Use Yes  . Alcohol/week: 24.0 standard drinks  . Types: 24 Cans of beer per week    Family History:  Family History  Problem Relation Age of Onset  . Cancer Mother  male  . Heart disease Father   . Cancer Father   . Early death Father   . Heart attack Father   . Diabetes Sister   . Dementia Maternal Grandmother   . Heart attack Maternal Grandfather   . Heart attack Paternal Grandmother   . Early death Paternal Grandfather   . Heart attack Paternal Grandfather     Past medical history, surgical history, medications, allergies, family history and social history reviewed with patient today and changes made to appropriate areas of the chart.   Review of Systems - Negative except occasional knee pain, weight gain All other ROS negative except what is listed above and in the HPI.      Objective:    BP 135/83   Pulse 89   Temp 98.8 F (37.1 C) (Oral)   Wt 286 lb 12.8 oz (130.1 kg)   SpO2 96%   BMI 41.29 kg/m   Wt Readings from Last 3 Encounters:  02/11/20 286 lb 12.8 oz (130.1 kg)  08/10/19 274 lb 2 oz (124.3 kg)  02/07/19 265 lb (120.2 kg)    Physical Exam Constitutional:      General: He is not in acute distress.    Appearance: Normal appearance. He is obese. He is not toxic-appearing.  HENT:     Head: Normocephalic.     Right Ear: Tympanic membrane, ear canal and external ear normal. There is no impacted cerumen.     Left Ear: Tympanic membrane, ear canal and external ear normal. There is no impacted cerumen.     Nose: Nose normal. No congestion.     Mouth/Throat:     Mouth: Mucous membranes are moist.     Pharynx: Oropharynx is clear. No oropharyngeal exudate or  posterior oropharyngeal erythema.  Eyes:     General: No scleral icterus.    Extraocular Movements: Extraocular movements intact.     Pupils: Pupils are equal, round, and reactive to light.  Neck:     Vascular: No carotid bruit.  Cardiovascular:     Rate and Rhythm: Normal rate and regular rhythm.     Heart sounds: Normal heart sounds. No murmur heard.  No gallop.   Pulmonary:     Effort: Pulmonary effort is normal. No respiratory distress.     Breath sounds: Normal breath sounds. No wheezing or rhonchi.  Abdominal:     General: Abdomen is flat. Bowel sounds are normal. There is no distension.     Palpations: Abdomen is soft.     Tenderness: There is no abdominal tenderness.  Genitourinary:    Comments: Deferred using shared decision-making. Musculoskeletal:        General: No swelling or tenderness. Normal range of motion.     Cervical back: Normal range of motion and neck supple. No tenderness.     Right lower leg: 1+ Pitting Edema present.     Left lower leg: 1+ Pitting Edema present.  Skin:    General: Skin is warm and dry.     Coloration: Skin is not jaundiced.     Findings: No lesion.  Neurological:     General: No focal deficit present.     Mental Status: He is alert and oriented to person, place, and time. Mental status is at baseline.  Psychiatric:        Mood and Affect: Mood normal.        Behavior: Behavior normal.        Thought Content: Thought content normal.  Judgment: Judgment normal.     Cognitive Testing - 6-CIT  Correct? Score   What year is it? yes 0 Yes = 0    No = 4  What month is it? yes 0 Yes = 0    No = 3  Remember:     Pia [address]au, [place], [place]     What time is it? yes 0 Yes = 0    No = 3  Count backwards from 20 to 1 yes 0 Correct = 0    1 error = 2   More than 1 error = 4  Say the months of the year in reverse. yes 0 Correct = 0    1 error = 2   More than 1 error = 4  What address did I ask you to remember? yes 0 Correct = 0   1 error = 2    2 error = 4    3 error = 6    4 error = 8    All wrong = 10       TOTAL SCORE  0/28   Interpretation:  Normal  Normal (0-7) Abnormal (8-28)       Assessment & Plan:   Problem List Items Addressed This Visit      Cardiovascular and Mediastinum   Chronic atrial fibrillation (HCC)    Chronic, ongoing.  Last EKG in 2016 showed A. fib, however patient reports he was never told he had this.  EKG today again shows A. fib with low voltage.  Discussed treatment for this extensively including use of rate control medication and blood thinners to reduce risk of stroke.  Patient reports he is not interested in treatment with blood thinners at this time and declines a referral to cardiology for further discussion.  Heart rate is controlled in clinic-rate control not indicated at this time.           Musculoskeletal and Integument   Arthritis of both knees    Chronic, stable with very seldom use of meloxicam 15 mg.  We will continue this medication for now, refills given.  Patient to follow-up if pain persistent and not controlled with meloxicam.        Other   Obesity    Chronic, ongoing.  BMI greater than 41 today.  Has gained about 20 pounds in 1 year.  Counseled extensively on increasing physical activity, however patient reports he is not going to change his diet.  Agreeable to increase physical activity with goal of physical activity 30 minutes 5 times weekly.  Somewhat concerned that drastic weight gain may be related to cardiac dysfunction-has been in A. fib for a number of years without any treatment.  At future visit, further cardiac work-up may be indicated if weight gain/swelling persistent.  Lipids, CMP, CBC, hemoglobin A1c today.  Follow-up in 2 weeks.      Relevant Orders   Hemoglobin A1c (Completed)    Other Visit Diagnoses    Welcome to Medicare preventive visit    -  Primary   Relevant Orders   EKG 12-Lead (Completed)   PSA (Completed)   Hemoglobin A1c  (Completed)   Essential hypertension       Relevant Orders   PSA (Completed)   CBC with Differential/Platelet (Completed)   Comprehensive metabolic panel (Completed)   Encounter for screening for cardiovascular disorders       Relevant Orders   Lipid Panel w/o Chol/HDL Ratio (Completed)   Screening  for diabetes mellitus       Relevant Orders   Hemoglobin A1c (Completed)   Personal history of other endocrine, nutritional and metabolic disease        Relevant Orders   Hemoglobin A1c (Completed)   Nocturia        Relevant Orders   PSA (Completed)       Preventative Services:  Health Risk Assessment and Personalized Prevention Plan: Bone Mass Measurements: not applicable CVD Screening: done today Colon Cancer Screening: up to date Depression Screening: completed today Diabetes Screening: done today Glaucoma Screening: see your eye Doctor Hepatitis B vaccine: not applicable Hepatitis C screening: up to date HIV Screening: up to date Flu Vaccine: declined Lung cancer Screening: not applicable Obesity Screening: BMI 41; discussed diet and lifestyle modifications Pneumonia Vaccines (2): refused STI Screening: declined PSA screening: done today  Discussed aspirin prophylaxis for myocardial infarction prevention and decision was it was not indicated  LABORATORY TESTING:  Health maintenance labs ordered today as discussed above.   The natural history of prostate cancer and ongoing controversy regarding screening and potential treatment outcomes of prostate cancer has been discussed with the patient. The meaning of a false positive PSA and a false negative PSA has been discussed. He indicates understanding of the limitations of this screening test and wishes to proceed with screening PSA testing.   IMMUNIZATIONS:   - Tdap: Tetanus vaccination status reviewed: last tetanus booster within 10 years. - Influenza: Refused - Pneumovax: Refused - Prevnar: Refused - Zostavax vaccine:  Refused  - COVID-19 vaccine: Refused  SCREENING: - Colonoscopy: Up to date  Discussed with patient purpose of the colonoscopy is to detect colon cancer at curable precancerous or early stages   - AAA Screening: Not applicable  -Hearing Test: Ordered today  -Spirometry: Not applicable   PATIENT COUNSELING:    Sexuality: Discussed sexually transmitted diseases, partner selection, use of condoms, avoidance of unintended pregnancy  and contraceptive alternatives.   Advised to avoid cigarette smoking.  I discussed with the patient that most people either abstain from alcohol or drink within safe limits (<=14/week and <=4 drinks/occasion for males, <=7/weeks and <= 3 drinks/occasion for females) and that the risk for alcohol disorders and other health effects rises proportionally with the number of drinks per week and how often a drinker exceeds daily limits.  Discussed cessation/primary prevention of drug use and availability of treatment for abuse.   Diet: Encouraged to adjust caloric intake to maintain  or achieve ideal body weight, to reduce intake of dietary saturated fat and total fat, to limit sodium intake by avoiding high sodium foods and not adding table salt, and to maintain adequate dietary potassium and calcium preferably from fresh fruits, vegetables, and low-fat dairy products.    stressed the importance of regular exercise  Injury prevention: Discussed safety belts, safety helmets, smoke detector, smoking near bedding or upholstery.   Dental health: Discussed importance of regular tooth brushing, flossing, and dental visits.   Follow up plan: NEXT PREVENTATIVE PHYSICAL DUE IN 1 YEAR. Return in about 2 weeks (around 02/25/2020) for chronic disease follow up.

## 2020-02-12 ENCOUNTER — Telehealth: Payer: Self-pay | Admitting: Nurse Practitioner

## 2020-02-12 DIAGNOSIS — I482 Chronic atrial fibrillation, unspecified: Secondary | ICD-10-CM | POA: Insufficient documentation

## 2020-02-12 DIAGNOSIS — I4891 Unspecified atrial fibrillation: Secondary | ICD-10-CM | POA: Insufficient documentation

## 2020-02-12 LAB — COMPREHENSIVE METABOLIC PANEL
ALT: 29 IU/L (ref 0–44)
AST: 26 IU/L (ref 0–40)
Albumin/Globulin Ratio: 1.7 (ref 1.2–2.2)
Albumin: 4.2 g/dL (ref 3.8–4.8)
Alkaline Phosphatase: 56 IU/L (ref 44–121)
BUN/Creatinine Ratio: 10 (ref 10–24)
BUN: 11 mg/dL (ref 8–27)
Bilirubin Total: 0.4 mg/dL (ref 0.0–1.2)
CO2: 24 mmol/L (ref 20–29)
Calcium: 9 mg/dL (ref 8.6–10.2)
Chloride: 96 mmol/L (ref 96–106)
Creatinine, Ser: 1.15 mg/dL (ref 0.76–1.27)
GFR calc Af Amer: 77 mL/min/{1.73_m2} (ref >59)
GFR calc non Af Amer: 66 mL/min/{1.73_m2} (ref >59)
Globulin, Total: 2.5 g/dL (ref 1.5–4.5)
Glucose: 82 mg/dL (ref 65–99)
Potassium: 5.6 mmol/L — ABNORMAL HIGH (ref 3.5–5.2)
Sodium: 135 mmol/L (ref 134–144)
Total Protein: 6.7 g/dL (ref 6.0–8.5)

## 2020-02-12 LAB — CBC WITH DIFFERENTIAL/PLATELET
Basophils Absolute: 0.1 10*3/uL (ref 0.0–0.2)
Basos: 1 %
EOS (ABSOLUTE): 0.3 10*3/uL (ref 0.0–0.4)
Eos: 4 %
Hematocrit: 46.3 % (ref 37.5–51.0)
Hemoglobin: 16 g/dL (ref 13.0–17.7)
Immature Grans (Abs): 0.1 10*3/uL (ref 0.0–0.1)
Immature Granulocytes: 2 %
Lymphocytes Absolute: 1.3 10*3/uL (ref 0.7–3.1)
Lymphs: 15 %
MCH: 32.2 pg (ref 26.6–33.0)
MCHC: 34.6 g/dL (ref 31.5–35.7)
MCV: 93 fL (ref 79–97)
Monocytes Absolute: 0.8 10*3/uL (ref 0.1–0.9)
Monocytes: 10 %
Neutrophils Absolute: 6 10*3/uL (ref 1.4–7.0)
Neutrophils: 68 %
Platelets: 251 10*3/uL (ref 150–450)
RBC: 4.97 x10E6/uL (ref 4.14–5.80)
RDW: 13.1 % (ref 11.6–15.4)
WBC: 8.6 10*3/uL (ref 3.4–10.8)

## 2020-02-12 LAB — LIPID PANEL W/O CHOL/HDL RATIO
Cholesterol, Total: 214 mg/dL — ABNORMAL HIGH (ref 100–199)
HDL: 50 mg/dL (ref >39)
LDL Chol Calc (NIH): 129 mg/dL — ABNORMAL HIGH (ref 0–99)
Triglycerides: 199 mg/dL — ABNORMAL HIGH (ref 0–149)
VLDL Cholesterol Cal: 35 mg/dL (ref 5–40)

## 2020-02-12 LAB — PSA: Prostate Specific Ag, Serum: 1.3 ng/mL (ref 0.0–4.0)

## 2020-02-12 LAB — HEMOGLOBIN A1C
Est. average glucose Bld gHb Est-mCnc: 114 mg/dL
Hgb A1c MFr Bld: 5.6 % (ref 4.8–5.6)

## 2020-02-12 NOTE — Assessment & Plan Note (Signed)
Chronic, ongoing.  Last EKG in 2016 showed A. fib, however patient reports he was never told he had this.  EKG today again shows A. fib with low voltage.  Discussed treatment for this extensively including use of rate control medication and blood thinners to reduce risk of stroke.  Patient reports he is not interested in treatment with blood thinners at this time and declines a referral to cardiology for further discussion.  Heart rate is controlled in clinic-rate control not indicated at this time.

## 2020-02-12 NOTE — Assessment & Plan Note (Signed)
Chronic, stable with very seldom use of meloxicam 15 mg.  We will continue this medication for now, refills given.  Patient to follow-up if pain persistent and not controlled with meloxicam.

## 2020-02-12 NOTE — Assessment & Plan Note (Addendum)
Chronic, ongoing.  BMI greater than 41 today.  Has gained about 20 pounds in 1 year.  Counseled extensively on increasing physical activity, however patient reports he is not going to change his diet.  Agreeable to increase physical activity with goal of physical activity 30 minutes 5 times weekly.  Somewhat concerned that drastic weight gain may be related to cardiac dysfunction-has been in A. fib for a number of years without any treatment.  At future visit, further cardiac work-up may be indicated if weight gain/swelling persistent.  Lipids, CMP, CBC, hemoglobin A1c today.  Follow-up in 2 weeks.

## 2020-02-12 NOTE — Telephone Encounter (Signed)
Called patient and discussed labs. - Potassium remains elevated, kidney function completely normal.  Will decrease benazepril to 10 mg and recheck potassium at follow up visit. -Blood counts, PSA normal - HgbA1c borderline prediabetic.  Encouraged being mindful of consumption of simple sugars, sweets, and carbohydrates.  - Cholesterol levels remain elevated. The 10-year ASCVD risk score Travis Diaz DC Brooke Bonito., et al., 2013) is: 16.9%   Values used to calculate the score:     Age: 64 years     Sex: Male     Is Non-Hispanic African American: No     Diabetic: No     Tobacco smoker: No     Systolic Blood Pressure: 242 mmHg     Is BP treated: Yes     HDL Cholesterol: 50 mg/dL     Total Cholesterol: 214 mg/dL I explained that his risk to have a cardiovascular event like heart attack or stroke is near 20% within the next 10 years.  I recommended starting on a statin to help lower this risk.  Patient verbalized understanding but is not interested in starting on new medication at this time and declined.  Also does not want to work on changing his diet to include less red meat/fatty foods.  Will try to increase physical activity.

## 2020-02-28 ENCOUNTER — Other Ambulatory Visit: Payer: Self-pay

## 2020-02-28 ENCOUNTER — Ambulatory Visit (INDEPENDENT_AMBULATORY_CARE_PROVIDER_SITE_OTHER): Payer: Medicare Other | Admitting: Nurse Practitioner

## 2020-02-28 ENCOUNTER — Encounter: Payer: Self-pay | Admitting: Nurse Practitioner

## 2020-02-28 VITALS — BP 135/88 | HR 100 | Wt 285.0 lb

## 2020-02-28 DIAGNOSIS — M1000 Idiopathic gout, unspecified site: Secondary | ICD-10-CM | POA: Diagnosis not present

## 2020-02-28 DIAGNOSIS — I10 Essential (primary) hypertension: Secondary | ICD-10-CM | POA: Diagnosis not present

## 2020-02-28 MED ORDER — ALLOPURINOL 300 MG PO TABS: 300.0000 mg | ORAL_TABLET | Freq: Every day | ORAL | 1 refills | Status: DC

## 2020-02-28 MED ORDER — AMLODIPINE BESYLATE 10 MG PO TABS: 10.0000 mg | ORAL_TABLET | Freq: Every day | ORAL | 1 refills | Status: DC

## 2020-02-28 NOTE — Assessment & Plan Note (Addendum)
Chronic, stable.  BP near goal in clinic today.  Will stop benazepril completely given ongoing hyperkalemia and increase amlodipine to 10 mg daily.  Refills given.  Encouraged patient to check BP at home and notify clinic if >140/90.  BMET rechecked today.  Follow up in 4 weeks.

## 2020-02-28 NOTE — Patient Instructions (Signed)
DASH Eating Plan DASH stands for "Dietary Approaches to Stop Hypertension." The DASH eating plan is a healthy eating plan that has been shown to reduce high blood pressure (hypertension). It may also reduce your risk for type 2 diabetes, heart disease, and stroke. The DASH eating plan may also help with weight loss. What are tips for following this plan?  General guidelines  Avoid eating more than 2,300 mg (milligrams) of salt (sodium) a day. If you have hypertension, you may need to reduce your sodium intake to 1,500 mg a day.  Limit alcohol intake to no more than 1 drink a day for nonpregnant women and 2 drinks a day for men. One drink equals 12 oz of beer, 5 oz of wine, or 1 oz of hard liquor.  Work with your health care provider to maintain a healthy body weight or to lose weight. Ask what an ideal weight is for you.  Get at least 30 minutes of exercise that causes your heart to beat faster (aerobic exercise) most days of the week. Activities may include walking, swimming, or biking.  Work with your health care provider or diet and nutrition specialist (dietitian) to adjust your eating plan to your individual calorie needs. Reading food labels   Check food labels for the amount of sodium per serving. Choose foods with less than 5 percent of the Daily Value of sodium. Generally, foods with less than 300 mg of sodium per serving fit into this eating plan.  To find whole grains, look for the word "whole" as the first word in the ingredient list. Shopping  Buy products labeled as "low-sodium" or "no salt added."  Buy fresh foods. Avoid canned foods and premade or frozen meals. Cooking  Avoid adding salt when cooking. Use salt-free seasonings or herbs instead of table salt or sea salt. Check with your health care provider or pharmacist before using salt substitutes.  Do not fry foods. Cook foods using healthy methods such as baking, boiling, grilling, and broiling instead.  Cook with  heart-healthy oils, such as olive, canola, soybean, or sunflower oil. Meal planning  Eat a balanced diet that includes: ? 5 or more servings of fruits and vegetables each day. At each meal, try to fill half of your plate with fruits and vegetables. ? Up to 6-8 servings of whole grains each day. ? Less than 6 oz of lean meat, poultry, or fish each day. A 3-oz serving of meat is about the same size as a deck of cards. One egg equals 1 oz. ? 2 servings of low-fat dairy each day. ? A serving of nuts, seeds, or beans 5 times each week. ? Heart-healthy fats. Healthy fats called Omega-3 fatty acids are found in foods such as flaxseeds and coldwater fish, like sardines, salmon, and mackerel.  Limit how much you eat of the following: ? Canned or prepackaged foods. ? Food that is high in trans fat, such as fried foods. ? Food that is high in saturated fat, such as fatty meat. ? Sweets, desserts, sugary drinks, and other foods with added sugar. ? Full-fat dairy products.  Do not salt foods before eating.  Try to eat at least 2 vegetarian meals each week.  Eat more home-cooked food and less restaurant, buffet, and fast food.  When eating at a restaurant, ask that your food be prepared with less salt or no salt, if possible. What foods are recommended? The items listed may not be a complete list. Talk with your dietitian about   what dietary choices are best for you. Grains Whole-grain or whole-wheat bread. Whole-grain or whole-wheat pasta. Brown rice. Oatmeal. Quinoa. Bulgur. Whole-grain and low-sodium cereals. Pita bread. Low-fat, low-sodium crackers. Whole-wheat flour tortillas. Vegetables Fresh or frozen vegetables (raw, steamed, roasted, or grilled). Low-sodium or reduced-sodium tomato and vegetable juice. Low-sodium or reduced-sodium tomato sauce and tomato paste. Low-sodium or reduced-sodium canned vegetables. Fruits All fresh, dried, or frozen fruit. Canned fruit in natural juice (without  added sugar). Meat and other protein foods Skinless chicken or turkey. Ground chicken or turkey. Pork with fat trimmed off. Fish and seafood. Egg whites. Dried beans, peas, or lentils. Unsalted nuts, nut butters, and seeds. Unsalted canned beans. Lean cuts of beef with fat trimmed off. Low-sodium, lean deli meat. Dairy Low-fat (1%) or fat-free (skim) milk. Fat-free, low-fat, or reduced-fat cheeses. Nonfat, low-sodium ricotta or cottage cheese. Low-fat or nonfat yogurt. Low-fat, low-sodium cheese. Fats and oils Soft margarine without trans fats. Vegetable oil. Low-fat, reduced-fat, or light mayonnaise and salad dressings (reduced-sodium). Canola, safflower, olive, soybean, and sunflower oils. Avocado. Seasoning and other foods Herbs. Spices. Seasoning mixes without salt. Unsalted popcorn and pretzels. Fat-free sweets. What foods are not recommended? The items listed may not be a complete list. Talk with your dietitian about what dietary choices are best for you. Grains Baked goods made with fat, such as croissants, muffins, or some breads. Dry pasta or rice meal packs. Vegetables Creamed or fried vegetables. Vegetables in a cheese sauce. Regular canned vegetables (not low-sodium or reduced-sodium). Regular canned tomato sauce and paste (not low-sodium or reduced-sodium). Regular tomato and vegetable juice (not low-sodium or reduced-sodium). Pickles. Olives. Fruits Canned fruit in a light or heavy syrup. Fried fruit. Fruit in cream or butter sauce. Meat and other protein foods Fatty cuts of meat. Ribs. Fried meat. Bacon. Sausage. Bologna and other processed lunch meats. Salami. Fatback. Hotdogs. Bratwurst. Salted nuts and seeds. Canned beans with added salt. Canned or smoked fish. Whole eggs or egg yolks. Chicken or turkey with skin. Dairy Whole or 2% milk, cream, and half-and-half. Whole or full-fat cream cheese. Whole-fat or sweetened yogurt. Full-fat cheese. Nondairy creamers. Whipped toppings.  Processed cheese and cheese spreads. Fats and oils Butter. Stick margarine. Lard. Shortening. Ghee. Bacon fat. Tropical oils, such as coconut, palm kernel, or palm oil. Seasoning and other foods Salted popcorn and pretzels. Onion salt, garlic salt, seasoned salt, table salt, and sea salt. Worcestershire sauce. Tartar sauce. Barbecue sauce. Teriyaki sauce. Soy sauce, including reduced-sodium. Steak sauce. Canned and packaged gravies. Fish sauce. Oyster sauce. Cocktail sauce. Horseradish that you find on the shelf. Ketchup. Mustard. Meat flavorings and tenderizers. Bouillon cubes. Hot sauce and Tabasco sauce. Premade or packaged marinades. Premade or packaged taco seasonings. Relishes. Regular salad dressings. Where to find more information:  National Heart, Lung, and Blood Institute: www.nhlbi.nih.gov  American Heart Association: www.heart.org Summary  The DASH eating plan is a healthy eating plan that has been shown to reduce high blood pressure (hypertension). It may also reduce your risk for type 2 diabetes, heart disease, and stroke.  With the DASH eating plan, you should limit salt (sodium) intake to 2,300 mg a day. If you have hypertension, you may need to reduce your sodium intake to 1,500 mg a day.  When on the DASH eating plan, aim to eat more fresh fruits and vegetables, whole grains, lean proteins, low-fat dairy, and heart-healthy fats.  Work with your health care provider or diet and nutrition specialist (dietitian) to adjust your eating plan to your   individual calorie needs. This information is not intended to replace advice given to you by your health care provider. Make sure you discuss any questions you have with your health care provider. Document Revised: 02/25/2017 Document Reviewed: 03/08/2016 Elsevier Patient Education  2020 Elsevier Inc.  

## 2020-02-28 NOTE — Progress Notes (Signed)
BP 135/88    Pulse 100    Wt 286 lb (129.7 kg)    SpO2 98%    BMI 41.18 kg/m    Subjective:    Patient ID: Travis Diaz, male    DOB: December 09, 1955, 64 y.o.   MRN: [id number]  HPI: Travis Diaz is a 64 y.o. male presenting for chronic disease follow up.  Chief Complaint  Patient presents with   Hypertension   HYPERTENSION Has been working out and eating less dairy and less carbohydrates.  Has been using the stationary bike for exercise.  Does report he overate at Thanksgiving.  Would like to completely stop the benazepril.  Is currently taking amlodipine 5 mg daily and benazepril 10 mg. Hypertension status: controlled  Satisfied with current treatment? no Duration of hypertension: chronic BP monitoring frequency:  not checking BP medication side effects:  no Medication compliance: excellent Aspirin: no Recurrent headaches: no Visual changes: no Palpitations: no Dyspnea: no Chest pain: no Lower extremity edema: no Dizzy/lightheaded: no   GOUT Has not had a flare since he started the allopurinol years ago. Duration:chronic Right 1st metatarsophalangeal pain: no Left 1st metatarsophalangeal pain: no Right knee pain: no Left knee pain: no Severity: no pain  Quality: no pain  Swelling: no Redness: no Trauma: no Recent dietary change or indiscretion: no Fevers: no Nausea/vomiting: no Aggravating factors: nothing Alleviating factors: taking medication Status:  stable Treatments attempted: allopurinol daily   Allergies  Allergen Reactions   Sulfur Other (See Comments)    Was told by his mother   Outpatient Encounter Medications as of 02/28/2020  Medication Sig Note   allopurinol (ZYLOPRIM) 300 MG tablet Take 1 tablet (300 mg total) by mouth daily.    amLODipine (NORVASC) 10 MG tablet Take 1 tablet (10 mg total) by mouth daily.    Flaxseed, Linseed, (FLAXSEED OIL) 1000 MG CAPS Take 1,000 mg by mouth daily.  11/27/2014: Received from: [facility]   meloxicam (MOBIC) 15 MG tablet Take 1 tablet (15 mg total) by mouth daily.    [DISCONTINUED] allopurinol (ZYLOPRIM) 300 MG tablet Take 1 tablet by mouth once daily    [DISCONTINUED] amLODipine (NORVASC) 5 MG tablet Take 1 tablet by mouth once daily    [DISCONTINUED] benazepril (LOTENSIN) 40 MG tablet Take 1 tablet by mouth once daily (Patient taking differently: Take 20 mg by mouth daily. )    No facility-administered encounter medications on file as of 02/28/2020.   Patient Active Problem List   Diagnosis Date Noted   Chronic atrial fibrillation ([place]) 02/12/2020   Obesity 08/10/2019   Arthritis of both knees 01/09/2015   Hypertension 11/27/2014   Acute gout 09/20/2014   Relevant past medical, surgical, family and social history reviewed and updated as indicated. Interim medical history since our last visit reviewed. Allergies and medications reviewed and updated.  Review of Systems  Constitutional: Negative.  Negative for activity change, appetite change, diaphoresis, fatigue and fever.  Eyes: Negative.  Negative for visual disturbance.  Respiratory: Negative.  Negative for cough, shortness of breath and wheezing.   Cardiovascular: Positive for leg swelling (improved). Negative for chest pain and palpitations.  Musculoskeletal: Negative.   Neurological: Negative.  Negative for dizziness, weakness, light-headedness and headaches.  Psychiatric/Behavioral: Negative.  Negative for confusion, self-injury and sleep disturbance.    Per HPI unless specifically indicated above     Objective:    BP 135/88    Pulse 100    Wt 286 lb (129.7  kg)    SpO2 98%    BMI 41.18 kg/m   Wt Readings from Last 3 Encounters:  02/28/20 286 lb (129.7 kg)  02/11/20 286 lb 12.8 oz (130.1 kg)  08/10/19 274 lb 2 oz (124.3 kg)    Physical Exam Vitals and nursing note reviewed.  Constitutional:      General: He is not in acute distress.    Appearance: Normal appearance. He is  obese. He is not toxic-appearing.  Eyes:     General: No scleral icterus.       Right eye: No discharge.        Left eye: No discharge.     Extraocular Movements: Extraocular movements intact.  Cardiovascular:     Rate and Rhythm: Normal rate and regular rhythm.     Heart sounds: Normal heart sounds. No murmur heard.   Pulmonary:     Effort: Pulmonary effort is normal. No respiratory distress.     Breath sounds: Normal breath sounds. No wheezing or rhonchi.  Skin:    General: Skin is warm and dry.     Coloration: Skin is not jaundiced or pale.     Findings: No erythema.  Neurological:     General: No focal deficit present.     Mental Status: He is alert and oriented to person, place, and time.     Motor: No weakness.     Gait: Gait normal.  Psychiatric:        Mood and Affect: Mood normal.        Behavior: Behavior normal.        Thought Content: Thought content normal.        Judgment: Judgment normal.        Assessment & Plan:   Problem List Items Addressed This Visit      Cardiovascular and Mediastinum   Hypertension    Chronic, stable.  BP near goal in clinic today.  Will stop benazepril completely given ongoing hyperkalemia and increase amlodipine to 10 mg daily.  Refills given.  Encouraged patient to check BP at home and notify clinic if >140/90.  BMET rechecked today.  Follow up in 4 weeks.      Relevant Medications   amLODipine (NORVASC) 10 MG tablet     Other   Acute gout    Chronic, stable.  Will check uric acid level today and continue allopurinol 300 mg daily.  Refills given.  Follow up in 6 months.  May consider stopping allopurinol in future.      Relevant Medications   allopurinol (ZYLOPRIM) 300 MG tablet   Other Relevant Orders   Uric acid    Other Visit Diagnoses    Essential hypertension    -  Primary   Relevant Medications   amLODipine (NORVASC) 10 MG tablet   Other Relevant Orders   Basic Metabolic Panel (BMET)       Follow up  plan: Return in about 4 weeks (around 03/27/2020) for BP and potassium f/u.

## 2020-02-28 NOTE — Assessment & Plan Note (Addendum)
Chronic, stable.  Will check uric acid level today and continue allopurinol 300 mg daily.  Refills given.  Follow up in 6 months.  May consider stopping allopurinol in future.

## 2020-02-29 LAB — BASIC METABOLIC PANEL
BUN/Creatinine Ratio: 11 (ref 10–24)
BUN: 13 mg/dL (ref 8–27)
CO2: 23 mmol/L (ref 20–29)
Calcium: 9.3 mg/dL (ref 8.6–10.2)
Chloride: 97 mmol/L (ref 96–106)
Creatinine, Ser: 1.22 mg/dL (ref 0.76–1.27)
GFR calc Af Amer: 71 mL/min/{1.73_m2} (ref >59)
GFR calc non Af Amer: 62 mL/min/{1.73_m2} (ref >59)
Glucose: 93 mg/dL (ref 65–99)
Potassium: 5.5 mmol/L — ABNORMAL HIGH (ref 3.5–5.2)
Sodium: 135 mmol/L (ref 134–144)

## 2020-02-29 LAB — URIC ACID: Uric Acid: 4.5 mg/dL (ref 3.8–8.4)

## 2020-03-03 ENCOUNTER — Encounter: Payer: Self-pay | Admitting: Nurse Practitioner

## 2020-03-03 NOTE — Progress Notes (Signed)
Letter out for labs

## 2020-09-01 ENCOUNTER — Other Ambulatory Visit: Payer: Self-pay

## 2020-09-01 DIAGNOSIS — I10 Essential (primary) hypertension: Secondary | ICD-10-CM

## 2020-09-01 DIAGNOSIS — M1000 Idiopathic gout, unspecified site: Secondary | ICD-10-CM

## 2020-09-01 NOTE — Telephone Encounter (Signed)
Refill request received for Allopurinol 300 mg and Amlodipine 10 mg.   LOV 12/21  Up coming app 09/12/19

## 2020-09-02 MED ORDER — AMLODIPINE BESYLATE 10 MG PO TABS: 10.0000 mg | ORAL_TABLET | Freq: Every day | ORAL | 0 refills | Status: DC

## 2020-09-02 MED ORDER — ALLOPURINOL 300 MG PO TABS: 300.0000 mg | ORAL_TABLET | Freq: Every day | ORAL | 0 refills | Status: DC

## 2020-09-11 ENCOUNTER — Other Ambulatory Visit: Payer: Self-pay

## 2020-09-11 ENCOUNTER — Encounter: Payer: Self-pay | Admitting: Internal Medicine

## 2020-09-11 ENCOUNTER — Ambulatory Visit (INDEPENDENT_AMBULATORY_CARE_PROVIDER_SITE_OTHER): Payer: Medicare Other | Admitting: Internal Medicine

## 2020-09-11 VITALS — BP 155/98 | HR 103 | Temp 98.7°F | Ht 69.49 in | Wt 282.4 lb

## 2020-09-11 DIAGNOSIS — I10 Essential (primary) hypertension: Secondary | ICD-10-CM

## 2020-09-11 DIAGNOSIS — M1000 Idiopathic gout, unspecified site: Secondary | ICD-10-CM | POA: Diagnosis not present

## 2020-09-11 DIAGNOSIS — Z6839 Body mass index (BMI) 39.0-39.9, adult: Secondary | ICD-10-CM

## 2020-09-11 DIAGNOSIS — I482 Chronic atrial fibrillation, unspecified: Secondary | ICD-10-CM

## 2020-09-11 DIAGNOSIS — Z125 Encounter for screening for malignant neoplasm of prostate: Secondary | ICD-10-CM | POA: Diagnosis not present

## 2020-09-11 MED ORDER — CEPHALEXIN 500 MG PO CAPS: 500.0000 mg | ORAL_CAPSULE | Freq: Two times a day (BID) | ORAL | 0 refills | Status: AC

## 2020-09-11 MED ORDER — BENAZEPRIL HCL 5 MG PO TABS: 5.0000 mg | ORAL_TABLET | Freq: Every day | ORAL | 5 refills | Status: DC

## 2020-09-11 NOTE — Progress Notes (Signed)
BP (!) 155/98   Pulse (!) 103   Temp 98.7 F (37.1 C) (Oral)   Ht 5' 9.49" (1.765 m)   Wt 282 lb 6.4 oz (128.1 kg)   SpO2 96%   BMI 41.12 kg/m    Subjective:    Patient ID: Travis Diaz, male    DOB: November 14, 1955, 65 y.o.   MRN: [id number]  Chief Complaint  Patient presents with  . Hypertension  . Atrial Fibrillation  . Gout    HPI: Travis Diaz is a 65 y.o. male  Pt is here to establish care.   Hypertension This is a chronic problem. The current episode started more than 1 year ago. The problem is controlled. Pertinent negatives include no anxiety, blurred vision, chest pain, headaches, malaise/fatigue, neck pain, orthopnea, palpitations, peripheral edema, PND or shortness of breath.  Arthritis Presents for follow-up (is on allopurinol for gout) visit. He reports no pain, stiffness, joint swelling or joint warmth.   Chief Complaint  Patient presents with  . Hypertension  . Atrial Fibrillation  . Gout    Relevant past medical, surgical, family and social history reviewed and updated as indicated. Interim medical history since our last visit reviewed. Allergies and medications reviewed and updated.  Review of Systems  Constitutional:  Negative for malaise/fatigue.  Eyes:  Negative for blurred vision.  Respiratory:  Negative for shortness of breath.   Cardiovascular:  Negative for chest pain, palpitations, orthopnea and PND.  Musculoskeletal:  Positive for arthritis. Negative for joint swelling, neck pain and stiffness.  Neurological:  Negative for headaches.   Per HPI unless specifically indicated above     Objective:    BP (!) 155/98   Pulse (!) 103   Temp 98.7 F (37.1 C) (Oral)   Ht 5' 9.49" (1.765 m)   Wt 282 lb 6.4 oz (128.1 kg)   SpO2 96%   BMI 41.12 kg/m   Wt Readings from Last 3 Encounters:  09/11/20 282 lb 6.4 oz (128.1 kg)  02/28/20 285 lb (129.3 kg)  02/11/20 286 lb 12.8 oz (130.1 kg)    Physical Exam Vitals and nursing note  reviewed.  Constitutional:      General: He is not in acute distress.    Appearance: Normal appearance. He is not ill-appearing or diaphoretic.  HENT:     Head: Normocephalic and atraumatic.     Right Ear: Tympanic membrane and external ear normal. There is no impacted cerumen.     Left Ear: External ear normal.     Nose: No congestion or rhinorrhea.     Mouth/Throat:     Pharynx: No oropharyngeal exudate or posterior oropharyngeal erythema.  Eyes:     Conjunctiva/sclera: Conjunctivae normal.     Pupils: Pupils are equal, round, and reactive to light.  Cardiovascular:     Rate and Rhythm: Normal rate and regular rhythm.     Heart sounds: No murmur heard.   No friction rub. No gallop.  Pulmonary:     Effort: No respiratory distress.     Breath sounds: No stridor. No wheezing or rhonchi.  Chest:     Chest wall: No tenderness.  Abdominal:     General: Abdomen is flat. Bowel sounds are normal.     Palpations: Abdomen is soft. There is no mass.     Tenderness: There is no abdominal tenderness.  Musculoskeletal:     Cervical back: Normal range of motion and neck supple. No rigidity or tenderness.  Left lower leg: No edema.  Skin:    General: Skin is warm and dry.  Neurological:     Mental Status: He is alert.   Results for orders placed or performed in visit on 14/78/29  Basic Metabolic Panel (BMET)  Result Value Ref Range   Glucose 93 65 - 99 mg/dL   BUN 13 8 - 27 mg/dL   Creatinine, Ser 1.22 0.76 - 1.27 mg/dL   GFR calc non Af Amer 62 >59 mL/min/1.73   GFR calc Af Amer 71 >59 mL/min/1.73   BUN/Creatinine Ratio 11 10 - 24   Sodium 135 134 - 144 mmol/L   Potassium 5.5 (H) 3.5 - 5.2 mmol/L   Chloride 97 96 - 106 mmol/L   CO2 23 20 - 29 mmol/L   Calcium 9.3 8.6 - 10.2 mg/dL  Uric acid  Result Value Ref Range   Uric Acid 4.5 3.8 - 8.4 mg/dL        Current Outpatient Medications:  .  allopurinol (ZYLOPRIM) 300 MG tablet, Take 1 tablet (300 mg total) by mouth daily.  Needs appt for further refills, Disp: 30 tablet, Rfl: 0 .  amLODipine (NORVASC) 10 MG tablet, Take 1 tablet (10 mg total) by mouth daily. Needs appt for further refills, Disp: 30 tablet, Rfl: 0 .  Flaxseed, Linseed, (FLAXSEED OIL) 1000 MG CAPS, Take 1,000 mg by mouth daily. , Disp: , Rfl:  .  meloxicam (MOBIC) 15 MG tablet, Take 1 tablet (15 mg total) by mouth daily., Disp: 30 tablet, Rfl: 0    Assessment & Plan:  Gout is on allopurinol for such  not acutely inflammed  -  is on allopurinol will check URIC acid levels today - Consider uloric for prevention of such   2. HTN is on norvasc for such BP HIGH WAS ON BENAZAPRIL for such  Continue current meds.  Medication compliance emphasised. pt advised to keep Bp logs. Pt verbalised understanding of the same. Pt to have a low salt diet . Exercise to reach a goal of at least 150 mins a week.  lifestyle modifications explained and pt understands importance of the above.  3. Hld TG high at 199  Consider fenofibrate   4. Lower ext rash / celliulitis will start pt on keflex   Problem List Items Addressed This Visit   None   Orders Placed This Encounter  Procedures  . PSA Total+%Free (Serial)  . Comprehensive metabolic panel  . CBC with Differential/Platelet  . Bayer DCA Hb A1c Waived  . Lipid panel  . Uric acid     Meds ordered this encounter  Medications  . benazepril (LOTENSIN) 5 MG tablet    Sig: Take 1 tablet (5 mg total) by mouth daily.    Dispense:  30 tablet    Refill:  5  . cephALEXin (KEFLEX) 500 MG capsule    Sig: Take 1 capsule (500 mg total) by mouth 2 (two) times daily for 7 days.    Dispense:  14 capsule    Refill:  0     Follow up plan: No follow-ups on file.

## 2020-09-12 ENCOUNTER — Encounter: Payer: Self-pay | Admitting: Internal Medicine

## 2020-10-02 ENCOUNTER — Other Ambulatory Visit: Payer: Self-pay

## 2020-10-02 ENCOUNTER — Other Ambulatory Visit: Payer: Medicare Other

## 2020-10-02 DIAGNOSIS — M1000 Idiopathic gout, unspecified site: Secondary | ICD-10-CM

## 2020-10-02 DIAGNOSIS — Z125 Encounter for screening for malignant neoplasm of prostate: Secondary | ICD-10-CM

## 2020-10-02 DIAGNOSIS — I482 Chronic atrial fibrillation, unspecified: Secondary | ICD-10-CM

## 2020-10-02 DIAGNOSIS — I10 Essential (primary) hypertension: Secondary | ICD-10-CM

## 2020-10-02 LAB — BAYER DCA HB A1C WAIVED: HB A1C (BAYER DCA - WAIVED): 5.3 % (ref <7.0)

## 2020-10-03 ENCOUNTER — Other Ambulatory Visit: Payer: Self-pay | Admitting: Internal Medicine

## 2020-10-03 DIAGNOSIS — M1000 Idiopathic gout, unspecified site: Secondary | ICD-10-CM

## 2020-10-03 LAB — CBC WITH DIFFERENTIAL/PLATELET
Basophils Absolute: 0.1 10*3/uL (ref 0.0–0.2)
Basos: 1 %
EOS (ABSOLUTE): 0.4 10*3/uL (ref 0.0–0.4)
Eos: 5 %
Hematocrit: 50.9 % (ref 37.5–51.0)
Hemoglobin: 17 g/dL (ref 13.0–17.7)
Immature Grans (Abs): 0.1 10*3/uL (ref 0.0–0.1)
Immature Granulocytes: 1 %
Lymphocytes Absolute: 1.2 10*3/uL (ref 0.7–3.1)
Lymphs: 15 %
MCH: 30.4 pg (ref 26.6–33.0)
MCHC: 33.4 g/dL (ref 31.5–35.7)
MCV: 91 fL (ref 79–97)
Monocytes Absolute: 0.7 10*3/uL (ref 0.1–0.9)
Monocytes: 9 %
Neutrophils Absolute: 5.8 10*3/uL (ref 1.4–7.0)
Neutrophils: 69 %
Platelets: 257 10*3/uL (ref 150–450)
RBC: 5.59 x10E6/uL (ref 4.14–5.80)
RDW: 17.5 % — ABNORMAL HIGH (ref 11.6–15.4)
WBC: 8.3 10*3/uL (ref 3.4–10.8)

## 2020-10-03 LAB — COMPREHENSIVE METABOLIC PANEL
ALT: 32 IU/L (ref 0–44)
AST: 21 IU/L (ref 0–40)
Albumin/Globulin Ratio: 1.8 (ref 1.2–2.2)
Albumin: 4.5 g/dL (ref 3.8–4.8)
Alkaline Phosphatase: 72 IU/L (ref 44–121)
BUN/Creatinine Ratio: 11 (ref 10–24)
BUN: 11 mg/dL (ref 8–27)
Bilirubin Total: 0.8 mg/dL (ref 0.0–1.2)
CO2: 26 mmol/L (ref 20–29)
Calcium: 9.2 mg/dL (ref 8.6–10.2)
Chloride: 95 mmol/L — ABNORMAL LOW (ref 96–106)
Creatinine, Ser: 0.98 mg/dL (ref 0.76–1.27)
Globulin, Total: 2.5 g/dL (ref 1.5–4.5)
Glucose: 89 mg/dL (ref 65–99)
Potassium: 5.1 mmol/L (ref 3.5–5.2)
Sodium: 134 mmol/L (ref 134–144)
Total Protein: 7 g/dL (ref 6.0–8.5)
eGFR: 86 mL/min/{1.73_m2} (ref >59)

## 2020-10-03 LAB — PSA TOTAL+% FREE (SERIAL)
PSA, Free Pct: 33.6 %
PSA, Free: 0.37 ng/mL
Prostate Specific Ag, Serum: 1.1 ng/mL (ref 0.0–4.0)

## 2020-10-03 LAB — LIPID PANEL
Chol/HDL Ratio: 4 ratio (ref 0.0–5.0)
Cholesterol, Total: 218 mg/dL — ABNORMAL HIGH (ref 100–199)
HDL: 54 mg/dL (ref >39)
LDL Chol Calc (NIH): 130 mg/dL — ABNORMAL HIGH (ref 0–99)
Triglycerides: 194 mg/dL — ABNORMAL HIGH (ref 0–149)
VLDL Cholesterol Cal: 34 mg/dL (ref 5–40)

## 2020-10-03 LAB — URIC ACID: Uric Acid: 4.1 mg/dL (ref 3.8–8.4)

## 2020-10-03 NOTE — Telephone Encounter (Signed)
   Notes to clinic:  Patient has appt on 10/09/2020 Review for another refill    Requested Prescriptions  Pending Prescriptions Disp Refills   allopurinol (ZYLOPRIM) 300 MG tablet [Pharmacy Med Name: Allopurinol 300 MG Oral Tablet] 30 tablet 0    Sig: TAKE 1 TABLET BY MOUTH ONCE DAILY . APPOINTMENT REQUIRED FOR FUTURE REFILLS      Endocrinology:  Gout Agents Passed - 10/03/2020 12:26 PM      Passed - Uric Acid in normal range and within 360 days    Uric Acid  Date Value Ref Range Status  10/02/2020 4.1 3.8 - 8.4 mg/dL Final    Comment:               Therapeutic target for gout patients: <6.0          Passed - Cr in normal range and within 360 days    Creatinine  Date Value Ref Range Status  04/12/2014 0.99 0.60 - 1.30 mg/dL Final   Creatinine, Ser  Date Value Ref Range Status  10/02/2020 0.98 0.76 - 1.27 mg/dL Final          Passed - Valid encounter within last 12 months    Recent Outpatient Visits           3 weeks ago Screening PSA (prostate specific antigen)   [facility] Vigg, Avanti, MD   7 months ago Essential hypertension   [place], Jessica A, NP   7 months ago [facility] [employer] preventive visit   [facility] Eulogio Bear, NP   1 year ago Essential hypertension   [place], [place], [place]   1 year ago Acute idiopathic gout, unspecified site   [facility], Lilia Argue, [place]       Future Appointments             In 6 days Vigg, Avanti, MD [facility], [facility]

## 2020-10-06 NOTE — Telephone Encounter (Signed)
Pt is scheduled 7/14

## 2020-10-09 ENCOUNTER — Other Ambulatory Visit: Payer: Self-pay

## 2020-10-09 ENCOUNTER — Ambulatory Visit (INDEPENDENT_AMBULATORY_CARE_PROVIDER_SITE_OTHER): Payer: Medicare Other | Admitting: Internal Medicine

## 2020-10-09 VITALS — BP 136/84 | HR 82 | Temp 99.2°F | Ht 69.49 in | Wt 281.2 lb

## 2020-10-09 DIAGNOSIS — Z136 Encounter for screening for cardiovascular disorders: Secondary | ICD-10-CM

## 2020-10-09 DIAGNOSIS — M170 Bilateral primary osteoarthritis of knee: Secondary | ICD-10-CM | POA: Diagnosis not present

## 2020-10-09 DIAGNOSIS — M1000 Idiopathic gout, unspecified site: Secondary | ICD-10-CM | POA: Diagnosis not present

## 2020-10-09 DIAGNOSIS — I10 Essential (primary) hypertension: Secondary | ICD-10-CM | POA: Diagnosis not present

## 2020-10-09 DIAGNOSIS — R600 Localized edema: Secondary | ICD-10-CM

## 2020-10-09 DIAGNOSIS — I872 Venous insufficiency (chronic) (peripheral): Secondary | ICD-10-CM

## 2020-10-09 DIAGNOSIS — F1010 Alcohol abuse, uncomplicated: Secondary | ICD-10-CM

## 2020-10-09 MED ORDER — MELOXICAM 15 MG PO TABS: 15.0000 mg | ORAL_TABLET | Freq: Every day | ORAL | 0 refills | Status: DC

## 2020-10-09 MED ORDER — ALLOPURINOL 300 MG PO TABS: 300.0000 mg | ORAL_TABLET | Freq: Every day | ORAL | 0 refills | Status: DC

## 2020-10-09 MED ORDER — BENAZEPRIL HCL 20 MG PO TABS: 20.0000 mg | ORAL_TABLET | Freq: Every day | ORAL | 2 refills | Status: DC

## 2020-10-09 MED ORDER — AMLODIPINE BESYLATE 10 MG PO TABS: 10.0000 mg | ORAL_TABLET | Freq: Every day | ORAL | 0 refills | Status: DC

## 2020-10-09 MED ORDER — ALLOPURINOL 300 MG PO TABS: 300.0000 mg | ORAL_TABLET | Freq: Every day | ORAL | 2 refills | Status: DC

## 2020-10-09 NOTE — Progress Notes (Signed)
BP 136/84   Pulse 82   Temp 99.2 F (37.3 C) (Oral)   Ht 5' 9.49" (1.765 m)   Wt 281 lb 3.2 oz (127.6 kg)   SpO2 99%   BMI 40.94 kg/m    Subjective:    Patient ID: Travis Diaz, male    DOB: 1955/08/25, 65 y.o.   MRN: [id number]  Chief Complaint  Patient presents with   Atrial Fibrillation   Gout   Lab review    HPI: Travis Diaz is a 65 y.o. male  Pt I shere for follow-up.  He claims that his lower extremity rash is a little bit better however the leaking of fluid from the lower extremity resolved.  Status post Keflex.  Patient does have a history of alcohol abuse and drinks 15 cans of beer about 5 times a week for the last 2 to 3 years initially was about 3 times a week however gone up now  Atrial Fibrillation Presents for follow-up visit. Symptoms include hypertension. Symptoms are negative for chest pain. Past medical history includes atrial fibrillation and hyperlipidemia.  Alcohol Problem Primary symptoms comment: pt drinks 15 cans of beer x 5 times a week.  Hyperlipidemia This is a chronic problem. The problem is uncontrolled. He has no history of chronic renal disease. Pertinent negatives include no chest pain. Current antihyperlipidemic treatment includes exercise, diet change and statins. Compliance problems include adherence to diet and adherence to exercise.  Risk factors for coronary artery disease include a sedentary lifestyle.  Arthritis Presents for follow-up (Has a history of gout) visit. He reports no stiffness or joint warmth. (pt drinks 15 cans of beer x 5 times a week) The symptoms have been stable. Associated symptoms include rash. (Is on allopurinol for such)  Rash  Hypertension This is a chronic problem. The current episode started more than 1 month ago. The problem has been waxing and waning since onset. The problem is controlled. Pertinent negatives include no anxiety, blurred vision, chest pain, malaise/fatigue, neck pain or orthopnea.  There is no history of chronic renal disease.   Chief Complaint  Patient presents with   Atrial Fibrillation   Gout   Lab review    Relevant past medical, surgical, family and social history reviewed and updated as indicated. Interim medical history since our last visit reviewed. Allergies and medications reviewed and updated.  Review of Systems  Constitutional:  Negative for malaise/fatigue.  Eyes:  Negative for blurred vision.  Cardiovascular:  Negative for chest pain and orthopnea.  Musculoskeletal:  Positive for arthritis. Negative for neck pain and stiffness.  Skin:  Positive for rash.   Per HPI unless specifically indicated above     Objective:    BP 136/84   Pulse 82   Temp 99.2 F (37.3 C) (Oral)   Ht 5' 9.49" (1.765 m)   Wt 281 lb 3.2 oz (127.6 kg)   SpO2 99%   BMI 40.94 kg/m   Wt Readings from Last 3 Encounters:  10/09/20 281 lb 3.2 oz (127.6 kg)  09/11/20 282 lb 6.4 oz (128.1 kg)  02/28/20 285 lb (129.3 kg)    Physical Exam Vitals and nursing note reviewed.  Constitutional:      General: He is not in acute distress.    Appearance: Normal appearance. He is not ill-appearing or diaphoretic.  HENT:     Head: Normocephalic and atraumatic.     Right Ear: Tympanic membrane and external ear normal. There is no impacted cerumen.  Left Ear: External ear normal.     Nose: No congestion or rhinorrhea.     Mouth/Throat:     Pharynx: No oropharyngeal exudate or posterior oropharyngeal erythema.  Eyes:     Conjunctiva/sclera: Conjunctivae normal.     Pupils: Pupils are equal, round, and reactive to light.  Cardiovascular:     Rate and Rhythm: Normal rate and regular rhythm.     Heart sounds: No murmur heard.   No friction rub. No gallop.  Pulmonary:     Effort: No respiratory distress.     Breath sounds: No stridor. No wheezing or rhonchi.  Chest:     Chest wall: No tenderness.  Abdominal:     General: Abdomen is flat. Bowel sounds are normal.      Palpations: Abdomen is soft. There is no mass.     Tenderness: There is no abdominal tenderness.  Musculoskeletal:        General: Swelling present.     Cervical back: Normal range of motion and neck supple. No rigidity or tenderness.     Left lower leg: No edema.  Skin:    General: Skin is warm and dry.     Findings: Rash present.  Neurological:     Mental Status: He is alert.    Results for orders placed or performed in visit on 10/02/20  Lipid panel  Result Value Ref Range   Cholesterol, Total 218 (H) 100 - 199 mg/dL   Triglycerides 194 (H) 0 - 149 mg/dL   HDL 54 >39 mg/dL   VLDL Cholesterol Cal 34 5 - 40 mg/dL   LDL Chol Calc (NIH) 130 (H) 0 - 99 mg/dL   Chol/HDL Ratio 4.0 0.0 - 5.0 ratio  Bayer DCA Hb A1c Waived  Result Value Ref Range   HB A1C (BAYER DCA - WAIVED) 5.3 <7.0 %  CBC with Differential/Platelet  Result Value Ref Range   WBC 8.3 3.4 - 10.8 x10E3/uL   RBC 5.59 4.14 - 5.80 x10E6/uL   Hemoglobin 17.0 13.0 - 17.7 g/dL   Hematocrit 50.9 37.5 - 51.0 %   MCV 91 79 - 97 fL   MCH 30.4 26.6 - 33.0 pg   MCHC 33.4 31.5 - 35.7 g/dL   RDW 17.5 (H) 11.6 - 15.4 %   Platelets 257 150 - 450 x10E3/uL   Neutrophils 69 Not Estab. %   Lymphs 15 Not Estab. %   Monocytes 9 Not Estab. %   Eos 5 Not Estab. %   Basos 1 Not Estab. %   Neutrophils Absolute 5.8 1.4 - 7.0 x10E3/uL   Lymphocytes Absolute 1.2 0.7 - 3.1 x10E3/uL   Monocytes Absolute 0.7 0.1 - 0.9 x10E3/uL   EOS (ABSOLUTE) 0.4 0.0 - 0.4 x10E3/uL   Basophils Absolute 0.1 0.0 - 0.2 x10E3/uL   Immature Granulocytes 1 Not Estab. %   Immature Grans (Abs) 0.1 0.0 - 0.1 x10E3/uL  Comprehensive metabolic panel  Result Value Ref Range   Glucose 89 65 - 99 mg/dL   BUN 11 8 - 27 mg/dL   Creatinine, Ser 0.98 0.76 - 1.27 mg/dL   eGFR 86 >59 mL/min/1.73   BUN/Creatinine Ratio 11 10 - 24   Sodium 134 134 - 144 mmol/L   Potassium 5.1 3.5 - 5.2 mmol/L   Chloride 95 (L) 96 - 106 mmol/L   CO2 26 20 - 29 mmol/L   Calcium 9.2 8.6  - 10.2 mg/dL   Total Protein 7.0 6.0 - 8.5 g/dL   Albumin  4.5 3.8 - 4.8 g/dL   Globulin, Total 2.5 1.5 - 4.5 g/dL   Albumin/Globulin Ratio 1.8 1.2 - 2.2   Bilirubin Total 0.8 0.0 - 1.2 mg/dL   Alkaline Phosphatase 72 44 - 121 IU/L   AST 21 0 - 40 IU/L   ALT 32 0 - 44 IU/L  PSA Total+%Free (Serial)  Result Value Ref Range   Prostate Specific Ag, Serum 1.1 0.0 - 4.0 ng/mL   PSA, Free 0.37 N/A ng/mL   PSA, Free Pct 33.6 %  Uric acid  Result Value Ref Range   Uric Acid 4.1 3.8 - 8.4 mg/dL        Current Outpatient Medications:    benazepril (LOTENSIN) 5 MG tablet, Take 1 tablet (5 mg total) by mouth daily., Disp: 30 tablet, Rfl: 5   Flaxseed, Linseed, (FLAXSEED OIL) 1000 MG CAPS, Take 1,000 mg by mouth daily. , Disp: , Rfl:    allopurinol (ZYLOPRIM) 300 MG tablet, Take 1 tablet (300 mg total) by mouth daily. Needs appt for further refills, Disp: 30 tablet, Rfl: 0   amLODipine (NORVASC) 10 MG tablet, Take 1 tablet (10 mg total) by mouth daily. Needs appt for further refills, Disp: 30 tablet, Rfl: 0   meloxicam (MOBIC) 15 MG tablet, Take 1 tablet (15 mg total) by mouth daily., Disp: 30 tablet, Rfl: 0    Assessment & Plan:  HLD : stable.  Not on meds for such   Ref. Range 02/11/2020 13:09 02/28/2020 11:44 10/02/2020 08:04 10/02/2020 08:08  Cholesterol, Total Latest Ref Range: 100 - 199 mg/dL 214 (H)   218 (H)  HDL Cholesterol Latest Ref Range: >39 mg/dL 50   54  Triglycerides Latest Ref Range: 0 - 149 mg/dL 199 (H)   194 (H)  VLDL Cholesterol Cal Latest Ref Range: 5 - 40 mg/dL 35   34  LDL Chol Calc (NIH) Latest Ref Range: 0 - 99 mg/dL 129 (H)   130 (H)    Gout is on allopurinol for such  not acutely inflammed -  is on allopurinol will check URIC acid levels today - Consider uloric for prevention of such    2. HTN is on norvasc for such BP HIGH WAS ON BENAZAPRIL for such  Continue current meds.  Medication compliance emphasised. pt advised to keep Bp logs. Pt verbalised  understanding of the same. Pt to have a low salt diet . Exercise to reach a goal of at least 150 mins a week.  lifestyle modifications explained and pt understands importance of the above.   3. Hld TG high at 199 Consider fenofibrate recheck FLP x 3 months  work on diet. low fat and high fiber diet explained to pt.  4. Lower ext rash / celliulitis s/p  keflex - not leaking better per pt.   5. Alcohol abuse :  Drinks 5 times a week x 15 cans x almost 2 yrs since COVID  3 days a week before then. Was 217 -- 280   6. Pedal edema : ? Sec to norvasc stop this.  Consider ECHO sec to alcohol abuse.    Problem List Items Addressed This Visit       Musculoskeletal and Integument   Arthritis of both knees   Relevant Medications   allopurinol (ZYLOPRIM) 300 MG tablet   meloxicam (MOBIC) 15 MG tablet     Other   Acute gout   Relevant Medications   allopurinol (ZYLOPRIM) 300 MG tablet   Other Visit Diagnoses  Essential hypertension       Relevant Medications   amLODipine (NORVASC) 10 MG tablet        No orders of the defined types were placed in this encounter.    Meds ordered this encounter  Medications   allopurinol (ZYLOPRIM) 300 MG tablet    Sig: Take 1 tablet (300 mg total) by mouth daily. Needs appt for further refills    Dispense:  30 tablet    Refill:  0   amLODipine (NORVASC) 10 MG tablet    Sig: Take 1 tablet (10 mg total) by mouth daily. Needs appt for further refills    Dispense:  30 tablet    Refill:  0   meloxicam (MOBIC) 15 MG tablet    Sig: Take 1 tablet (15 mg total) by mouth daily.    Dispense:  30 tablet    Refill:  0     Follow up plan: No follow-ups on file.

## 2020-10-09 NOTE — Patient Instructions (Signed)
Dyslipidemia Dyslipidemia is an imbalance of waxy, fat-like substances (lipids) in the blood. The body needs lipids in small amounts. Dyslipidemia often involves a high level of cholesterol or triglycerides, which are types oflipids. Common forms of dyslipidemia include: High levels of LDL cholesterol. LDL is the type of cholesterol that causes fatty deposits (plaques) to build up in the blood vessels that carry blood away from your heart (arteries). Low levels of HDL cholesterol. HDL cholesterol is the type of cholesterol that protects against heart disease. High levels of HDL remove the LDL buildup from arteries. High levels of triglycerides. Triglycerides are a fatty substance in the blood that is linked to a buildup of plaques in the arteries. What are the causes? Primary dyslipidemia is caused by changes (mutations) in genes that are passed down through families (inherited). These mutations cause several types of dyslipidemia. Secondary dyslipidemia is caused by lifestyle choices and diseases that lead to dyslipidemia, such as: Eating a diet that is high in animal fat. Not getting enough exercise. Having diabetes, kidney disease, liver disease, or thyroid disease. Drinking large amounts of alcohol. Using certain medicines. What increases the risk? You are more likely to develop this condition if you are an older man or if you are a woman who has gone through menopause. Other risk factors include: Having a family history of dyslipidemia. Taking certain medicines, including birth control pills, steroids, some diuretics, and beta-blockers. Smoking cigarettes. Eating a high-fat diet. Having certain medical conditions such as diabetes, polycystic ovary syndrome (PCOS), kidney disease, liver disease, or hypothyroidism. Not exercising regularly. Being overweight or obese with too much belly fat. What are the signs or symptoms? In most cases, dyslipidemia does not usually cause any symptoms. In  severe cases, very high lipid levels can cause: Fatty bumps under the skin (xanthomas). White or gray ring around the black center (pupil) of the eye. Very high triglyceride levels can cause inflammation of the pancreas (pancreatitis). How is this diagnosed? Your health care provider may diagnose dyslipidemia based on a routine blood test (fasting blood test). Because most people do not have symptoms of the condition, this blood testing (lipid profile) is done on adults age 82 and older and is repeated every 5 years. This test checks: Total cholesterol. This measures the total amount of cholesterol in your blood, including LDL cholesterol, HDL cholesterol, and triglycerides. A healthy number is below 200. LDL cholesterol. The target number for LDL cholesterol is different for each person, depending on individual risk factors. Ask your health care provider what your LDL cholesterol should be. HDL cholesterol. An HDL level of 60 or higher is best because it helps to protect against heart disease. A number below 73 for men or below 48 for women increases the risk for heart disease. Triglycerides. A healthy triglyceride number is below 150. If your lipid profile is abnormal, your health care provider may do other bloodtests. How is this treated? Treatment depends on the type of dyslipidemia that you have and your other risk factors for heart disease and stroke. Your health care provider will have atarget range for your lipid levels based on this information. For many people, this condition may be treated by lifestyle changes, such as diet and exercise. Your health care provider may recommend that you: Get regular exercise. Make changes to your diet. Quit smoking if you smoke. If diet changes and exercise do not help you reach your goals, your health care provider may also prescribe medicine to lower lipids. The most commonly  prescribed type of medicine lowers your LDL cholesterol (statin drug). If you  have a high triglyceride level, your provider may prescribe another type of drug (fibrate) or an omega-3 fish oil supplement, or both. Follow these instructions at home:  Eating and drinking Follow instructions from your health care provider or dietitian about eating or drinking restrictions. Eat a healthy diet as told by your health care provider. This can help you reach and maintain a healthy weight, lower your LDL cholesterol, and raise your HDL cholesterol. This may include: Limiting your calories, if you are overweight. Eating more fruits, vegetables, whole grains, fish, and lean meats. Limiting saturated fat, trans fat, and cholesterol. If you drink alcohol: Limit how much you use. Be aware of how much alcohol is in your drink. In the [place] one drink equals one 12 oz bottle of beer (355 mL), one 5 oz glass of wine (148 mL), or one 1 oz glass of hard liquor (44 mL). Do not drink alcohol if: Your health care provider tells you not to drink. You are pregnant, may be pregnant, or are planning to become pregnant. Activity Get regular exercise. Start an exercise and strength training program as told by your health care provider. Ask your health care provider what activities are safe for you. Your health care provider may recommend: 30 minutes of aerobic activity 4-6 days a week. Brisk walking is an example of aerobic activity. Strength training 2 days a week. General instructions Do not use any products that contain nicotine or tobacco, such as cigarettes, e-cigarettes, and chewing tobacco. If you need help quitting, ask your health care provider. Take over-the-counter and prescription medicines only as told by your health care provider. This includes supplements. Keep all follow-up visits as told by your health care provider. Contact a health care provider if: You are: Having trouble sticking to your exercise or diet plan. Struggling to quit smoking or control your use of  alcohol. Summary Dyslipidemia often involves a high level of cholesterol or triglycerides, which are types of lipids. Treatment depends on the type of dyslipidemia that you have and your other risk factors for heart disease and stroke. For many people, treatment starts with lifestyle changes, such as diet and exercise. Your health care provider may prescribe medicine to lower lipids. This information is not intended to replace advice given to you by your health care provider. Make sure you discuss any questions you have with your healthcare provider. Document Revised: 11/07/2017 Document Reviewed: 10/14/2017 Elsevier Patient Education  [place].

## 2020-10-10 ENCOUNTER — Encounter: Payer: Self-pay | Admitting: Internal Medicine

## 2020-12-31 ENCOUNTER — Other Ambulatory Visit: Payer: Self-pay

## 2020-12-31 ENCOUNTER — Other Ambulatory Visit: Payer: Medicare Other

## 2020-12-31 DIAGNOSIS — I10 Essential (primary) hypertension: Secondary | ICD-10-CM

## 2020-12-31 DIAGNOSIS — Z136 Encounter for screening for cardiovascular disorders: Secondary | ICD-10-CM

## 2021-01-01 LAB — LIPID PANEL
Chol/HDL Ratio: 4.3 ratio (ref 0.0–5.0)
Cholesterol, Total: 204 mg/dL — ABNORMAL HIGH (ref 100–199)
HDL: 48 mg/dL (ref >39)
LDL Chol Calc (NIH): 130 mg/dL — ABNORMAL HIGH (ref 0–99)
Triglycerides: 148 mg/dL (ref 0–149)
VLDL Cholesterol Cal: 26 mg/dL (ref 5–40)

## 2021-01-01 LAB — CMP14+EGFR
ALT: 33 IU/L (ref 0–44)
AST: 27 IU/L (ref 0–40)
Albumin/Globulin Ratio: 1.6 (ref 1.2–2.2)
Albumin: 4.1 g/dL (ref 3.8–4.8)
Alkaline Phosphatase: 68 IU/L (ref 44–121)
BUN/Creatinine Ratio: 13 (ref 10–24)
BUN: 14 mg/dL (ref 8–27)
Bilirubin Total: 0.6 mg/dL (ref 0.0–1.2)
CO2: 25 mmol/L (ref 20–29)
Calcium: 9.3 mg/dL (ref 8.6–10.2)
Chloride: 98 mmol/L (ref 96–106)
Creatinine, Ser: 1.06 mg/dL (ref 0.76–1.27)
Globulin, Total: 2.5 g/dL (ref 1.5–4.5)
Glucose: 92 mg/dL (ref 70–99)
Potassium: 4.9 mmol/L (ref 3.5–5.2)
Sodium: 139 mmol/L (ref 134–144)
Total Protein: 6.6 g/dL (ref 6.0–8.5)
eGFR: 77 mL/min/{1.73_m2} (ref >59)

## 2021-01-08 ENCOUNTER — Other Ambulatory Visit: Payer: Self-pay

## 2021-01-08 ENCOUNTER — Encounter: Payer: Self-pay | Admitting: Internal Medicine

## 2021-01-08 ENCOUNTER — Ambulatory Visit (INDEPENDENT_AMBULATORY_CARE_PROVIDER_SITE_OTHER): Payer: Medicare Other | Admitting: Internal Medicine

## 2021-01-08 VITALS — BP 170/90 | HR 99 | Temp 98.1°F | Wt 274.2 lb

## 2021-01-08 DIAGNOSIS — M1000 Idiopathic gout, unspecified site: Secondary | ICD-10-CM | POA: Diagnosis not present

## 2021-01-08 DIAGNOSIS — R000 Tachycardia, unspecified: Secondary | ICD-10-CM | POA: Diagnosis not present

## 2021-01-08 DIAGNOSIS — I10 Essential (primary) hypertension: Secondary | ICD-10-CM | POA: Diagnosis not present

## 2021-01-08 DIAGNOSIS — I4891 Unspecified atrial fibrillation: Secondary | ICD-10-CM

## 2021-01-08 DIAGNOSIS — Z6839 Body mass index (BMI) 39.0-39.9, adult: Secondary | ICD-10-CM

## 2021-01-08 MED ORDER — BENAZEPRIL HCL 40 MG PO TABS: 40.0000 mg | ORAL_TABLET | Freq: Every day | ORAL | 2 refills | Status: DC

## 2021-01-08 MED ORDER — ASPIRIN 81 MG PO TBEC: 81.0000 mg | DELAYED_RELEASE_TABLET | Freq: Every day | ORAL | 12 refills | Status: DC

## 2021-01-08 MED ORDER — ALLOPURINOL 300 MG PO TABS: 300.0000 mg | ORAL_TABLET | Freq: Every day | ORAL | 2 refills | Status: DC

## 2021-01-08 NOTE — Progress Notes (Signed)
BP (!) 170/90   Pulse 99   Temp 98.1 F (36.7 C) (Oral)   Wt 274 lb 3.2 oz (124.4 kg)   SpO2 97%   BMI 39.93 kg/m    Subjective:    Patient ID: Travis Diaz, male    DOB: 02-19-1956, 65 y.o.   MRN: [id number]  Chief Complaint  Patient presents with  . Hyperlipidemia    Patient states he is here to follow up on his Cholesterol and states he has cut back on red meat and dairy. Patient denies having any concerns at today's visit.     HPI: Travis Diaz is a 66 y.o. male  Vitals with BMI              01/08/2021               10/09/2020             10/09/2020                 Weight                       274 lbs 3 oz                                   281 lbs 3 oz              Dropped - 9 lbs since last visit, done to gallon.  Body mass index is 39.93 kg/m.  ETOH drinks - hard liquor about 1 gallon very 2 weeks - cut beer out   Hyperlipidemia This is a chronic problem. The problem is controlled. Pertinent negatives include no chest pain or shortness of breath.  Hypertension This is a chronic problem. The current episode started in the past 7 days. The problem is uncontrolled. Pertinent negatives include no anxiety, blurred vision, chest pain, headaches, malaise/fatigue, neck pain, orthopnea, palpitations, peripheral edema, PND, shortness of breath or sweats.   Chief Complaint  Patient presents with  . Hyperlipidemia    Patient states he is here to follow up on his Cholesterol and states he has cut back on red meat and dairy. Patient denies having any concerns at today's visit.     Relevant past medical, surgical, family and social history reviewed and updated as indicated. Interim medical history since our last visit reviewed. Allergies and medications reviewed and updated.  Review of Systems  Constitutional:  Negative for malaise/fatigue.  Eyes:  Negative for blurred vision.  Respiratory:  Negative for shortness of breath.   Cardiovascular:  Negative for chest  pain, palpitations, orthopnea and PND.  Musculoskeletal:  Negative for neck pain.  Neurological:  Negative for headaches.   Per HPI unless specifically indicated above     Objective:    BP (!) 170/90   Pulse 99   Temp 98.1 F (36.7 C) (Oral)   Wt 274 lb 3.2 oz (124.4 kg)   SpO2 97%   BMI 39.93 kg/m   Wt Readings from Last 3 Encounters:  01/08/21 274 lb 3.2 oz (124.4 kg)  10/09/20 281 lb 3.2 oz (127.6 kg)  09/11/20 282 lb 6.4 oz (128.1 kg)    Physical Exam Vitals and nursing note reviewed.  Constitutional:      General: He is not in acute distress.    Appearance: Normal appearance. He is not ill-appearing or diaphoretic.  HENT:  Head: Normocephalic and atraumatic.     Right Ear: Tympanic membrane and external ear normal. There is no impacted cerumen.     Left Ear: External ear normal.     Nose: No congestion or rhinorrhea.     Mouth/Throat:     Pharynx: No oropharyngeal exudate or posterior oropharyngeal erythema.  Eyes:     Conjunctiva/sclera: Conjunctivae normal.     Pupils: Pupils are equal, round, and reactive to light.  Cardiovascular:     Rate and Rhythm: Tachycardia present. Rhythm irregular.     Heart sounds: No murmur heard.   No friction rub. No gallop.  Pulmonary:     Effort: No respiratory distress.     Breath sounds: No stridor. No wheezing or rhonchi.  Chest:     Chest wall: No tenderness.  Abdominal:     General: Abdomen is flat. Bowel sounds are normal.     Palpations: Abdomen is soft. There is no mass.     Tenderness: There is no abdominal tenderness.  Musculoskeletal:     Cervical back: Normal range of motion and neck supple. No rigidity or tenderness.     Left lower leg: No edema.  Skin:    General: Skin is warm and dry.     Coloration: Skin is not jaundiced or pale.  Neurological:     Mental Status: He is alert.   Results for orders placed or performed in visit on 12/31/20  Lipid panel  Result Value Ref Range   Cholesterol, Total 204  (H) 100 - 199 mg/dL   Triglycerides 148 0 - 149 mg/dL   HDL 48 >39 mg/dL   VLDL Cholesterol Cal 26 5 - 40 mg/dL   LDL Chol Calc (NIH) 130 (H) 0 - 99 mg/dL   Chol/HDL Ratio 4.3 0.0 - 5.0 ratio  CMP14+EGFR  Result Value Ref Range   Glucose 92 70 - 99 mg/dL   BUN 14 8 - 27 mg/dL   Creatinine, Ser 1.06 0.76 - 1.27 mg/dL   eGFR 77 >59 mL/min/1.73   BUN/Creatinine Ratio 13 10 - 24   Sodium 139 134 - 144 mmol/L   Potassium 4.9 3.5 - 5.2 mmol/L   Chloride 98 96 - 106 mmol/L   CO2 25 20 - 29 mmol/L   Calcium 9.3 8.6 - 10.2 mg/dL   Total Protein 6.6 6.0 - 8.5 g/dL   Albumin 4.1 3.8 - 4.8 g/dL   Globulin, Total 2.5 1.5 - 4.5 g/dL   Albumin/Globulin Ratio 1.6 1.2 - 2.2   Bilirubin Total 0.6 0.0 - 1.2 mg/dL   Alkaline Phosphatase 68 44 - 121 IU/L   AST 27 0 - 40 IU/L   ALT 33 0 - 44 IU/L        Current Outpatient Medications:  .  aspirin 81 MG EC tablet, Take 1 tablet (81 mg total) by mouth daily. Swallow whole., Disp: 30 tablet, Rfl: 12 .  Flaxseed, Linseed, (FLAXSEED OIL) 1000 MG CAPS, Take 1,000 mg by mouth daily. , Disp: , Rfl:  .  allopurinol (ZYLOPRIM) 300 MG tablet, Take 1 tablet (300 mg total) by mouth daily. Needs appt for further refills, Disp: 90 tablet, Rfl: 2 .  benazepril (LOTENSIN) 40 MG tablet, Take 1 tablet (40 mg total) by mouth daily., Disp: 90 tablet, Rfl: 2    Assessment & Plan:  Htn is on benzapril ,increase dose to 40 mg   start pt on asa 81 mg.  Continue current meds.  Medication compliance emphasised. pt  advised to keep Bp logs. Pt verbalised understanding of the same. Pt to have a low salt diet . Exercise to reach a goal of at least 150 mins a week.  lifestyle modifications explained and pt understands importance of the above.  2. HLD: recheck FLP, check LFT's work on diet, SE of meds explained to pt. low fat and high fiber diet explained to pt.  3. Obesity  Lifestyle modifications advised to pt.  Portion control and avoiding high carb low fat diet advised.   Diet plan given to pt   exercise plan given and encouraged.  To increase exercise to 150 mins a week ie 21/2 hours a week. Pt verbalises understanding of the above.   4. Irregular HR : EKG shows : Atrial fibrillation HR - 91 bpm Will refer to Cards STAT . ? Unsure how loing pt has been in afib , will start ASA 81 mg  ? Needs anticoagulation    Problem List Items Addressed This Visit       Cardiovascular and Mediastinum   Hypertension - Primary   Relevant Medications   benazepril (LOTENSIN) 40 MG tablet   aspirin 81 MG EC tablet     Other   Acute gout   Relevant Medications   allopurinol (ZYLOPRIM) 300 MG tablet   Obesity   Other Visit Diagnoses     Heart rate fast       Relevant Orders   EKG 12-Lead (Completed)        Orders Placed This Encounter  Procedures  . EKG 12-Lead      Meds ordered this encounter  Medications  . benazepril (LOTENSIN) 40 MG tablet    Sig: Take 1 tablet (40 mg total) by mouth daily.    Dispense:  90 tablet    Refill:  2  . allopurinol (ZYLOPRIM) 300 MG tablet    Sig: Take 1 tablet (300 mg total) by mouth daily. Needs appt for further refills    Dispense:  90 tablet    Refill:  2  . aspirin 81 MG EC tablet    Sig: Take 1 tablet (81 mg total) by mouth daily. Swallow whole.    Dispense:  30 tablet    Refill:  12      Follow up plan: Return in about 3 weeks (around 01/29/2021).

## 2021-01-09 ENCOUNTER — Other Ambulatory Visit: Payer: Self-pay

## 2021-01-09 ENCOUNTER — Ambulatory Visit (INDEPENDENT_AMBULATORY_CARE_PROVIDER_SITE_OTHER): Payer: Medicare Other | Admitting: Cardiovascular Disease

## 2021-01-09 ENCOUNTER — Encounter: Payer: Self-pay | Admitting: Cardiovascular Disease

## 2021-01-09 VITALS — BP 180/110 | HR 90 | Ht 71.0 in | Wt 272.0 lb

## 2021-01-09 DIAGNOSIS — I482 Chronic atrial fibrillation, unspecified: Secondary | ICD-10-CM

## 2021-01-09 MED ORDER — CARVEDILOL 6.25 MG PO TABS: 6.2500 mg | ORAL_TABLET | Freq: Two times a day (BID) | ORAL | 3 refills | Status: DC

## 2021-01-09 MED ORDER — RIVAROXABAN 20 MG PO TABS: 20.0000 mg | ORAL_TABLET | Freq: Every day | ORAL | 11 refills | Status: DC

## 2021-01-09 NOTE — Progress Notes (Signed)
Cardiology Office Note   Date:  01/09/2021   ID:  Travis Diaz, DOB 15-Aug-1955, MRN [id number]  PCP:  Travis Cousins, MD  Cardiologist:   [name], MD   Chief Complaint  Patient presents with   NEW patient-new onset A Fib      History of Present Illness: Travis Diaz is a 65 y.o. male who was referred by Dr. Neomia Dear for evaluation management of newly diagnosed atrial fibrillation.  The patient is not aware of any previous cardiac history.  He has known history of essential hypertension, hyperlipidemia, obesity, tobacco use and alcohol use.  He quit smoking in 2006.  He drinks beer and liquor.  Sometimes he drinks 15 beers when he is watching baseball but he reports that he does not do that very often and denies excessive drinking.  He went to see his primary care physician yesterday and was noted to be in atrial fibrillation.  The patient reports that he did have shortness of breath earlier in the year but his symptoms gradually improved and currently he feels great with no chest pain, shortness of breath or palpitations.  No dizziness.  His blood pressure continues to be elevated.  The dose of benazepril was increased yesterday but has not picked up the prescription.  He reports that he snores loud when he drinks alcohol but is not aware of sleep apnea.    Past Medical History:  Diagnosis Date   Gout    Hypertension     Past Surgical History:  Procedure Laterality Date   broken arm     COLONOSCOPY WITH PROPOFOL N/A 05/14/2016   Procedure: COLONOSCOPY WITH PROPOFOL;  Surgeon: Travis Silvas, MD;  Location: [facility] ENDOSCOPY;  Service: Endoscopy;  Laterality: N/A;   upper leg procedure Right    removal of blood clots     Current Outpatient Medications  Medication Sig Dispense Refill   allopurinol (ZYLOPRIM) 300 MG tablet Take 1 tablet (300 mg total) by mouth daily. Needs appt for further refills 90 tablet 2   benazepril (LOTENSIN) 20 MG tablet Take 20 mg by  mouth daily.     Flaxseed, Linseed, (FLAXSEED OIL) 1000 MG CAPS Take 1,000 mg by mouth daily.      meloxicam (MOBIC) 15 MG tablet Take 15 mg by mouth daily as needed for pain.     aspirin 81 MG EC tablet Take 1 tablet (81 mg total) by mouth daily. Swallow whole. (Patient not taking: Reported on 01/09/2021) 30 tablet 12   No current facility-administered medications for this visit.    Allergies:   Elemental sulfur    Social History:  The patient  reports that he quit smoking about 16 years ago. His smoking use included cigarettes. He has never used smokeless tobacco. He reports current alcohol use of about 24.0 standard drinks per week. He reports that he does not use drugs.   Family History:  The patient's family history includes Cancer in his father and mother; Dementia in his maternal grandmother; Diabetes in his sister; Early death in his father and paternal grandfather; Heart attack in his father, maternal grandfather, paternal grandfather, and paternal grandmother; Heart disease in his father.    ROS:  Please see the history of present illness.   Otherwise, review of systems are positive for none.   All other systems are reviewed and negative.    PHYSICAL EXAM: VS:  BP (!) 180/110 (BP Location: Right Arm, Patient Position: Sitting, Cuff Size: Large)  Pulse 90   Ht 5\' 11"  (1.803 m)   Wt 272 lb (123.4 kg)   SpO2 97%   BMI 37.94 kg/m  , BMI Body mass index is 37.94 kg/m. GEN: Well nourished, well developed, in no acute distress  HEENT: normal  Neck: no JVD, carotid bruits, or masses Cardiac: Irregularly irregular; no murmurs, rubs, or gallops,no edema  Respiratory:  clear to auscultation bilaterally, normal work of breathing GI: soft, nontender, nondistended, + BS MS: no deformity or atrophy  Skin: warm and dry, no rash Neuro:  Strength and sensation are intact Psych: euthymic mood, full affect   EKG:  EKG is ordered today. The ekg ordered today demonstrates atrial  fibrillation with possible old septal infarct.  Ventricular rate is 90 bpm.   Recent Labs: 10/02/2020: Hemoglobin 17.0; Platelets 257 12/31/2020: ALT 33; BUN 14; Creatinine, Ser 1.06; Potassium 4.9; Sodium 139    Lipid Panel    Component Value Date/Time   CHOL 204 (H) 12/31/2020 0819   TRIG 148 12/31/2020 0819   HDL 48 12/31/2020 0819   CHOLHDL 4.3 12/31/2020 0819   LDLCALC 130 (H) 12/31/2020 0819      Wt Readings from Last 3 Encounters:  01/09/21 272 lb (123.4 kg)  01/08/21 274 lb 3.2 oz (124.4 kg)  10/09/20 281 lb 3.2 oz (127.6 kg)       PAD Screen 01/09/2021  Previous PAD dx? No  Previous surgical procedure? No  Pain with walking? No  Feet/toe relief with dangling? No  Painful, non-healing ulcers? No  Extremities discolored? No      ASSESSMENT AND PLAN:  1.  Atrial fibrillation: Unknown onset.  Currently with no symptoms.  I discussed with him the natural history and management of atrial fibrillation.  His CHA2DS2-VASc score is 2 and thus I recommend long-term anticoagulation.  I elected to start him on Xarelto 20 mg once daily with food.  I requested an echocardiogram.  I added carvedilol 6.25 mg twice daily both for better blood pressure and heart rate control.  Reevaluate symptoms in 1 month and will consider proceeding with cardioversion although that might not be needed given lack of symptoms at the present time. I strongly advised him to get part D Medicare in order to help with his medications.  He was provided with samples for Xarelto today.  2.  Essential hypertension: Blood pressure continues to be very elevated.  The dose of benazepril was increased yesterday but has not picked up the prescription.  In addition, I added carvedilol 6.25 mg twice daily today.  3.  Possible sleep apnea: This might be contributing to atrial fibrillation and we should consider referral for sleep study in the near future.  4.  Obesity: The patient wants to lose weight and he is  trying to improve his lifestyle.    Disposition:   FU with me in 1 month  Signed,  [name], MD  01/09/2021 8:54 AM    [place]

## 2021-01-09 NOTE — Patient Instructions (Addendum)
Medication Instructions:  1.Stop Aspirin 81 mg 2.Start Xarelto 20 mg, take one tablet by mouth daily with your largest meal of the day 3.Start carvedilol 6.25 mg, take one tablet by mouth twice a day *If you need a refill on your cardiac medications before your next appointment, please call your pharmacy*   Lab Work: None If you have labs (blood work) drawn today and your tests are completely normal, you will receive your results only by: [place] (if you have MyChart) OR A paper copy in the mail If you have any lab test that is abnormal or we need to change your treatment, we will call you to review the results.   Testing/Procedures: Your physician has requested that you have an echocardiogram. Echocardiography is a painless test that uses sound waves to create images of your heart. It provides your doctor with information about the size and shape of your heart and how well your heart's chambers and valves are working. This procedure takes approximately one hour. There are no restrictions for this procedure.    Follow-Up: At [facility], you and your health needs are our priority.  As part of our continuing mission to provide you with exceptional heart care, we have created designated Provider Care Teams.  These Care Teams include your primary Cardiologist (physician) and Advanced Practice Providers (APPs -  Physician Assistants and Nurse Practitioners) who all work together to provide you with the care you need, when you need it.  We recommend signing up for the patient portal called "MyChart".  Sign up information is provided on this After Visit Summary.  MyChart is used to connect with patients for Virtual Visits (Telemedicine).  Patients are able to view lab/test results, encounter notes, upcoming appointments, etc.  Non-urgent messages can be sent to your provider as well.   To learn more about what you can do with MyChart, go to [email or web address].    Your next  appointment:   1 month(s)  The format for your next appointment:   In Person  Provider:   [name], MD

## 2021-01-22 ENCOUNTER — Encounter: Payer: Self-pay | Admitting: Internal Medicine

## 2021-01-22 ENCOUNTER — Ambulatory Visit (INDEPENDENT_AMBULATORY_CARE_PROVIDER_SITE_OTHER): Payer: Medicare Other | Admitting: Internal Medicine

## 2021-01-22 ENCOUNTER — Other Ambulatory Visit: Payer: Self-pay

## 2021-01-22 VITALS — BP 128/86 | HR 84 | Temp 99.0°F | Ht 70.0 in | Wt 272.4 lb

## 2021-01-22 DIAGNOSIS — I482 Chronic atrial fibrillation, unspecified: Secondary | ICD-10-CM | POA: Diagnosis not present

## 2021-01-22 DIAGNOSIS — Z6839 Body mass index (BMI) 39.0-39.9, adult: Secondary | ICD-10-CM

## 2021-01-22 DIAGNOSIS — I10 Essential (primary) hypertension: Secondary | ICD-10-CM | POA: Diagnosis not present

## 2021-01-22 DIAGNOSIS — F1010 Alcohol abuse, uncomplicated: Secondary | ICD-10-CM | POA: Insufficient documentation

## 2021-01-22 NOTE — Patient Instructions (Signed)
Cardiomyopathy, Adult Cardiomyopathy is a long-term (chronic) disease of the heart muscle. The disease makes the heart muscle thick, weak, or stiff. As a result, the heart works harder to pump blood. Over time, cardiomyopathy can lead to an irregular heartbeat (arrhythmia) and a condition in which the heart has trouble pumping blood (heart failure). There are several types of cardiomyopathy. The kind of cardiomyopathy that you have depends on what part of the heart is affected and how it is affected. What are the causes? This condition may be caused by: A medical condition that damages the heart, such as diabetes, high blood pressure, or heart attack. Diseases of the immune system, connective tissues, or endocrine system. Alcohol abuse, use of illegal drugs, and taking certain medicines. Pregnancy. Too much iron in the body (hemochromatosis). Cancer treatments. Protein buildup in the organs or inflammation in the organs. In some cases, the cause is not known. What increases the risk? You are more likely to develop this condition if: You have a gene that is passed down (inherited) from a family member. You are overweight or obese. What are the signs or symptoms? Often, people with this condition have no symptoms. If you do have symptoms, this may include: Shortness of breath, especially during activity. Fatigue. An arrhythmia. Feeling dizzy or light-headed, or fainting. Chest pain. Coughing. Swelling of the lower legs, feet, abdomen, or neck veins. How is this diagnosed? This condition is diagnosed based on: Your symptoms and medical history. A physical exam. Blood tests and imaging studies, such as X-ray, echocardiogram, or MRI. Other tests, such as: An electrocardiogram (ECG). A portable heart monitor that records your heart's electrical activity (event monitor). A stress test. Cardiac catheterization and coronary angiogram. Heart tissue biopsy. An MRI of the heart. How is this  treated? Your treatment depends on the type and severity of your symptoms. If you do not have symptoms, you may not need treatment. Treatment may include: General healthy lifestyle changes. Medicines to: Treat high blood pressure, abnormal heart rate, or inflammation. Clear excess fluids from your body. Prevent blood clots. Balance minerals (electrolytes) in your body and get rid of extra sodium in your body. Strengthen your heartbeat. Surgery to: Repair a heart defect, remove heart tissue, or destroy tissues in the area of abnormal electrical activity (ablation). Implant a device to treat serious heart rhythm problems or to restore the heart's ability to pump blood, such as a pacemaker or a defibrillator. Replace your heart with a healthy heart. This is done if all other treatments have failed. Other treatment may include cardiac resynchronization therapy (CRT). This restores the heart's normal beating pattern. Follow these instructions at home: Medicines Take over-the-counter and prescription medicines only as told by your health care provider. Some medicines can be dangerous for your heart. If you are prescribed a blood thinner, make sure you understand what to do in a bleeding situation. Tell all health care providers, including your dentist, that you have cardiomyopathy. Ask your health care provider if you need antibiotics before having dental care or before surgery. Lifestyle  Eat a heart-healthy diet that includes plenty of fruits, vegetables, whole grains, and foods that are low in salt (sodium). Maintain a healthy weight. Stay physically active. Ask your health care provider what activities are safe for you. Do not use any products that contain nicotine or tobacco, such as cigarettes, e-cigarettes, and chewing tobacco. If you need help quitting, ask your health care provider. Try to get at least 7 hours of sleep each night.  Find healthy ways to manage stress. Alcohol use Do not  drink alcohol if: Your health care provider tells you not to drink. You are pregnant, may be pregnant, or are planning to become pregnant. If you drink alcohol: Limit how much you use to: 0-1 drink a day for women. 0-2 drinks a day for men. Be aware of how much alcohol is in your drink. In the [place] one drink equals one 12 oz bottle of beer (355 mL), one 5 oz glass of wine (148 mL), or one 1 oz glass of hard liquor (44 mL). General instructions Ask your health care provider if you should wear a medical identification bracelet. This may be important if you have a pacemaker or a defibrillator. Get all needed vaccines. Get a flu shot every year. Work closely with your health care provider to manage any other chronic conditions. If you plan to start a family, talk with a genetic counselor to discuss the risk of having a child with cardiomyopathy. Keep all follow-up visits as told by your health care provider. This is important, even if you do not have any symptoms. Your health care provider may need to make sure your condition is not getting worse. Where to find more information American Heart Association: www.heart.org Contact a health care provider if: Your symptoms get worse. You have new symptoms. Get help right away if you: Have severe chest pain. Have shortness of breath. Cough up a pink, bubbly substance. Feel nauseous and you vomit. Suddenly become light-headed or dizzy. Feel your heart beating very quickly. Feel like your heart is skipping beats. These symptoms may represent a serious problem that is an emergency. Do not wait to see if the symptoms will go away. Get medical help right away. Call your local emergency services (911 in the [place] Do not drive yourself to the hospital. Summary Cardiomyopathy is a long-term (chronic) disease of the heart muscle. Over time, cardiomyopathy can lead to an irregular heartbeat (arrhythmia) and heart failure. A number of treatments are  available for this condition. Your treatment will depend on the type and severity of your symptoms. Follow your health care provider's instructions on diet, medicines, physical activity, and when to seek help. This information is not intended to replace advice given to you by your health care provider. Make sure you discuss any questions you have with your health care provider. Document Revised: 12/20/2018 Document Reviewed: 12/20/2018 Elsevier Patient Education  [place].

## 2021-01-22 NOTE — Progress Notes (Signed)
BP 128/86   Pulse 84   Temp 99 F (37.2 C) (Oral)   Ht 5' 10"  (1.778 m)   Wt 272 lb 6 oz (123.5 kg)   SpO2 95%   BMI 39.08 kg/m    Subjective:    Patient ID: Travis Diaz, male    DOB: 11-Dec-1955, 65 y.o.   MRN: [id number]  Chief Complaint  Patient presents with   Hypertension    Blood pressure recheck    HPI: Travis Diaz is a 65 y.o. male  Hypertension   Chief Complaint  Patient presents with   Hypertension    Blood pressure recheck    Relevant past medical, surgical, family and social history reviewed and updated as indicated. Interim medical history since our last visit reviewed. Allergies and medications reviewed and updated.  Review of Systems  Per HPI unless specifically indicated above     Objective:    BP 128/86   Pulse 84   Temp 99 F (37.2 C) (Oral)   Ht 5' 10"  (1.778 m)   Wt 272 lb 6 oz (123.5 kg)   SpO2 95%   BMI 39.08 kg/m   Wt Readings from Last 3 Encounters:  01/22/21 272 lb 6 oz (123.5 kg)  01/09/21 272 lb (123.4 kg)  01/08/21 274 lb 3.2 oz (124.4 kg)    Physical Exam Vitals and nursing note reviewed.  Constitutional:      General: He is not in acute distress.    Appearance: Normal appearance. He is not ill-appearing or diaphoretic.  HENT:     Head: Normocephalic and atraumatic.     Right Ear: Tympanic membrane and external ear normal. There is no impacted cerumen.     Left Ear: External ear normal.     Nose: No congestion or rhinorrhea.     Mouth/Throat:     Pharynx: No oropharyngeal exudate or posterior oropharyngeal erythema.  Eyes:     Conjunctiva/sclera: Conjunctivae normal.     Pupils: Pupils are equal, round, and reactive to light.  Cardiovascular:     Rate and Rhythm: Normal rate and regular rhythm.     Heart sounds: No murmur heard.   No friction rub. No gallop.  Pulmonary:     Effort: No respiratory distress.     Breath sounds: No stridor. No wheezing or rhonchi.  Chest:     Chest wall: No  tenderness.  Abdominal:     General: Abdomen is flat. Bowel sounds are normal.     Palpations: Abdomen is soft. There is no mass.     Tenderness: There is no abdominal tenderness.  Musculoskeletal:        General: No swelling, tenderness, deformity or signs of injury.     Cervical back: Normal range of motion and neck supple. No rigidity or tenderness.     Right lower leg: No edema.     Left lower leg: No edema.  Skin:    General: Skin is warm and dry.  Neurological:     Mental Status: He is alert.    Results for orders placed or performed in visit on 12/31/20  Lipid panel  Result Value Ref Range   Cholesterol, Total 204 (H) 100 - 199 mg/dL   Triglycerides 148 0 - 149 mg/dL   HDL 48 >39 mg/dL   VLDL Cholesterol Cal 26 5 - 40 mg/dL   LDL Chol Calc (NIH) 130 (H) 0 - 99 mg/dL   Chol/HDL Ratio 4.3 0.0 - 5.0 ratio  CMP14+EGFR  Result Value Ref Range   Glucose 92 70 - 99 mg/dL   BUN 14 8 - 27 mg/dL   Creatinine, Ser 1.06 0.76 - 1.27 mg/dL   eGFR 77 >59 mL/min/1.73   BUN/Creatinine Ratio 13 10 - 24   Sodium 139 134 - 144 mmol/L   Potassium 4.9 3.5 - 5.2 mmol/L   Chloride 98 96 - 106 mmol/L   CO2 25 20 - 29 mmol/L   Calcium 9.3 8.6 - 10.2 mg/dL   Total Protein 6.6 6.0 - 8.5 g/dL   Albumin 4.1 3.8 - 4.8 g/dL   Globulin, Total 2.5 1.5 - 4.5 g/dL   Albumin/Globulin Ratio 1.6 1.2 - 2.2   Bilirubin Total 0.6 0.0 - 1.2 mg/dL   Alkaline Phosphatase 68 44 - 121 IU/L   AST 27 0 - 40 IU/L   ALT 33 0 - 44 IU/L        Current Outpatient Medications:    allopurinol (ZYLOPRIM) 300 MG tablet, Take 1 tablet (300 mg total) by mouth daily. Needs appt for further refills, Disp: 90 tablet, Rfl: 2   benazepril (LOTENSIN) 20 MG tablet, Take 20 mg by mouth daily., Disp: , Rfl:    Flaxseed, Linseed, (FLAXSEED OIL) 1000 MG CAPS, Take 1,000 mg by mouth daily. , Disp: , Rfl:    meloxicam (MOBIC) 15 MG tablet, Take 15 mg by mouth daily as needed for pain., Disp: , Rfl:    rivaroxaban (XARELTO) 20  MG TABS tablet, Take 1 tablet (20 mg total) by mouth daily with supper., Disp: 30 tablet, Rfl: 11   carvedilol (COREG) 6.25 MG tablet, Take 1 tablet (6.25 mg total) by mouth 2 (two) times daily., Disp: 180 tablet, Rfl: 3    Assessment & Plan:  Afib is on coreg 6.25 mg bid [place], to have an echo on 1118 continue Xarelto, Coreg 6.25 twice daily.  Patient advised to look for bleeding in stools and alert his cardiologist if this happens. Probably needs cardioversion if echo is normal  Htn is on benazapril  Continue current meds.  Medication compliance emphasised. pt advised to keep Bp logs. Pt verbalised understanding of the same. Pt to have a low salt diet . Exercise to reach a goal of at least 150 mins a week.  lifestyle modifications explained and pt understands importance of the above.  3.Obesity lost about 12 lbs has been riding a bike @ home 20 mins a day now. Some days 30 mins.  Lifestyle modifications advised to pt. A1c at   Portion control and avoiding high carb low fat diet advised.  Diet plan given to pt   exercise plan given and encouraged.  To increase exercise to 150 mins a week ie 21/2 hours a week. Pt verbalises understanding of the above.  Body mass index is 39.08 kg/m.  4. ETOH ABUSE: consider US abdomen.  Continues to drink 1/2 gallon of liquor and 36 cans of beer.   Ref. Range 10/02/2020 08:08 12/31/2020 08:19  AST Latest Ref Range: 0 - 40 IU/L 21 27  ALT Latest Ref Range: 0 - 44 IU/L 32 33  Total Protein Latest Ref Range: 6.0 - 8.5 g/dL 7.0 6.6  Total Bilirubin Latest Ref Range: 0.0 - 1.2 mg/dL 0.8 0.6      Problem List Items Addressed This Visit   None   No orders of the defined types were placed in this encounter.    No orders of the defined types were placed in this encounter.  Follow up plan: Return in about 6 weeks (around 03/05/2021).

## 2021-02-13 ENCOUNTER — Other Ambulatory Visit: Payer: Self-pay

## 2021-02-13 ENCOUNTER — Ambulatory Visit (INDEPENDENT_AMBULATORY_CARE_PROVIDER_SITE_OTHER): Payer: Medicare Other

## 2021-02-13 DIAGNOSIS — I482 Chronic atrial fibrillation, unspecified: Secondary | ICD-10-CM

## 2021-02-13 LAB — ECHOCARDIOGRAM COMPLETE
AR max vel: 3.8 cm2
AV Area VTI: 3.74 cm2
AV Area mean vel: 3.59 cm2
AV Mean grad: 3 mmHg
AV Peak grad: 4.9 mmHg
Ao pk vel: 1.11 m/s
Area-P 1/2: 4.46 cm2
Calc EF: 65.1 %
S' Lateral: 3.2 cm
Single Plane A2C EF: 58.5 %
Single Plane A4C EF: 70.5 %

## 2021-02-17 ENCOUNTER — Other Ambulatory Visit: Payer: Self-pay

## 2021-02-17 ENCOUNTER — Ambulatory Visit (INDEPENDENT_AMBULATORY_CARE_PROVIDER_SITE_OTHER): Payer: Medicare Other | Admitting: Cardiovascular Disease

## 2021-02-17 ENCOUNTER — Encounter: Payer: Self-pay | Admitting: Cardiovascular Disease

## 2021-02-17 VITALS — BP 164/100 | HR 72 | Ht 71.0 in | Wt 274.5 lb

## 2021-02-17 DIAGNOSIS — I10 Essential (primary) hypertension: Secondary | ICD-10-CM | POA: Diagnosis not present

## 2021-02-17 DIAGNOSIS — I482 Chronic atrial fibrillation, unspecified: Secondary | ICD-10-CM

## 2021-02-17 MED ORDER — WARFARIN SODIUM 5 MG PO TABS: 5.0000 mg | ORAL_TABLET | Freq: Every day | ORAL | 0 refills | Status: DC

## 2021-02-17 NOTE — Patient Instructions (Signed)
Medication Instructions:  Your physician has recommended you make the following change in your medication:   1) STOP Xarelto  2) On Saturday 02/21/21 START Coumadin (wafarin) 5 mg daily. An Rx has been sent to your pharmacy. You will need an appt with our Coumadin Clinic on Wed 02/25/21  *If you need a refill on your cardiac medications before your next appointment, please call your pharmacy*   Lab Work: None ordered If you have labs (blood work) drawn today and your tests are completely normal, you will receive your results only by: [place] (if you have MyChart) OR A paper copy in the mail If you have any lab test that is abnormal or we need to change your treatment, we will call you to review the results.   Testing/Procedures: None ordered   Follow-Up: At [facility], you and your health needs are our priority.  As part of our continuing mission to provide you with exceptional heart care, we have created designated Provider Care Teams.  These Care Teams include your primary Cardiologist (physician) and Advanced Practice Providers (APPs -  Physician Assistants and Nurse Practitioners) who all work together to provide you with the care you need, when you need it.  We recommend signing up for the patient portal called "MyChart".  Sign up information is provided on this After Visit Summary.  MyChart is used to connect with patients for Virtual Visits (Telemedicine).  Patients are able to view lab/test results, encounter notes, upcoming appointments, etc.  Non-urgent messages can be sent to your provider as well.   To learn more about what you can do with MyChart, go to [email or web address].    Your next appointment:   4 month(s)  You will need an appt with our Coumadin Clinic on Wed 02/25/21 for management of your coumadin (wafarin)  The format for your next appointment:   In Person  Provider:   You may see [name], MD or one of the following Advanced Practice  Providers on your designated Care Team:   Murray Hodgkins, NP Christell Faith, PA-C Cadence Kathlen Mody, [place]    Other Instructions N/A

## 2021-02-17 NOTE — Progress Notes (Signed)
Cardiology Office Note   Date:  02/17/2021   ID:  Travis Diaz, DOB 26-Jun-1955, MRN [id number]  PCP:  [name], MD  Cardiologist:   [name], MD   Chief Complaint  Patient presents with   Other    1 month f/u pt would like to discuss alternative for Xarelto to expensive. Pt refused EKG today. Meds reviewed verbally with pt.       History of Present Illness: Travis Diaz is a 65 y.o. male who is here today for follow-up visit regarding atrial fibrillation.   The patient is not aware of any previous cardiac history.  He has known history of essential hypertension, hyperlipidemia, obesity, tobacco use and alcohol use.  He quit smoking in 2006.  He drinks beer and liquor.  Sometimes he drinks 15 beers when he is watching baseball but he reports that he does not do that very often and denies excessive drinking.   He was noted incidentally to be in atrial fibrillation during an office visit in October.  Thus, he was referred for evaluation.  During my initial evaluation, I added carvedilol for blood pressure control and also heart rate control.  In addition, he was started on Xarelto for anticoagulation.   An echocardiogram was done recently and showed normal LV systolic function with moderate biatrial enlargement and moderate mitral regurgitation. He took the last dose of Xarelto today and reports inability to afford.  Cost more than $500 and he does not want to get additional prescription coverage through his insurance due to increased premium.  He wants alternatives.  He denies chest pain, shortness of breath or palpitations.   Past Medical History:  Diagnosis Date   Gout    Hypertension     Past Surgical History:  Procedure Laterality Date   broken arm     COLONOSCOPY WITH PROPOFOL N/A 05/14/2016   Procedure: COLONOSCOPY WITH PROPOFOL;  Surgeon: Manya Silvas, MD;  Location: [facility] ENDOSCOPY;  Service: Endoscopy;  Laterality: N/A;   upper leg procedure  Right    removal of blood clots     Current Outpatient Medications  Medication Sig Dispense Refill   allopurinol (ZYLOPRIM) 300 MG tablet Take 1 tablet (300 mg total) by mouth daily. Needs appt for further refills 90 tablet 2   benazepril (LOTENSIN) 20 MG tablet Take 20 mg by mouth daily.     carvedilol (COREG) 6.25 MG tablet Take 1 tablet (6.25 mg total) by mouth 2 (two) times daily. 180 tablet 3   Flaxseed, Linseed, (FLAXSEED OIL) 1000 MG CAPS Take 1,000 mg by mouth daily.      meloxicam (MOBIC) 15 MG tablet Take 15 mg by mouth daily as needed for pain.     rivaroxaban (XARELTO) 20 MG TABS tablet Take 1 tablet (20 mg total) by mouth daily with supper. (Patient not taking: Reported on 02/17/2021) 30 tablet 11   No current facility-administered medications for this visit.    Allergies:   Elemental sulfur    Social History:  The patient  reports that he quit smoking about 16 years ago. His smoking use included cigarettes. He has never used smokeless tobacco. He reports current alcohol use of about 24.0 standard drinks per week. He reports that he does not use drugs.   Family History:  The patient's family history includes Cancer in his father and mother; Dementia in his maternal grandmother; Diabetes in his sister; Early death in his father and paternal grandfather; Heart attack in his  father, maternal grandfather, paternal grandfather, and paternal grandmother; Heart disease in his father.    ROS:  Please see the history of present illness.   Otherwise, review of systems are positive for none.   All other systems are reviewed and negative.    PHYSICAL EXAM: VS:  BP (!) 164/100 (BP Location: Left Arm, Patient Position: Sitting, Cuff Size: Normal)   Pulse 72   Ht 5\' 11"  (1.803 m)   Wt 274 lb 8 oz (124.5 kg)   SpO2 98%   BMI 38.28 kg/m  , BMI Body mass index is 38.28 kg/m. GEN: Well nourished, well developed, in no acute distress  HEENT: normal  Neck: no JVD, carotid bruits, or  masses Cardiac: Irregularly irregular; no murmurs, rubs, or gallops,no edema  Respiratory:  clear to auscultation bilaterally, normal work of breathing GI: soft, nontender, nondistended, + BS MS: no deformity or atrophy  Skin: warm and dry, no rash Neuro:  Strength and sensation are intact Psych: euthymic mood, full affect   EKG:  EKG is not ordered today.    Recent Labs: 10/02/2020: Hemoglobin 17.0; Platelets 257 12/31/2020: ALT 33; BUN 14; Creatinine, Ser 1.06; Potassium 4.9; Sodium 139    Lipid Panel    Component Value Date/Time   CHOL 204 (H) 12/31/2020 0819   TRIG 148 12/31/2020 0819   HDL 48 12/31/2020 0819   CHOLHDL 4.3 12/31/2020 0819   LDLCALC 130 (H) 12/31/2020 0819      Wt Readings from Last 3 Encounters:  02/17/21 274 lb 8 oz (124.5 kg)  01/22/21 272 lb 6 oz (123.5 kg)  01/09/21 272 lb (123.4 kg)       PAD Screen 01/09/2021  Previous PAD dx? No  Previous surgical procedure? No  Pain with walking? No  Feet/toe relief with dangling? No  Painful, non-healing ulcers? No  Extremities discolored? No      ASSESSMENT AND PLAN:  1.  Atrial fibrillation: Unknown onset but likely chronic at this point.  Given moderately dilated atria on echocardiogram, I think it will be difficult to get him in sinus rhythm without an antiarrhythmic medication.  I do not think that is justified considering that he is asymptomatic.  Thus, I recommend continuing rate control and anticoagulation.   His CHA2DS2-VASc score is 2 .  He is not able to afford Xarelto and does not have prescription coverage.  I discussed alternative anticoagulation with warfarin and he is interested.  I elected to add warfarin 5 mg daily and will arrange for him to follow-up in our anticoagulation clinic next week.  He will require education.    2.  Essential hypertension: Blood pressure is elevated today.  He admits that he misses the second dose of carvedilol frequently.  Discussed the importance of  compliance with antihypertensive medications.  3.  Possible sleep apnea: This might be contributing to atrial fibrillation and we should consider referral for sleep study in the near future.  The patient is not interested.  4.  Obesity: The patient wants to lose weight and he is trying to improve his lifestyle.    Disposition:   FU with me in 4 month  Signed,  [name], MD  02/17/2021 4:06 PM    [facility]

## 2021-02-25 ENCOUNTER — Other Ambulatory Visit: Payer: Self-pay

## 2021-02-25 ENCOUNTER — Ambulatory Visit (INDEPENDENT_AMBULATORY_CARE_PROVIDER_SITE_OTHER): Payer: Medicare Other

## 2021-02-25 DIAGNOSIS — Z7901 Long term (current) use of anticoagulants: Secondary | ICD-10-CM

## 2021-02-25 DIAGNOSIS — I482 Chronic atrial fibrillation, unspecified: Secondary | ICD-10-CM

## 2021-02-25 LAB — POCT INR: INR: 1.6 — AB (ref 2.0–3.0)

## 2021-03-04 ENCOUNTER — Ambulatory Visit (INDEPENDENT_AMBULATORY_CARE_PROVIDER_SITE_OTHER): Payer: Medicare Other

## 2021-03-04 ENCOUNTER — Other Ambulatory Visit: Payer: Self-pay

## 2021-03-04 DIAGNOSIS — Z7901 Long term (current) use of anticoagulants: Secondary | ICD-10-CM | POA: Diagnosis not present

## 2021-03-04 DIAGNOSIS — I482 Chronic atrial fibrillation, unspecified: Secondary | ICD-10-CM | POA: Diagnosis not present

## 2021-03-04 LAB — POCT INR: INR: 3.3 — AB (ref 2.0–3.0)

## 2021-03-04 MED ORDER — WARFARIN SODIUM 5 MG PO TABS
5.0000 mg | ORAL_TABLET | Freq: Every day | ORAL | 1 refills | Status: DC
Start: 2021-03-04 — End: 2021-04-08

## 2021-03-04 NOTE — Patient Instructions (Addendum)
-   since you've already taken your warfarin today, try to have a serving of greens today, - take 1/2 tablet tomorrow, then - continue taking warfarin 1 tablet Daily - recheck INR 1 week

## 2021-03-05 ENCOUNTER — Ambulatory Visit: Payer: Medicare Other | Admitting: Internal Medicine

## 2021-03-11 ENCOUNTER — Other Ambulatory Visit: Payer: Self-pay

## 2021-03-11 ENCOUNTER — Encounter: Payer: Self-pay | Admitting: Internal Medicine

## 2021-03-11 ENCOUNTER — Ambulatory Visit (INDEPENDENT_AMBULATORY_CARE_PROVIDER_SITE_OTHER): Payer: Medicare Other | Admitting: Internal Medicine

## 2021-03-11 ENCOUNTER — Ambulatory Visit (INDEPENDENT_AMBULATORY_CARE_PROVIDER_SITE_OTHER): Payer: Medicare Other

## 2021-03-11 VITALS — BP 132/88 | HR 80 | Temp 98.7°F | Ht 70.87 in | Wt 278.4 lb

## 2021-03-11 DIAGNOSIS — I4891 Unspecified atrial fibrillation: Secondary | ICD-10-CM | POA: Diagnosis not present

## 2021-03-11 DIAGNOSIS — I482 Chronic atrial fibrillation, unspecified: Secondary | ICD-10-CM

## 2021-03-11 DIAGNOSIS — Z7901 Long term (current) use of anticoagulants: Secondary | ICD-10-CM | POA: Diagnosis not present

## 2021-03-11 DIAGNOSIS — Z5181 Encounter for therapeutic drug level monitoring: Secondary | ICD-10-CM

## 2021-03-11 DIAGNOSIS — E7800 Pure hypercholesterolemia, unspecified: Secondary | ICD-10-CM

## 2021-03-11 LAB — POCT INR: INR: 3.4 — AB (ref 2.0–3.0)

## 2021-03-11 NOTE — Patient Instructions (Signed)
-   since you've already taken your warfarin today, have a serving of greens - skip warfarin tomorrow, - START NEW DOSAGE OF 1 tablet every day EXCEPT 1/2 TABLET ON MONDAYS AND FRIDAYS.

## 2021-03-11 NOTE — Patient Instructions (Signed)
Warfarin Information Warfarin is a blood thinner (anticoagulant). Anticoagulants help to prevent the formation of blood clots or keep them from getting bigger. Your health care provider will monitor the anticoagulation effect of warfarin closely and will adjust your medicine as needed. Who should use warfarin? Warfarin is prescribed for people who have blood clots, or who are at risk for developing harmful blood clots, such as people who: Have mechanical heart valves. Have irregular heart rhythms (atrial fibrillation). Have certain clotting disorders. Have had blood clots in the past or are currently receiving treatment for them. This includes people who have had a stroke, blood clots in the lungs (pulmonary embolism, or PE), or blood clots in the legs (deep vein thrombosis,or DVT). How is warfarin taken? Warfarin is taken by mouth (orally). Warfarin tablets come in different strengths. The strength is printed on the tablet, and each strength is a different color. If you get a new prescription and the color of your tablet is different than usual, tell your pharmacist or health care provider immediately. Take warfarin exactly as told by your health care provider, at the same time every day. Doing this helps you avoid bleeding or blood clots that could result in serious injury, pain, or disability. Contact your health care provider if a dose is forgotten or missed. Do not change or take additional dosesto make up for missed or accidental extra doses. What blood tests do I need while taking warfarin? Warfarin is a medicine that needs to be closely monitored with blood tests. It is very important to keep all lab visits and follow-up visits with your health care provider. These tests measure the blood's ability to clot and are called prothrombin tests (PT)or international normalized ratio (INR) tests. These tests can be done with a finger stick or a blood draw. What does the INR test result mean? The PT  test results will be reported as the INR. Your health care provider will tell you your target INR range. If your INR is not in your target range, your health care provider may adjust your dosage. If your INR is above your target range, there is a risk of bleeding. Your dosage of warfarin may need to be decreased. If your INR is below your target range, there is a risk of clotting. Your dosage of warfarin may need to be increased. How often is the INR test needed? When you first start warfarin, you will usually have your INR checked every few days until the health care provider determines the correct dosage of warfarin. After you have reached your target INR, your INR will be tested less often. However, you will need to have your INR checked at least once every 4-6 weeks while you take warfarin. Some people may be able to use home monitoring to check their INR. Ask your health care provider if this applies to you. What are the side effects of warfarin? Too much warfarin can cause bleeding or hemorrhage in any part of the body, such as: Bleeding from the gums. Unexplained bruises or bruises that get larger. A nosebleed that is not easily stopped. Bleeding in the brain (hemorrhagic stroke). Coughing up or vomiting blood. Blood in the urine or stools. Warfarin may also cause: Skin rash or irritations. Nausea that does not go away. Severe pain in the back or joints. Painful toes that turn blue or purple (purple toe syndrome). Painful ulcers that do not go away (skin necrosis). What precautions do I need to take while using warfarin? Wear   a medical alert bracelet or carry a card that lists what medicines you take. Make sure that all health care providers, including your dentist, know you are taking warfarin. Avoid situations that cause bleeding by: Using a softer toothbrush. Flossing with waxed floss. Shaving with an electric razor, not with a blade. Limiting your use of sharp  objects. Avoiding activities that put you at risk for injury, such as contact sports. What do I need to know about warfarin and pregnancy or breastfeeding? If you are taking warfarin and you become pregnant, or plan to become pregnant, contact your health care provider right away. Though warfarin has been associated with birth defects, it can be used in some cases after weighing risks to mother and baby. If you plan to breastfeed while taking warfarin, talk with your health care provider first. What do I need to know about warfarin and alcohol or drug use? Do not drink alcohol if: Your health care provider tells you not to drink. You are pregnant, may be pregnant, or are planning to become pregnant. If you drink alcohol: Limit how much you have to: 0-1 drink a day for women. 0-2 drinks a day for men. Know how much alcohol is in your drink. In the [place] one drink equals one 12 oz bottle of beer (355 mL), one 5 oz glass of wine (148 mL), or one 1 oz glass of hard liquor (44 mL). If you change the amount of alcohol that you drink, tell your health care provider. Your warfarin dosage may need to be changed. Do not use any products that contain nicotine or tobacco. These products include cigarettes, chewing tobacco, and vaping devices, such as e-cigarettes. If you need help quitting, ask your health care provider. If you use nicotine or tobacco products and change the amount that you use, tell your health care provider. Your warfarin dosage may need to be changed. Avoid drug use while taking warfarin. The effects of drugs on warfarin are not known. What do I need to know about warfarin and other medicines or supplements? Many prescription and over-the-counter medicines can interfere with warfarin. Talk with your health care provider or your pharmacist before starting or stopping any new medicines. This includes vitamins, herbs, supplements, and pain medicines. Some common over-the-counter medicines  that may increase the risk of dangerous bleeding while taking warfarin include: Aspirin. NSAIDs, such as ibuprofen or naproxen. Vitamin E. Fish oils. What do I need to know about warfarin and my diet? Vitamin K decreases the effect of warfarin, and it is found in many foods. Eat a consistent amount of foods that contain vitamin K. For example, you may decide to eat 2 servings of vitamin K-containing foods each day. It is important to maintain a normal, balanced diet while taking warfarin. Avoid major changes in your diet. If you are going to change your diet, talk with your health care provider before making changes. Your health care provider may recommend that you work with a dietitian. Contact a health care provider if you: Miss a dose. Take an extra dose. Plan to have any kind of surgery or procedure. Ask whether you should stop taking warfarin or change your dose before your surgery. Are unable to take your medicine due to nausea, vomiting, or diarrhea. Have any major changes in your diet, or you plan to make major changes in your diet. Start or stop any over-the-counter medicine, prescription medicine, herbal supplement, or dietary supplement. Become pregnant, plan to become pregnant, or think you may   be pregnant. Have menstrual periods that are heavier than usual. Have unusual bruising. Get help right away if you: Have signs of an allergic reaction, such as: Swelling of the lips, face, tongue, mouth, or throat. Rash or itchy, red, swollen areas of skin (hives). Trouble breathing. Chest tightness. Fall or have an accident, especially if you hit your head. Have signs that your blood is too thin, such as: Blood in your urine. Your urine may look reddish, pinkish, or tea-colored. Blood in your stool. Your stool may be black or bright red. Coughing up or vomiting blood. The blood may be bright red, or it may look like coffee grounds. Bleeding that does not stop after applying pressure  to the area for 30 minutes. Have signs of a blood clot in your leg or arm, such as: Pain or swelling in your leg or arm. Skin that is red or warm to the touch on your arm or leg. Have signs of blood in your lung, such as: Shortness of breath or difficulty breathing. Chest pain. Unexplained fever. Have any symptoms of a stroke. "BE FAST" is an easy way to remember the main warning signs of a stroke: B - Balance. Signs are dizziness, sudden trouble walking, or loss of balance. E - Eyes. Signs are trouble seeing or a sudden change in vision. F - Face. Signs are sudden weakness or numbness of the face, or the face or eyelid drooping on one side. A - Arms. Signs are weakness or numbness in an arm. This happens suddenly and usually on one side of the body. S - Speech. Signs are sudden trouble speaking, slurred speech, or trouble understanding what people say. T - Time. Time to call emergency services. Write down what time symptoms started. Have other signs of a stroke, such as: A sudden, severe headache with no known cause. Nausea or vomiting. Seizure. Have other signs of a reaction to warfarin, such as: Purple or blue toes. Skin ulcers that do not go away. These symptoms may represent a serious problem that is an emergency. Do not wait to see if the symptoms will go away. Get medical help right away. Call your local emergency services (911 in the [place] Do not drive yourself to the hospital. Summary Warfarin is a medicine that thins blood. It is used to prevent or treat blood clots. You must be monitored closely while on this medicine. Keep all follow-up visits. Make sure that you know your target INR range and your warfarin dosage. Wear or carry identification that says you are taking warfarin. Take warfarin at the same time every day. Call your health care provider if you miss a dose or if you take an extra dose. Do not change the dosage of warfarin on your own. Know the signs and symptoms  of blood clots, bleeding, and a stroke. Know when to get emergency medical help. This information is not intended to replace advice given to you by your health care provider. Make sure you discuss any questions you have with your health care provider. Document Revised: 06/04/2020 Document Reviewed: 06/04/2020 Elsevier Patient Education  2022 [employer] Inc.  

## 2021-03-11 NOTE — Progress Notes (Signed)
BP 132/88    Pulse 80    Temp 98.7 F (37.1 C) (Oral)    Ht 5' 10.87" (1.8 m)    Wt 278 lb 6.4 oz (126.3 kg)    SpO2 96%    BMI 38.98 kg/m    Subjective:    Patient ID: Travis Diaz, male    DOB: Jul 17, 1955, 65 y.o.   MRN: [id number]  Chief Complaint  Patient presents with   Hypertension   Chronic Afib    HPI: Travis Diaz is a 65 y.o. male  Hypertension This is a chronic problem. Pertinent negatives include no chest pain, headaches, palpitations or shortness of breath.  Heart Problem Chronicity: has new onset of afib, is on warfarin INR goal. Pertinent negatives include no abdominal pain, arthralgias, chest pain, chills, congestion, coughing, fatigue, fever, headaches, joint swelling, myalgias, nausea, numbness, rash, vomiting or weakness.   echocardiogram was done recently and showed normal LV systolic function with moderate biatrial enlargement and moderate mitral regurgitation. Is on 5 mg of warfarin now seeing anticoagulation clinic 3.4 today per pts verbal record. He needs to take 2.5 mg twice a week and 5 mg the other days.     Review of Systems  Constitutional:  Negative for activity change, appetite change, chills, fatigue and fever.  HENT:  Negative for congestion, ear discharge, ear pain and facial swelling.   Eyes:  Negative for pain, discharge and itching.  Respiratory:  Negative for cough, chest tightness, shortness of breath and wheezing.   Cardiovascular:  Negative for chest pain, palpitations and leg swelling.  Gastrointestinal:  Negative for abdominal distention, abdominal pain, blood in stool, constipation, diarrhea, nausea and vomiting.  Endocrine: Negative for cold intolerance, heat intolerance, polydipsia, polyphagia and polyuria.  Genitourinary:  Negative for difficulty urinating, dysuria, flank pain, frequency, hematuria and urgency.  Musculoskeletal:  Negative for arthralgias, gait problem, joint swelling and myalgias.  Skin:  Negative  for color change, rash and wound.  Neurological:  Negative for dizziness, tremors, speech difficulty, weakness, light-headedness, numbness and headaches.  Hematological:  Does not bruise/bleed easily.  Psychiatric/Behavioral:  Negative for agitation, confusion, decreased concentration, sleep disturbance and suicidal ideas.    Per HPI unless specifically indicated above     Objective:    BP 132/88    Pulse 80    Temp 98.7 F (37.1 C) (Oral)    Ht 5' 10.87" (1.8 m)    Wt 278 lb 6.4 oz (126.3 kg)    SpO2 96%    BMI 38.98 kg/m   Wt Readings from Last 3 Encounters:  03/11/21 278 lb 6.4 oz (126.3 kg)  02/17/21 274 lb 8 oz (124.5 kg)  01/22/21 272 lb 6 oz (123.5 kg)    Physical Exam Vitals and nursing note reviewed.  Constitutional:      General: He is not in acute distress.    Appearance: Normal appearance. He is not ill-appearing or diaphoretic.  HENT:     Head: Normocephalic and atraumatic.     Mouth/Throat:     Pharynx: No oropharyngeal exudate.  Eyes:     Conjunctiva/sclera: Conjunctivae normal.     Pupils: Pupils are equal, round, and reactive to light.  Cardiovascular:     Rate and Rhythm: Normal rate and regular rhythm.     Heart sounds: No murmur heard.   No friction rub. No gallop.  Pulmonary:     Effort: No respiratory distress.     Breath sounds: No stridor. No wheezing or rhonchi.  Chest:     Chest wall: No tenderness.  Abdominal:     General: Abdomen is flat. Bowel sounds are normal.     Palpations: Abdomen is soft. There is no mass.     Tenderness: There is no abdominal tenderness.  Musculoskeletal:     Cervical back: Normal range of motion and neck supple. No rigidity or tenderness.     Left lower leg: No edema.  Skin:    General: Skin is warm and dry.  Neurological:     Mental Status: He is alert.    Results for orders placed or performed in visit on 03/11/21  POCT INR  Result Value Ref Range   INR 3.4 (A) 2.0 - 3.0        Current Outpatient  Medications:    allopurinol (ZYLOPRIM) 300 MG tablet, Take 1 tablet (300 mg total) by mouth daily. Needs appt for further refills, Disp: 90 tablet, Rfl: 2   benazepril (LOTENSIN) 40 MG tablet, Take 40 mg by mouth daily., Disp: , Rfl:    carvedilol (COREG) 6.25 MG tablet, Take 1 tablet (6.25 mg total) by mouth 2 (two) times daily., Disp: 180 tablet, Rfl: 3   Flaxseed, Linseed, (FLAXSEED OIL) 1000 MG CAPS, Take 1,000 mg by mouth daily. , Disp: , Rfl:    meloxicam (MOBIC) 15 MG tablet, Take 15 mg by mouth daily as needed for pain., Disp: , Rfl:    warfarin (COUMADIN) 5 MG tablet, Take 1 tablet (5 mg total) by mouth daily., Disp: 30 tablet, Rfl: 1   benazepril (LOTENSIN) 20 MG tablet, Take 20 mg by mouth daily. (Patient not taking: Reported on 03/11/2021), Disp: , Rfl:     Assessment & Plan:  Afib:  couldn't afford xarelto , switched to warfarin continue dosing per coumadin clinic Is on warfarin  for anticoagulation Inr 3.4 today checked at coumadin clinic.  Stable.  Rate control is on coreg and benazapril for bp control.  Fu and mx per cards.    2. HTN : wnl today  Continue current meds.  Medication compliance emphasised. pt advised to keep Bp logs. Pt verbalised understanding of the same. Pt to have a low salt diet . Exercise to reach a goal of at least 150 mins a week.  lifestyle modifications explained and pt understands importance of the above.    Problem List Items Addressed This Visit   None   No orders of the defined types were placed in this encounter.    No orders of the defined types were placed in this encounter.    Follow up plan: Return in about 3 months (around 06/09/2021).

## 2021-03-25 ENCOUNTER — Ambulatory Visit (INDEPENDENT_AMBULATORY_CARE_PROVIDER_SITE_OTHER): Payer: Medicare Other

## 2021-03-25 ENCOUNTER — Other Ambulatory Visit: Payer: Self-pay

## 2021-03-25 DIAGNOSIS — Z7901 Long term (current) use of anticoagulants: Secondary | ICD-10-CM | POA: Diagnosis not present

## 2021-03-25 DIAGNOSIS — I482 Chronic atrial fibrillation, unspecified: Secondary | ICD-10-CM

## 2021-03-25 DIAGNOSIS — Z5181 Encounter for therapeutic drug level monitoring: Secondary | ICD-10-CM

## 2021-03-25 LAB — POCT INR: INR: 1.9 — AB (ref 2.0–3.0)

## 2021-03-25 NOTE — Patient Instructions (Signed)
-   take extra 1/2 tablet warfarin tonight, then - continue dosage 1 tablet every day EXCEPT 1/2 TABLET ON MONDAYS AND FRIDAYS. - recheck INR 2 weeks

## 2021-04-08 ENCOUNTER — Ambulatory Visit (INDEPENDENT_AMBULATORY_CARE_PROVIDER_SITE_OTHER): Payer: Medicare Other

## 2021-04-08 ENCOUNTER — Other Ambulatory Visit: Payer: Self-pay

## 2021-04-08 DIAGNOSIS — Z7901 Long term (current) use of anticoagulants: Secondary | ICD-10-CM

## 2021-04-08 DIAGNOSIS — Z5181 Encounter for therapeutic drug level monitoring: Secondary | ICD-10-CM | POA: Diagnosis not present

## 2021-04-08 DIAGNOSIS — I482 Chronic atrial fibrillation, unspecified: Secondary | ICD-10-CM | POA: Diagnosis not present

## 2021-04-08 LAB — POCT INR: INR: 2.8 (ref 2.0–3.0)

## 2021-04-08 MED ORDER — WARFARIN SODIUM 5 MG PO TABS: 5.0000 mg | ORAL_TABLET | Freq: Every day | ORAL | 1 refills | Status: DC

## 2021-04-08 NOTE — Patient Instructions (Signed)
-   continue dosage 1 tablet every day EXCEPT 1/2 TABLET ON MONDAYS AND FRIDAYS. - recheck INR 4 weeks

## 2021-05-13 ENCOUNTER — Other Ambulatory Visit: Payer: Self-pay

## 2021-05-13 ENCOUNTER — Ambulatory Visit (INDEPENDENT_AMBULATORY_CARE_PROVIDER_SITE_OTHER): Payer: Medicare Other

## 2021-05-13 DIAGNOSIS — Z5181 Encounter for therapeutic drug level monitoring: Secondary | ICD-10-CM | POA: Diagnosis not present

## 2021-05-13 DIAGNOSIS — Z7901 Long term (current) use of anticoagulants: Secondary | ICD-10-CM | POA: Diagnosis not present

## 2021-05-13 DIAGNOSIS — I482 Chronic atrial fibrillation, unspecified: Secondary | ICD-10-CM | POA: Diagnosis not present

## 2021-05-13 LAB — POCT INR: INR: 2.6 (ref 2.0–3.0)

## 2021-05-13 MED ORDER — WARFARIN SODIUM 5 MG PO TABS: 5.0000 mg | ORAL_TABLET | Freq: Every day | ORAL | 1 refills | Status: DC

## 2021-05-13 NOTE — Patient Instructions (Signed)
-   continue dosage 1 tablet every day EXCEPT 1/2 TABLET ON MONDAYS AND FRIDAYS. - recheck INR 5 weeks

## 2021-05-14 ENCOUNTER — Inpatient Hospital Stay: Payer: Medicare Other | Admitting: Anesthesiology

## 2021-05-14 ENCOUNTER — Other Ambulatory Visit: Payer: Self-pay

## 2021-05-14 ENCOUNTER — Emergency Department: Payer: Medicare Other

## 2021-05-14 ENCOUNTER — Observation Stay
Admission: EM | Admit: 2021-05-14 | Discharge: 2021-05-15 | Disposition: A | Discharge status: Home / Self Care | Payer: Medicare Other | Attending: Hospitalist | Admitting: Hospitalist

## 2021-05-14 ENCOUNTER — Encounter
Admission: EM | Disposition: A | Discharge status: Home / Self Care | Payer: Self-pay | Source: Home / Self Care | Attending: Student in an Organized Health Care Education/Training Program

## 2021-05-14 ENCOUNTER — Inpatient Hospital Stay: Payer: Medicare Other

## 2021-05-14 DIAGNOSIS — Z79899 Other long term (current) drug therapy: Secondary | ICD-10-CM | POA: Diagnosis not present

## 2021-05-14 DIAGNOSIS — I10 Essential (primary) hypertension: Secondary | ICD-10-CM | POA: Diagnosis not present

## 2021-05-14 DIAGNOSIS — I959 Hypotension, unspecified: Secondary | ICD-10-CM | POA: Diagnosis not present

## 2021-05-14 DIAGNOSIS — K7460 Unspecified cirrhosis of liver: Secondary | ICD-10-CM

## 2021-05-14 DIAGNOSIS — F17210 Nicotine dependence, cigarettes, uncomplicated: Secondary | ICD-10-CM | POA: Insufficient documentation

## 2021-05-14 DIAGNOSIS — R079 Chest pain, unspecified: Secondary | ICD-10-CM

## 2021-05-14 DIAGNOSIS — K269 Duodenal ulcer, unspecified as acute or chronic, without hemorrhage or perforation: Principal | ICD-10-CM | POA: Insufficient documentation

## 2021-05-14 DIAGNOSIS — Z7901 Long term (current) use of anticoagulants: Secondary | ICD-10-CM | POA: Diagnosis not present

## 2021-05-14 DIAGNOSIS — K259 Gastric ulcer, unspecified as acute or chronic, without hemorrhage or perforation: Secondary | ICD-10-CM | POA: Diagnosis not present

## 2021-05-14 DIAGNOSIS — K2970 Gastritis, unspecified, without bleeding: Secondary | ICD-10-CM | POA: Diagnosis not present

## 2021-05-14 DIAGNOSIS — R739 Hyperglycemia, unspecified: Secondary | ICD-10-CM | POA: Insufficient documentation

## 2021-05-14 DIAGNOSIS — D62 Acute posthemorrhagic anemia: Secondary | ICD-10-CM | POA: Insufficient documentation

## 2021-05-14 DIAGNOSIS — K2289 Other specified disease of esophagus: Secondary | ICD-10-CM | POA: Diagnosis not present

## 2021-05-14 DIAGNOSIS — Z20822 Contact with and (suspected) exposure to covid-19: Secondary | ICD-10-CM | POA: Insufficient documentation

## 2021-05-14 DIAGNOSIS — D72829 Elevated white blood cell count, unspecified: Secondary | ICD-10-CM | POA: Diagnosis not present

## 2021-05-14 DIAGNOSIS — K625 Hemorrhage of anus and rectum: Secondary | ICD-10-CM

## 2021-05-14 DIAGNOSIS — K922 Gastrointestinal hemorrhage, unspecified: Secondary | ICD-10-CM

## 2021-05-14 DIAGNOSIS — R5381 Other malaise: Secondary | ICD-10-CM | POA: Diagnosis present

## 2021-05-14 HISTORY — DX: Gastrointestinal hemorrhage, unspecified: K92.2

## 2021-05-14 HISTORY — PX: ESOPHAGOGASTRODUODENOSCOPY (EGD) WITH PROPOFOL: SHX5813

## 2021-05-14 HISTORY — DX: Unspecified atrial fibrillation: I48.91

## 2021-05-14 LAB — HEPATIC FUNCTION PANEL
ALT: 22 U/L (ref 0–44)
AST: 17 U/L (ref 15–41)
Albumin: 3.6 g/dL (ref 3.5–5.0)
Alkaline Phosphatase: 28 U/L — ABNORMAL LOW (ref 38–126)
Bilirubin, Direct: 0.1 mg/dL (ref 0.0–0.2)
Indirect Bilirubin: 0.7 mg/dL (ref 0.3–0.9)
Total Bilirubin: 0.8 mg/dL (ref 0.3–1.2)
Total Protein: 6.3 g/dL — ABNORMAL LOW (ref 6.5–8.1)

## 2021-05-14 LAB — PREPARE RBC (CROSSMATCH)

## 2021-05-14 LAB — ABO/RH: ABO/RH(D): O POS

## 2021-05-14 LAB — CBC
HCT: 34.7 % — ABNORMAL LOW (ref 39.0–52.0)
HCT: 34 % — ABNORMAL LOW (ref 39.0–52.0)
Hemoglobin: 11.4 g/dL — ABNORMAL LOW (ref 13.0–17.0)
Hemoglobin: 11.3 g/dL — ABNORMAL LOW (ref 13.0–17.0)
MCH: 32.2 pg (ref 26.0–34.0)
MCH: 31.7 pg (ref 26.0–34.0)
MCHC: 32.9 g/dL (ref 30.0–36.0)
MCHC: 33.2 g/dL (ref 30.0–36.0)
MCV: 98 fL (ref 80.0–100.0)
MCV: 95.5 fL (ref 80.0–100.0)
Platelets: 245 10*3/uL (ref 150–400)
Platelets: 187 10*3/uL (ref 150–400)
RBC: 3.54 MIL/uL — ABNORMAL LOW (ref 4.22–5.81)
RBC: 3.56 MIL/uL — ABNORMAL LOW (ref 4.22–5.81)
RDW: 13.9 % (ref 11.5–15.5)
RDW: 14.3 % (ref 11.5–15.5)
WBC: 12.7 10*3/uL — ABNORMAL HIGH (ref 4.0–10.5)
WBC: 10.1 10*3/uL (ref 4.0–10.5)
nRBC: 0 % (ref 0.0–0.2)
nRBC: 0 % (ref 0.0–0.2)

## 2021-05-14 LAB — BASIC METABOLIC PANEL
Anion gap: 7 (ref 5–15)
BUN: 71 mg/dL — ABNORMAL HIGH (ref 8–23)
CO2: 20 mmol/L — ABNORMAL LOW (ref 22–32)
Calcium: 8.7 mg/dL — ABNORMAL LOW (ref 8.9–10.3)
Chloride: 105 mmol/L (ref 98–111)
Creatinine, Ser: 1.06 mg/dL (ref 0.61–1.24)
GFR, Estimated: >60 mL/min (ref >60)
Glucose, Bld: 137 mg/dL — ABNORMAL HIGH (ref 70–99)
Potassium: 4.9 mmol/L (ref 3.5–5.1)
Sodium: 132 mmol/L — ABNORMAL LOW (ref 135–145)

## 2021-05-14 LAB — GLUCOSE, CAPILLARY: Glucose-Capillary: 103 mg/dL — ABNORMAL HIGH (ref 70–99)

## 2021-05-14 LAB — TROPONIN I (HIGH SENSITIVITY)
Troponin I (High Sensitivity): 11 ng/L (ref <18)
Troponin I (High Sensitivity): 11 ng/L (ref <18)

## 2021-05-14 LAB — LACTIC ACID, PLASMA
Lactic Acid, Venous: 2.7 mmol/L (ref 0.5–1.9)
Lactic Acid, Venous: 1.5 mmol/L (ref 0.5–1.9)

## 2021-05-14 LAB — HEMOGLOBIN AND HEMATOCRIT, BLOOD
HCT: 35.7 % — ABNORMAL LOW (ref 39.0–52.0)
Hemoglobin: 12.2 g/dL — ABNORMAL LOW (ref 13.0–17.0)

## 2021-05-14 LAB — PROCALCITONIN: Procalcitonin: <0.1 ng/mL

## 2021-05-14 LAB — PROTIME-INR
INR: 2.5 — ABNORMAL HIGH (ref 0.8–1.2)
INR: 1.4 — ABNORMAL HIGH (ref 0.8–1.2)
Prothrombin Time: 27.1 seconds — ABNORMAL HIGH (ref 11.4–15.2)
Prothrombin Time: 17 seconds — ABNORMAL HIGH (ref 11.4–15.2)

## 2021-05-14 LAB — HIV ANTIBODY (ROUTINE TESTING W REFLEX): HIV Screen 4th Generation wRfx: NONREACTIVE

## 2021-05-14 LAB — RESP PANEL BY RT-PCR (FLU A&B, COVID) ARPGX2
Influenza A by PCR: NEGATIVE
Influenza B by PCR: NEGATIVE
SARS Coronavirus 2 by RT PCR: NEGATIVE

## 2021-05-14 LAB — CBG MONITORING, ED: Glucose-Capillary: 129 mg/dL — ABNORMAL HIGH (ref 70–99)

## 2021-05-14 LAB — APTT: aPTT: 30 seconds (ref 24–36)

## 2021-05-14 SURGERY — ESOPHAGOGASTRODUODENOSCOPY (EGD) WITH PROPOFOL
Anesthesia: General

## 2021-05-14 MED ORDER — LIDOCAINE HCL (CARDIAC) PF 100 MG/5ML IV SOSY
PREFILLED_SYRINGE | INTRAVENOUS | Status: DC | PRN
  Administered 2021-05-14: 100 mg via INTRAVENOUS

## 2021-05-14 MED ORDER — PANTOPRAZOLE SODIUM 40 MG IV SOLR
40.0000 mg | Freq: Two times a day (BID) | INTRAVENOUS | Status: DC
  Administered 2021-05-14 – 2021-05-15 (×2): 40 mg via INTRAVENOUS
  Filled 2021-05-14 (×2): qty 10

## 2021-05-14 MED ORDER — PANTOPRAZOLE INFUSION (NEW) - SIMPLE MED
8.0000 mg/h | INTRAVENOUS | Status: DC
Start: 2021-05-14 — End: 2021-05-14
  Administered 2021-05-14 (×2): 8 mg/h via INTRAVENOUS
  Filled 2021-05-14: qty 100

## 2021-05-14 MED ORDER — EPHEDRINE SULFATE (PRESSORS) 50 MG/ML IJ SOLN
INTRAMUSCULAR | Status: DC | PRN
  Administered 2021-05-14: 10 mg via INTRAVENOUS

## 2021-05-14 MED ORDER — SODIUM CHLORIDE 0.9 % IV SOLN
10.0000 mL/h | Freq: Once | INTRAVENOUS | Status: AC
  Administered 2021-05-14: 10 mL/h via INTRAVENOUS

## 2021-05-14 MED ORDER — PANTOPRAZOLE SODIUM 40 MG IV SOLR: 40.0000 mg | Freq: Two times a day (BID) | INTRAVENOUS | Status: DC

## 2021-05-14 MED ORDER — THIAMINE HCL 100 MG PO TABS: 100.0000 mg | ORAL_TABLET | Freq: Every day | ORAL | Status: DC

## 2021-05-14 MED ORDER — FOLIC ACID 1 MG PO TABS
1.0000 mg | ORAL_TABLET | Freq: Every day | ORAL | Status: DC
  Administered 2021-05-15: 1 mg via ORAL
  Filled 2021-05-14: qty 1

## 2021-05-14 MED ORDER — PHENYLEPHRINE HCL-NACL 20-0.9 MG/250ML-% IV SOLN
INTRAVENOUS | Status: AC
  Filled 2021-05-14: qty 250

## 2021-05-14 MED ORDER — DIAZEPAM 5 MG/ML IJ SOLN
2.5000 mg | Freq: Four times a day (QID) | INTRAMUSCULAR | Status: DC
  Administered 2021-05-14 – 2021-05-15 (×3): 2.5 mg via INTRAVENOUS
  Filled 2021-05-14 (×4): qty 2

## 2021-05-14 MED ORDER — PHENYLEPHRINE HCL (PRESSORS) 10 MG/ML IV SOLN
INTRAVENOUS | Status: DC | PRN
  Administered 2021-05-14 (×2): 100 ug via INTRAVENOUS

## 2021-05-14 MED ORDER — LIDOCAINE HCL (PF) 2 % IJ SOLN
INTRAMUSCULAR | Status: AC
  Filled 2021-05-14: qty 5

## 2021-05-14 MED ORDER — SODIUM CHLORIDE 0.9 % IV SOLN: INTRAVENOUS | Status: DC

## 2021-05-14 MED ORDER — THIAMINE HCL 100 MG/ML IJ SOLN: 100.0000 mg | Freq: Every day | INTRAMUSCULAR | Status: DC

## 2021-05-14 MED ORDER — SODIUM CHLORIDE 0.9 % IV BOLUS
1000.0000 mL | Freq: Once | INTRAVENOUS | Status: AC
  Administered 2021-05-14: 1000 mL via INTRAVENOUS

## 2021-05-14 MED ORDER — EPHEDRINE 5 MG/ML INJ
INTRAVENOUS | Status: AC
  Filled 2021-05-14: qty 5

## 2021-05-14 MED ORDER — PROPOFOL 10 MG/ML IV BOLUS
INTRAVENOUS | Status: AC
  Filled 2021-05-14: qty 20

## 2021-05-14 MED ORDER — PANTOPRAZOLE 80MG IVPB - SIMPLE MED
80.0000 mg | Freq: Once | INTRAVENOUS | Status: AC
  Administered 2021-05-14: 80 mg via INTRAVENOUS
  Filled 2021-05-14: qty 100

## 2021-05-14 MED ORDER — PROPOFOL 10 MG/ML IV BOLUS
INTRAVENOUS | Status: DC | PRN
Start: 2021-05-14 — End: 2021-05-14
  Administered 2021-05-14 (×5): 20 mg via INTRAVENOUS
  Administered 2021-05-14: 150 mg via INTRAVENOUS

## 2021-05-14 MED ORDER — OCTREOTIDE LOAD VIA INFUSION
50.0000 ug | Freq: Once | INTRAVENOUS | Status: AC
  Administered 2021-05-14: 50 ug via INTRAVENOUS
  Filled 2021-05-14: qty 25

## 2021-05-14 MED ORDER — LORAZEPAM 1 MG PO TABS: 1.0000 mg | ORAL_TABLET | ORAL | Status: DC | PRN

## 2021-05-14 MED ORDER — CHLORHEXIDINE GLUCONATE CLOTH 2 % EX PADS: 6.0000 | MEDICATED_PAD | Freq: Every day | CUTANEOUS | Status: DC

## 2021-05-14 MED ORDER — SODIUM CHLORIDE 0.9 % IV SOLN
2.0000 g | INTRAVENOUS | Status: DC
  Administered 2021-05-14 – 2021-05-15 (×2): 2 g via INTRAVENOUS
  Filled 2021-05-14: qty 2
  Filled 2021-05-14 (×2): qty 20

## 2021-05-14 MED ORDER — PROTHROMBIN COMPLEX CONC HUMAN 500 UNITS IV KIT
1635.0000 [IU] | PACK | Status: AC
  Administered 2021-05-14: 1635 [IU] via INTRAVENOUS
  Filled 2021-05-14: qty 1109

## 2021-05-14 MED ORDER — DOCUSATE SODIUM 100 MG PO CAPS: 100.0000 mg | ORAL_CAPSULE | Freq: Two times a day (BID) | ORAL | Status: DC | PRN

## 2021-05-14 MED ORDER — POLYETHYLENE GLYCOL 3350 17 G PO PACK: 17.0000 g | PACK | Freq: Every day | ORAL | Status: DC | PRN

## 2021-05-14 MED ORDER — DEXMEDETOMIDINE HCL IN NACL 400 MCG/100ML IV SOLN: 0.2000 ug/kg/h | INTRAVENOUS | Status: DC

## 2021-05-14 MED ORDER — SODIUM CHLORIDE 0.9 % IV SOLN
50.0000 ug/h | INTRAVENOUS | Status: DC
  Administered 2021-05-14 (×2): 50 ug/h via INTRAVENOUS
  Filled 2021-05-14 (×3): qty 1

## 2021-05-14 MED ORDER — THIAMINE HCL 100 MG/ML IJ SOLN
500.0000 mg | Freq: Every day | INTRAVENOUS | Status: DC
  Administered 2021-05-15: 500 mg via INTRAVENOUS
  Filled 2021-05-14 (×3): qty 5

## 2021-05-14 MED ORDER — VITAMIN K1 10 MG/ML IJ SOLN
10.0000 mg | INTRAVENOUS | Status: DC
  Filled 2021-05-14: qty 1

## 2021-05-14 MED ORDER — ADULT MULTIVITAMIN W/MINERALS CH: 1.0000 | ORAL_TABLET | Freq: Every day | ORAL | Status: DC

## 2021-05-14 MED ORDER — THIAMINE HCL 100 MG/ML IJ SOLN: 100.0000 mg | INTRAMUSCULAR | Status: DC

## 2021-05-14 MED ORDER — LORAZEPAM 2 MG/ML IJ SOLN: 1.0000 mg | INTRAMUSCULAR | Status: DC | PRN

## 2021-05-14 NOTE — ED Notes (Signed)
Second bag of emergency release blood started and verified by [facility] RN

## 2021-05-14 NOTE — Transfer of Care (Signed)
Immediate Anesthesia Transfer of Care Note  Patient: Travis Diaz  Procedure(s) Performed: ESOPHAGOGASTRODUODENOSCOPY (EGD) WITH PROPOFOL  Patient Location: PACU  Anesthesia Type:General  Level of Consciousness: awake  Airway & Oxygen Therapy: Patient Spontanous Breathing  Post-op Assessment: Report given to RN and Post -op Vital signs reviewed and stable  Post vital signs: Reviewed and stable  Last Vitals:  Vitals Value Taken Time  BP 116/78 05/14/21 1339  Temp 35.6 C 05/14/21 1339  Pulse 93 05/14/21 1343  Resp 26 05/14/21 1343  SpO2 98 % 05/14/21 1343  Vitals shown include unvalidated device data.  Last Pain:  Vitals:   05/14/21 1339  TempSrc: Temporal  PainSc: 0-No pain         Complications: No notable events documented.

## 2021-05-14 NOTE — Interval H&P Note (Signed)
History and Physical Interval Note: Preprocedure H&P from 05/14/21  was reviewed and there was no interval change after seeing and examining the patient.  Written consent was obtained from the patient after discussion of risks, benefits, and alternatives. Patient has consented to proceed with Esophagogastroduodenoscopy with possible intervention   05/14/2021 1:11 PM  Travis Diaz  has presented today for surgery, with the diagnosis of GI bleed.  The various methods of treatment have been discussed with the patient and family. After consideration of risks, benefits and other options for treatment, the patient has consented to  Procedure(s): ESOPHAGOGASTRODUODENOSCOPY (EGD) WITH PROPOFOL (N/A) as a surgical intervention.  The patient's history has been reviewed, patient examined, no change in status, stable for surgery.  I have reviewed the patient's chart and labs.  Questions were answered to the patient's satisfaction.     [name]

## 2021-05-14 NOTE — ED Notes (Signed)
Pharmacy contacted about IVPB Kcentra.

## 2021-05-14 NOTE — ED Notes (Signed)
Emergency blood started and verified with Denton Ar, RN.

## 2021-05-14 NOTE — ED Triage Notes (Signed)
Pt comes with c/o some CP at right upper. Pt states he is on blood thinners. Pt states some tunnel vision, dizziness, dark colored stools for 4 days.   Pt states some SOB and weakness.

## 2021-05-14 NOTE — Anesthesia Preprocedure Evaluation (Signed)
Anesthesia Evaluation  Patient identified by MRN, date of birth, ID band Patient awake    Reviewed: Allergy & Precautions, NPO status , Patient's Chart, lab work & pertinent test results  History of Anesthesia Complications Negative for: history of anesthetic complications  Airway Mallampati: III  TM Distance: <3 FB Neck ROM: full    Dental  (+) Chipped, Poor Dentition, Missing   Pulmonary neg shortness of breath, former smoker,    Pulmonary exam normal        Cardiovascular Exercise Tolerance: Good hypertension, (-) angina(-) Past MI + dysrhythmias Atrial Fibrillation      Neuro/Psych negative neurological ROS  negative psych ROS   GI/Hepatic negative GI ROS, Neg liver ROS, neg GERD  ,  Endo/Other  negative endocrine ROS  Renal/GU negative Renal ROS  negative genitourinary   Musculoskeletal  (+) Arthritis ,   Abdominal   Peds  Hematology negative hematology ROS (+)   Anesthesia Other Findings Patient is NPO appropriate and reports no nausea or vomiting today.  Past Medical History: No date: A-fib [facility]) No date: Gout No date: Hypertension  Past Surgical History: No date: broken arm 05/14/2016: COLONOSCOPY WITH PROPOFOL; N/A     Comment:  Procedure: COLONOSCOPY WITH PROPOFOL;  Surgeon: Manya Silvas, MD;  Location: [facility] ENDOSCOPY;  Service:               Endoscopy;  Laterality: N/A; No date: upper leg procedure; Right     Comment:  removal of blood clots  BMI    Body Mass Index: 26.26 kg/m      Reproductive/Obstetrics negative OB ROS                             Anesthesia Physical Anesthesia Plan  ASA: 3  Anesthesia Plan: General   Post-op Pain Management:    Induction: Intravenous  PONV Risk Score and Plan: Propofol infusion and TIVA  Airway Management Planned: Natural Airway and Nasal Cannula  Additional Equipment:   Intra-op Plan:    Post-operative Plan:   Informed Consent: I have reviewed the patients History and Physical, chart, labs and discussed the procedure including the risks, benefits and alternatives for the proposed anesthesia with the patient or authorized representative who has indicated his/her understanding and acceptance.     Dental Advisory Given  Plan Discussed with: Anesthesiologist, CRNA and Surgeon  Anesthesia Plan Comments: (Patient consented for risks of anesthesia including but not limited to:  - adverse reactions to medications - risk of airway placement if required - damage to eyes, teeth, lips or other oral mucosa - nerve damage due to positioning  - sore throat or hoarseness - Damage to heart, brain, nerves, lungs, other parts of body or loss of life  Patient voiced understanding.)        Anesthesia Quick Evaluation

## 2021-05-14 NOTE — H&P (Signed)
NAME:  Travis Diaz, MRN:  [id number], DOB:  [birth year], LOS: 0 ADMISSION DATE:  05/14/2021, CONSULTATION DATE:  05/14/21 REFERRING MD:  Dr. Quentin Cornwall, CHIEF COMPLAINT:  Melena, Dyspnea on exertion, Near Syncope   Brief Pt Description / Synopsis:  66 y.o. Male with a PMH significant for ETOH abuse and Atrial fibrillation on Coumadin admitted with Symptomatic Anemia in the setting Acute Upper GI Bleed. EGD 2/16 with 2 non-bleeding gastric ulcers, gastritis, duodenal erosions without bleeding.  High risk for development of DT's.  History of Present Illness:  Travis Diaz is a 66 y.o. Male with a past medical history significant for Atrial fibrillation on Coumadin, alcohol use, Hypertension, and gout who presented to [facility] ED on 05/14/21 due to complaints of melena, dizziness, dyspnea on exertion, and near syncopal episode.   He reports that he began having dark/black tarry stools approximately 4 days ago starting on Monday. He initially felt it was due to a food bourne illness.  He has since had associated dizziness, dyspnea on exertion, and near syncopal episode this morning.  Denies falling or hitting his head.  He denies abdominal pain, nausea, vomiting, or hematochezia.  Denies any history of known liver disease.  He denies any NSAID use.  He does drink approximately 15 beers vs either 1/4 gallon of vodka per day, on 6 days out of the week.  He also was diagnosed with atrial fib in 12/2020 and started on xarelto at that time, but then transitioned to warfarin 01/2021 due to cost.  ED Course: Initial Vital Signs: Temperature 98.7 F orally, RR 18, HR 67, BP 118/61, SpO2 95% on room air Significant Labs: Sodium 132, CO2 20, Glucose 137, BUN 71, Creatinine 1.06, HS Troponin 11, Lactic acid 2.7, WBC 12.7, PCT <0.10, Hgb 11.4, HcT 34.7, INR 2.5, PT 27.1 Imaging: Chest X-ray>>Enlarged cardiac silhouette with splaying of the mainstem bronchi and double density sign suggesting an enlarged left  atrium. No focal airspace disease. No pleural effusion. No pneumothorax. No acute osseous abnormality. Ossification of the left coracoclavicular ligament. RUQ Abdominal US>>IMPRESSION: Increased hepatic parenchymal echogenicity with a mildly nodular surface contour suggesting cirrhosis. No focal lesions identified. Medications given: Kcentra, Vitamin K 10 mg, 1L NS bolus, 2 units emergent blood, Protonix bolus and gtt, Octreotide bolus and gtt  PCCM was asked to admit due to relative hypotension with potential need for vasopressors.  GI is consulted, and plans for EGD later this afternoon.   Pertinent  Medical History  Atrial Fibrillation on Coumadin ETOH abuse Hypertension Gout  Micro Data:  2/16: SARS-CoV-2 & Influenza PCR>> negative 2/16: Blood culture x2 >>  Antimicrobials:  Ceftriaxone 2/16>>  Significant Hospital Events: Including procedures, antibiotic start and stop dates in addition to other pertinent events   2/16: Presented to ED with acute GI bleed.  PCCM asked to admit due to need for possible vasopressors.  GI consulted, plan for EGD  Interim History / Subjective:  -Pt presented to ED due to melena, dyspnea on exertion, and syncope -BP was low on presentation -BP improving with fluids and emergent blood -PCCM asked to admit due to potential needs for vasopressors -GI consulted, plan for EGD today -Empirically placed on Ceftriaxone for SBP prophylaxis given heavy ETOH history  Objective   Blood pressure (!) 106/51, pulse 86, temperature 97.7 F (36.5 C), resp. rate 18, SpO2 100 %.        Intake/Output Summary (Last 24 hours) at 05/14/2021 1038 Last data filed at 05/14/2021 0937 Gross per 24  hour  Intake 1000 ml  Output --  Net 1000 ml   There were no vitals filed for this visit.  Examination: General: Acutely ill appearing male, sitting in bed, on room air, in NAD HENT: Atraumatic, normocephalic, neck supple, no JVD Lungs: Clear breath sounds  bilaterally, even, nonlabored, normal effort Cardiovascular: Irregularly irregular rhythm, no M/R/G Abdomen: Obese, soft, slightly distended, no guarding or rebound tenderness, BS+ x4 Extremities: Normal bulk and tone, no deformities, no edema Neuro: Awake and alert, oriented x4, follows commands, no focal deficits, speech clear, pupils PERRL GU: deferred  Resolved Hospital Problem list     Assessment & Plan:   Acute Upper GI Bleed Takes Coumadin for A. Fib (INR 2.5 on admission) -NPO -Received Kcentra and Vitamin K ~ follow INR/PT (GOAL INR <= 1.5) -Protonix and Octreotide drips -GI following, appreciate input ~  plan for Endoscopy later today -EGD 2/16 with 2 non-bleeding gastric ulcers, gastritis, duodenal erosions without bleeding -GI Recommendations post EGD: Full liquid diet, NO Aspirin or NSAIDS, Continue IV Protonix 40 mg BID (at discharge BID PO for 8-12 weeks), continue Rocephin 1g q24h for total of 7 days, can d/c Octreotide gtt  Acute Blood Loss Anemia in setting of GI Bleed -Monitor for S/Sx of bleeding -Trend CBC (H&H q6h) -SCD's for VTE Prophylaxis (no chemical prophylaxis given GI bleed) -Transfuse for Hgb <8 -Received 2 units of emergent blood in ED 2/16 ~ follow up H&H post transfusion  Mild Leukocytosis ? SBP in setting of severe ETOH abuse -Monitor fever curve -Trend WBC's & Procalcitonin -Follow cultures as above -Continue empiric Ceftriaxone pending cultures & sensitivities -Abdominal US ~ Increased hepatic parenchymal echogenicity with a mildly nodular surface contour suggesting cirrhosis. No focal lesions identified.  Hypotension in setting of Acute Blood Loss +/- developing Sepsis (? SBP) PMHx: Atrial Fibrillation on Coumadin, Hypertension -Continuous cardiac monitoring -Maintain MAP >65 -IV fluids (received 1L NS bolus) -Transfusions as indicated -Vasopressors as needed to maintain MAP goal ~ currently not requiring -Trend lactic acid until  normalized ( 2.7 ~ 1.5) -HS Troponin negative x2 ( 11 ~ 11) -Echocardiogram from 02/13/21>> LVEF 60-65%, indeterminate diastolic parameters, RV systolic function normal  High risk for development of DT's given ETOH abuse -Provide supportive care -CIWA protocol -Discussed with Dr. Merrilee Jansky, will place on scheduled Valium -Thiamine, Folic acid, MVI  Hyperglycemia -CBG's q4h; Target range of 140 to 180 -SSI -Follow ICU Hypo/Hyperglycemia protocol -Check Hgb A1c   Best Practice (right click and "Reselect all SmartList Selections" daily)   Diet/type: NPO DVT prophylaxis: SCD GI prophylaxis: PPI Lines: N/A Foley:  N/A Code Status:  full code Last date of multidisciplinary goals of care discussion [N/A]  Pt's wife and daughter updated at bedside in ED 2/16.  Labs   CBC: Recent Labs  Lab 05/14/21 0757  WBC 12.7*  HGB 11.4*  HCT 34.7*  MCV 98.0  PLT 810    Basic Metabolic Panel: Recent Labs  Lab 05/14/21 0757  NA 132*  K 4.9  CL 105  CO2 20*  GLUCOSE 137*  BUN 71*  CREATININE 1.06  CALCIUM 8.7*   GFR: CrCl cannot be calculated (Unknown ideal weight.). Recent Labs  Lab 05/14/21 0757 05/14/21 0926  WBC 12.7*  --   LATICACIDVEN  --  2.7*    Liver Function Tests: Recent Labs  Lab 05/14/21 0757  AST 17  ALT 22  ALKPHOS 28*  BILITOT 0.8  PROT 6.3*  ALBUMIN 3.6   No results for input(s): LIPASE,  AMYLASE in the last 168 hours. No results for input(s): AMMONIA in the last 168 hours.  ABG No results found for: PHART, PCO2ART, PO2ART, HCO3, TCO2, ACIDBASEDEF, O2SAT   Coagulation Profile: Recent Labs  Lab 05/13/21 0916 05/14/21 0819  INR 2.6 2.5*    Cardiac Enzymes: No results for input(s): CKTOTAL, CKMB, CKMBINDEX, TROPONINI in the last 168 hours.  HbA1C: HB A1C (BAYER DCA - WAIVED)  Date/Time Value Ref Range Status  10/02/2020 08:04 AM 5.3 <7.0 % Final    Comment:                                          Diabetic Adult            <7.0                                        Healthy Adult        4.3 - 5.7                                                           (DCCT/NGSP) American Diabetes Association's Summary of Glycemic Recommendations for Adults with Diabetes: Hemoglobin A1c <7.0%. More stringent glycemic goals (A1c <6.0%) may further reduce complications at the cost of increased risk of hypoglycemia.    Hgb A1c MFr Bld  Date/Time Value Ref Range Status  02/11/2020 01:09 PM 5.6 4.8 - 5.6 % Final    Comment:             Prediabetes: 5.7 - 6.4          Diabetes: >6.4          Glycemic control for adults with diabetes: <7.0     CBG: Recent Labs  Lab 05/14/21 0830  GLUCAP 129*    Review of Systems:   Positives in BOLD: Gen: Denies fever, chills, weight change, fatigue, night sweats HEENT: Denies blurred vision, double vision, hearing loss, tinnitus, sinus congestion, rhinorrhea, sore throat, neck stiffness, dysphagia PULM: Denies shortness of breath on exertion, cough, sputum production, hemoptysis, wheezing CV: Denies chest pain, edema, orthopnea, paroxysmal nocturnal dyspnea, palpitations GI: Denies abdominal pain, nausea, vomiting, diarrhea, hematochezia, melena, constipation, change in bowel habits GU: Denies dysuria, hematuria, polyuria, oliguria, urethral discharge Endocrine: Denies hot or cold intolerance, polyuria, polyphagia or appetite change Derm: Denies rash, dry skin, scaling or peeling skin change Heme: Denies easy bruising, bleeding, bleeding gums Neuro: Denies dizziness, headache, numbness, weakness, slurred speech, loss of memory or consciousness   Past Medical History:  He,  has a past medical history of A-fib ([place]), Gout, and Hypertension.   Surgical History:   Past Surgical History:  Procedure Laterality Date   broken arm     COLONOSCOPY WITH PROPOFOL N/A 05/14/2016   Procedure: COLONOSCOPY WITH PROPOFOL;  Surgeon: Manya Silvas, MD;  [facility] END[facility]COPY;  Service: Endoscopy;   Laterality: N/A;   upper leg procedure Right    removal of blood clots     Social History:   reports that he quit smoking about 17 years ago. His smoking use included cigarettes. He has never used smokeless  tobacco. He reports current alcohol use of about 24.0 standard drinks per week. He reports that he does not use drugs.   Family History:  His family history includes Cancer in his father and mother; Dementia in his maternal grandmother; Diabetes in his sister; Early death in his father and paternal grandfather; Heart attack in his father, maternal grandfather, paternal grandfather, and paternal grandmother; Heart disease in his father.   Allergies Allergies  Allergen Reactions   Elemental Sulfur Other (See Comments)    Was told by his mother     Home Medications  Prior to Admission medications   Medication Sig Start Date End Date Taking? Authorizing Provider  allopurinol (ZYLOPRIM) 300 MG tablet Take 1 tablet (300 mg total) by mouth daily. Needs appt for further refills 01/08/21  Yes Vigg, Avanti, MD  benazepril (LOTENSIN) 40 MG tablet Take 40 mg by mouth daily. 01/09/21  Yes [provider]  carvedilol (COREG) 6.25 MG tablet Take 1 tablet (6.25 mg total) by mouth 2 (two) times daily. 01/09/21  Yes Arida, Mertie Clause, MD  Flaxseed, Linseed, (FLAXSEED OIL) 1200 MG CAPS Take 1,000 mg by mouth daily.    Yes [provider]  warfarin (COUMADIN) 5 MG tablet Take 1 tablet (5 mg total) by mouth daily. 05/13/21  Yes Wellington Hampshire, MD  benazepril (LOTENSIN) 20 MG tablet Take 20 mg by mouth daily. Patient not taking: Reported on 03/11/2021    [provider]  meloxicam (MOBIC) 15 MG tablet Take 15 mg by mouth daily as needed for pain. Patient not taking: Reported on 05/14/2021    [provider]     Critical care time: 55 minutes     Darel Hong, AGACNP-BC [facility] epic messenger for cross cover needs If after  hours, please call E-link

## 2021-05-14 NOTE — ED Provider Notes (Signed)
[facility] Provider Note    Event Date/Time   First MD Initiated Contact with Patient 05/14/21 0818     (approximate)   History   Chest Pain   HPI  Travis Diaz is a 66 y.o. male history of A-fib on Coumadin presents to the ER for evaluation of generalized malaise black tarry stools for the past 3 days and near syncopal episode this morning.  Patient does have reported heartburn and indigestion over the past several days denies any NSAID use.  No history of ulcer no history of liver disease or varices.  Has had a GI bleed in the past but it was related to hemorrhoid.  Has been compliant with his Coumadin level.  Denies any falls or trauma.  Did not hit his head.     Physical Exam   Triage Vital Signs: ED Triage Vitals  Enc Vitals Group     BP 05/14/21 0752 118/61     Pulse Rate 05/14/21 0752 67     Resp 05/14/21 0752 18     Temp 05/14/21 0752 98.7 F (37.1 C)     Temp Source 05/14/21 0752 Oral     SpO2 05/14/21 0752 95 %     Weight --      Height --      Head Circumference --      Peak Flow --      Pain Score 05/14/21 0754 3     Pain Loc --      Pain Edu? --      Excl. in [place]? --     Most recent vital signs: Vitals:   05/14/21 1045 05/14/21 1100  BP: 107/79 100/65  Pulse: 80 85  Resp: 15 14  Temp:    SpO2: 100% 100%     Constitutional: Alert, diaphoretic and ill appearing Eyes: Conjunctivae are normal.  Head: Atraumatic. Nose: No congestion/rhinnorhea. Mouth/Throat: Mucous membranes are moist.   Neck: Painless ROM.  Cardiovascular:   Good peripheral circulation. Respiratory: Normal respiratory effort.  No retractions.  Gastrointestinal: Soft and nontender in all four quadrants, nontender reducible umbilical hernia  Musculoskeletal:  no deformity Neurologic:  MAE spontaneously. No gross focal neurologic deficits are appreciated.  Skin:  Skin is warm, dry and intact. No rash noted. Psychiatric: Mood and affect are normal.  Speech and behavior are normal.    ED Results / Procedures / Treatments   Labs (all labs ordered are listed, but only abnormal results are displayed) Labs Reviewed  BASIC METABOLIC PANEL - Abnormal; Notable for the following components:      Result Value   Sodium 132 (*)    CO2 20 (*)    Glucose, Bld 137 (*)    BUN 71 (*)    Calcium 8.7 (*)    All other components within normal limits  CBC - Abnormal; Notable for the following components:   WBC 12.7 (*)    RBC 3.54 (*)    Hemoglobin 11.4 (*)    HCT 34.7 (*)    All other components within normal limits  PROTIME-INR - Abnormal; Notable for the following components:   Prothrombin Time 27.1 (*)    INR 2.5 (*)    All other components within normal limits  HEPATIC FUNCTION PANEL - Abnormal; Notable for the following components:   Total Protein 6.3 (*)    Alkaline Phosphatase 28 (*)    All other components within normal limits  LACTIC ACID, PLASMA - Abnormal; Notable for the  following components:   Lactic Acid, Venous 2.7 (*)    All other components within normal limits  CBG MONITORING, ED - Abnormal; Notable for the following components:   Glucose-Capillary 129 (*)    All other components within normal limits  RESP PANEL BY RT-PCR (FLU A&B, COVID) ARPGX2  RESP PANEL BY RT-PCR (FLU A&B, COVID) ARPGX2  CULTURE, BLOOD (ROUTINE X 2)  CULTURE, BLOOD (ROUTINE X 2)  APTT  LACTIC ACID, PLASMA  HEMOGLOBIN AND HEMATOCRIT, BLOOD  HIV ANTIBODY (ROUTINE TESTING W REFLEX)  URINALYSIS, COMPLETE (UACMP) WITH MICROSCOPIC  PROTIME-INR  PROCALCITONIN  CBC  HEMOGLOBIN AND HEMATOCRIT, BLOOD  TYPE AND SCREEN  PREPARE RBC (CROSSMATCH)  ABO/RH  TROPONIN I (HIGH SENSITIVITY)  TROPONIN I (HIGH SENSITIVITY)     EKG  ED ECG REPORT I, Merlyn Lot, the attending physician, personally viewed and interpreted this ECG.   Date: 05/14/2021  EKG Time: 8:12  Rate: 80  Rhythm: afib  Axis: normal  Intervals: poor  r wave progression,   ST&T Change: no stemi, no depression    RADIOLOGY Please see ED Course for my review and interpretation.  I personally reviewed all radiographic images ordered to evaluate for the above acute complaints and reviewed radiology reports and findings.  These findings were personally discussed with the patient.  Please see medical record for radiology report.    PROCEDURES:  Critical Care performed: Yes, see critical care procedure note(s)  .1-3 Lead EKG Interpretation Performed by: Merlyn Lot, MD Authorized by: Merlyn Lot, MD     Interpretation: abnormal     ECG rate:  80   ECG rate assessment: normal     Rhythm: atrial fibrillation   .Critical Care Performed by: Merlyn Lot, MD Authorized by: Merlyn Lot, MD   Critical care provider statement:    Critical care time (minutes):  40   Critical care was necessary to treat or prevent imminent or life-threatening deterioration of the following conditions:  Circulatory failure   Critical care was time spent personally by me on the following activities:  Ordering and performing treatments and interventions, ordering and review of laboratory studies, ordering and review of radiographic studies, pulse oximetry, re-evaluation of patient's condition, review of old charts, obtaining history from patient or surrogate, examination of patient, evaluation of patient's response to treatment, discussions with primary provider, discussions with consultants and development of treatment plan with patient or surrogate   MEDICATIONS ORDERED IN ED: Medications  pantoprozole (PROTONIX) 80 mg /NS 100 mL infusion (8 mg/hr Intravenous New Bag/Given 05/14/21 0928)  pantoprazole (PROTONIX) injection 40 mg (has no administration in time range)  octreotide (SANDOSTATIN) 2 mcg/mL load via infusion 50 mcg (50 mcg Intravenous Bolus from Bag 05/14/21 0932)    And  octreotide (SANDOSTATIN) 500 mcg in sodium chloride 0.9 % 250 mL (2 mcg/mL)  infusion (50 mcg/hr Intravenous New Bag/Given 05/14/21 0932)  0.9 %  sodium chloride infusion (has no administration in time range)  cefTRIAXone (ROCEPHIN) 2 g in sodium chloride 0.9 % 100 mL IVPB (has no administration in time range)  docusate sodium (COLACE) capsule 100 mg (has no administration in time range)  polyethylene glycol (MIRALAX / GLYCOLAX) packet 17 g (has no administration in time range)  folic acid (FOLVITE) tablet 1 mg (1 mg Oral Not Given 05/14/21 1048)  multivitamin with minerals tablet 1 tablet (1 tablet Oral Not Given 05/14/21 1048)  diazepam (VALIUM) injection 2.5 mg (has no administration in time range)  thiamine 500mg  in normal saline (32ml) IVPB (  has no administration in time range)  thiamine (B-1) injection 100 mg (has no administration in time range)  sodium chloride 0.9 % bolus 1,000 mL (0 mLs Intravenous Stopped 05/14/21 0937)  pantoprazole (PROTONIX) 80 mg /NS 100 mL IVPB (0 mg Intravenous Stopped 05/14/21 0924)  0.9 %  sodium chloride infusion (0 mL/hr Intravenous Stopped 05/14/21 0937)  prothrombin complex conc human (KCENTRA) IVPB 1,635 Units (0 Units Intravenous Stopped 05/14/21 1047)     IMPRESSION / MDM / ASSESSMENT AND PLAN / ED COURSE  I reviewed the triage vital signs and the nursing notes.                              Differential diagnosis includes, but is not limited to, ugib, pud, lgib, abla, electrolyte abn, sepsis, acs  Patient arrives critically ill-appearing but stable vital signs.  Did have syncopal event and triage did not fall and hit his head.  Given the melena on exam and reported over the past several days suspect upper GI bleed particularly in the setting of reported indigestion on Coumadin.  Blood work sent with above differential.  Placed on cardiac monitor.  Will be typed and screened.  Will order Protonix IV bolus and infusion.  Patient is going to require hospitalization.   Clinical Course as of 05/14/21 1130  Thu May 14, 2021  0841  Repeat blood pressure is hypotensive I am going to transfuse emergency blood. [PR]  0845 Wife does report patient drinks heavily.  No report of cirrhosis.  Had endoscopy performed back in 2003.  I will also start octreotide.  Will consult GI  [PR]  1027 His INR is elevated 2.5.  I have ordered Kcentra.  Map is improving with IV fluids.  He is receiving blood transfusion. [PR]  2536 I have consulted with Dr. Virgina Jock GI.  I will consult with ICU services I think he needs ICU level of care. [PR]  0934 CXR on my review doesn't show infiltrate or ptx.  Repeat blood pressure improved after volume resuscitation we will continue to monitor.  I have discussed the case in consultation with ICU team who will admit patient to their service. [PR]    Clinical Course User Index [PR] Merlyn Lot, MD     FINAL CLINICAL IMPRESSION(S) / ED DIAGNOSES   Final diagnoses:  Gastrointestinal hemorrhage, unspecified gastrointestinal hemorrhage type     Rx / DC Orders   ED Discharge Orders     None        Note:  This document was prepared using Dragon voice recognition software and may include unintentional dictation errors.    Merlyn Lot, MD 05/14/21 1130

## 2021-05-14 NOTE — Consult Note (Signed)
[place] for Post-Kcentra lab monitoring Indication: GIB / warfarin reversal Labs: Recent Labs    05/14/21 0757 05/14/21 0819 05/14/21 1139  WBC 12.7*  --  10.1  HGB 11.4*  --  11.3*  HCT 34.7*  --  34.0*  PLT 245  --  187  APTT  --  30  --   CREATININE 1.06  --   --   ALBUMIN 3.6  --   --   PROT 6.3*  --   --   AST 17  --   --   ALT 22  --   --   ALKPHOS 28*  --   --   BILITOT 0.8  --   --   BILIDIR 0.1  --   --   IBILI 0.7  --   --    CrCl cannot be calculated (Unknown ideal weight.).   Medications:  Warfarin 5 mg daily (last patient reported dose was 2/16 at 0630) Kcentra 1635 units x 1 on 2/16 (1018 - 1047)  Assessment: Patient is a 66 y/o M with medical history including Afib on warfarin, EtOH abuse, HTN, obesity who presented with symptomatic anemia and melena x 4 days. Suspected GIB in setting of warfarin use and EtOH abuse. GI following and plan is for endoscopy.   Goal of Therapy:  INR <= 1.5  Plan:  --1-hr post Kcentra INR: 1.4 (within goal range) --Will continue to follow post-infusion INR daily x 2 more days  Benita Gutter 05/14/2021,12:37 PM

## 2021-05-14 NOTE — Consult Note (Signed)
GI Inpatient Consult Note  Reason for Consult: GI Bleed   Attending Requesting Consult: Dr. Quentin Cornwall  History of Present Illness: Travis Diaz is a 66 y.o. male seen for evaluation of GI bleed at request of Dr. Quentin Cornwall.   PMHx of atrial fib diagnosed 12/2020 and started on xarelto at that time then transitioned to warfarin 01/2021 due to cost. Also has PMHX of HTN, HLD, gout.  He reports acutely 3 days ago developing issues with black stools. He initially felt like it was due to a food bourne illness but the black stools have persisted throughout the past 3 days. He denies any abdominal pain, nausea, or vomiting with the black stools. He did feel some bloating this week and not as hungry as normal but otherwise denies any specific GI complaints.  He notes he last ate yesterday and tolerated burrito at 4:30 PM without any pain.  He denies any chronic GERD symptoms.  No chronic dysphagia or odynophagia. Was feeling well until acutely 3 days ago.  He denies any NSAID use.  He does drink 15 beers approximately 6 of the 7 days/week.  He does not smoke.  He is no longer on Mobic.  Reports wife reports he had a GI bleed 15 to 20 years ago where he required 3 units of blood, she states this was bright red in etiology and attributed to hemorrhoids but sounds like may have been diverticular. Otherwise no GI bleed hx. Had ginger ale and tea this morning when took coumadin at 6:30am but no food today.  He denies any known history of liver disease.  Wife and daughter at bedside in ER.    Last Colonoscopy: Dr. Flint Melter for personal history of colon polyps total of 14 small polyps throughout the colon with 2 large polyps proximal ascending.  Diverticulosis sigmoid and descending.  Pathology with 6 Hyperplastic polyps, multiple TAs without HGD. 2 ascending polyps TAs w/ larged showing focal superficial high grade dysplasia. 6 month repeat recommended.    Last Endoscopy: none  prior    Past Medical History:  Past Medical History:  Diagnosis Date   A-fib ([place])    Gout    Hypertension     Problem List: Patient Active Problem List   Diagnosis Date Noted   Pure hypercholesterolemia 03/11/2021   Long term (current) use of anticoagulants 02/25/2021   ETOH abuse 01/22/2021   Atrial fibrillation ([place]) 02/12/2020   Obesity 08/10/2019   Arthritis of both knees 01/09/2015   Hypertension 11/27/2014   Acute gout 09/20/2014    Past Surgical History: Past Surgical History:  Procedure Laterality Date   broken arm     COLONOSCOPY WITH PROPOFOL N/A 05/14/2016   Procedure: COLONOSCOPY WITH PROPOFOL;  Surgeon: Manya Silvas, MD;  Location: [facility];  Service: Endoscopy;  Laterality: N/A;   upper leg procedure Right    removal of blood clots    Allergies: Allergies  Allergen Reactions   Elemental Sulfur Other (See Comments)    Was told by his mother    Home Medications: (Not in a hospital admission)  Home medication reconciliation was completed with the patient.   Scheduled Inpatient Medications:    [START ON 05/17/2021] pantoprazole  40 mg Intravenous Q12H    Continuous Inpatient Infusions:    octreotide  (SANDOSTATIN)    IV infusion 50 mcg/hr (05/14/21 0932)   pantoprazole 8 mg/hr (05/14/21 2951)   prothrombin complex conc human (Kcentra) IVPB      PRN Inpatient Medications:  Family History: family history includes Cancer in his father and mother; Dementia in his maternal grandmother; Diabetes in his sister; Early death in his father and paternal grandfather; Heart attack in his father, maternal grandfather, paternal grandfather, and paternal grandmother; Heart disease in his father.    Social History:   reports that he quit smoking about 17 years ago. His smoking use included cigarettes. He has never used smokeless tobacco. He reports current alcohol use of about 24.0 standard drinks per week. He reports that he does not use drugs.    Review of Systems: Constitutional: Weight is stable.  Eyes: No changes in vision. ENT: No oral lesions, sore throat.  GI: see HPI.  Heme/Lymph: No easy bruising.  CV: No chest pain.  GU: No hematuria.  Integumentary: No rashes.  Neuro: No headaches.  Psych: No depression/anxiety.  Endocrine: No heat/cold intolerance.  Allergic/Immunologic: No urticaria.  Resp: No cough, SOB.  Musculoskeletal: No joint swelling.    Physical Examination: BP 120/73    Pulse 80    Temp 97.7 F (36.5 C)    Resp 15    SpO2 100%  Gen: appears acutely ill, shaking.  Receiving unit PRBCs. HEENT: PEERLA, EOMI, Neck: supple, no JVD or thyromegaly Chest: CTA bilaterally, no wheezes, crackles, or other adventitious sounds CV: RRR, no m/g/c/r Abd: obese, soft, NT, ND, +BS in all four quadrants; no HSM, guarding, ridigity, or rebound tenderness Ext: no edema, well perfused with 2+ pulses, Skin: no rash or lesions noted Lymph: no LAD  Data: Lab Results  Component Value Date   WBC 12.7 (H) 05/14/2021   HGB 11.4 (L) 05/14/2021   HCT 34.7 (L) 05/14/2021   MCV 98.0 05/14/2021   PLT 245 05/14/2021   Recent Labs  Lab 05/14/21 0757  HGB 11.4*   Lab Results  Component Value Date   NA 132 (L) 05/14/2021   K 4.9 05/14/2021   CL 105 05/14/2021   CO2 20 (L) 05/14/2021   BUN 71 (H) 05/14/2021   CREATININE 1.06 05/14/2021   Lab Results  Component Value Date   ALT 22 05/14/2021   AST 17 05/14/2021   ALKPHOS 28 (L) 05/14/2021   BILITOT 0.8 05/14/2021   Recent Labs  Lab 05/14/21 0819  INR 2.5*   Last Hgb 7 months ago was 17.0.    Assessment/Plan: Travis Diaz is a 66 y.o. male admitted for GI bleeding  1.  Acute GI bleed-likely upper GI in source with associated melena, increased BUN/creatinine ratio. Strong history of heavy alcohol abuse. His baseline hemoglobin runs between 16 and 17, 11.4 on admission symptomatic w/ weakness, significant hypotension.  He is chronically on warfarin for  atrial fib. He took his last dose of Coumadin at 6:30 this morning, has received Travis Diaz  He has heavy daily alcohol use up to 16 beers or 1/4 gallon of vodka per day.  He denies any known liver disease but has not had liver imaging recently and suspect given heavy alcohol abuse.  Recommendations: 1.  Continue with transfusion PRBC and fluid resuscitation per primary team 2.  Continue Protonix and octreotide drip. 3.  Received Kcentra 4. Plan for endoscopy later today when stable from anesthesia standpoint. 5. Mild leukocytosis - consider blood cultures, etc to exclude infectious source. 6.  Will give 1 gram ceftriaxone IV for potential SBP    I reviewed the risks (including bleeding, perforation, infection, anesthesia complications, cardiac/respiratory complications), benefits and alternatives of EGD. Patient consents to proceed.    Case discussed  w/ Dr. Virgina Jock who will also evaluate patient.  Ezzard Standing, PA-C [facility]

## 2021-05-14 NOTE — Op Note (Signed)
[facility] Gastroenterology Patient Name: Travis Diaz Procedure Date: 05/14/2021 11:43 AM MRN: [id number] Account #: [id number] Date of Birth: 04/24/1955 Admit Type: Inpatient Age: 66 Room: [facility] ENDO ROOM 1 Gender: Male Note Status: Finalized Instrument Name: Upper Endoscope [id number] Procedure:             Upper GI endoscopy Indications:           Melena Providers:             [name] DO, DO Referring MD:          [name] (Referring MD) Medicines:             Monitored Anesthesia Care Complications:         No immediate complications. Estimated blood loss:                         Minimal. Procedure:             Pre-Anesthesia Assessment:                        - Prior to the procedure, a History and Physical was                         performed, and patient medications and allergies were                         reviewed. The patient is competent. The risks and                         benefits of the procedure and the sedation options and                         risks were discussed with the patient. All questions                         were answered and informed consent was obtained.                         Patient identification and proposed procedure were                         verified by the physician, the anesthetist and the                         technician in the endoscopy suite. Mental Status                         Examination: alert and oriented. Airway Examination:                         normal oropharyngeal airway and neck mobility.                         Respiratory Examination: clear to auscultation. CV                         Examination: RRR, no murmurs, no S3 or S4.  Prophylactic Antibiotics: The patient does not require                         prophylactic antibiotics. Prior Anticoagulants: The                         patient has taken no previous anticoagulant or                          antiplatelet agents. ASA Grade Assessment: III - A                         patient with severe systemic disease. After reviewing                         the risks and benefits, the patient was deemed in                         satisfactory condition to undergo the procedure. The                         anesthesia plan was to use monitored anesthesia care                         (MAC). Immediately prior to administration of                         medications, the patient was re-assessed for adequacy                         to receive sedatives. The heart rate, respiratory                         rate, oxygen saturations, blood pressure, adequacy of                         pulmonary ventilation, and response to care were                         monitored throughout the procedure. The physical                         status of the patient was re-assessed after the                         procedure.                        After obtaining informed consent, the endoscope was                         passed under direct vision. Throughout the procedure,                         the patient's blood pressure, pulse, and oxygen                         saturations were monitored continuously. The Endoscope  was introduced through the mouth, and advanced to the                         second part of duodenum. The upper GI endoscopy was                         accomplished without difficulty. The patient tolerated                         the procedure well. Findings:      Multiple localized erosions without bleeding were found in the duodenal       bulb. Estimated blood loss: none.      The exam of the duodenum was otherwise normal.      The Z-line was regular. Estimated blood loss: none.      Esophagogastric landmarks were identified: the gastroesophageal junction       was found at 40 cm from the incisors.      The examined esophagus was normal. No esophageal varices or signs  of       recent bleeding appreciated. Estimated blood loss: none.      One non-bleeding cratered gastric ulcer with a flat pigmented spot       (Forrest Class IIc) was found in the gastric antrum. The lesion was 6 mm       in largest dimension. Estimated blood loss: none.      One non-bleeding cratered gastric ulcer with a clean ulcer base (Forrest       Class III) was found in the gastric antrum. The lesion was 3 mm in       largest dimension. Estimated blood loss: none. Biopsies were taken with       a cold forceps for histology. Estimated blood loss was minimal.      Localized moderate inflammation characterized by erythema was found in       the gastric antrum. Biopsies were taken with a cold forceps for       Helicobacter pylori testing. Estimated blood loss was minimal.      Small amount of old blood was found in the entire examined stomach. No       signs of portal gastropathy, gave, or gastric varices Impression:            - Duodenal erosions without bleeding.                        - Z-line regular.                        - Esophagogastric landmarks identified.                        - Normal esophagus.                        - Non-bleeding gastric ulcer with a flat pigmented                         spot (Forrest Class IIc).                        - Non-bleeding gastric ulcer with a clean ulcer base                         (  Forrest Class III). Biopsied.                        - Gastritis. Biopsied.                        - Small amount of old blood in the entire stomach. Recommendation:        - Return patient to hospital ward for ongoing care.                        - Full liquid diet.                        - No aspirin, ibuprofen, naproxen, or other                         non-steroidal anti-inflammatory drugs.                        - Continue protonix iv 40 mg bid. Would transition                         upon discharge to bid po for 8-12 weeks.                         Continue 1 g ceftriaxone q24 h iv for total 7 days. If                         discharged prior- 530m po bid ciprofloxacin                        Can discontinue octreotide gtt                        CIWA protocol as per primary                        - The findings and recommendations were discussed with                         the patient's family.                        - The findings and recommendations were discussed with                         the patient. Procedure Code(s):     --- Professional ---                        [id number] Esophagogastroduodenoscopy, flexible,                         transoral; with biopsy, single or multiple Diagnosis Code(s):     --- Professional ---                        K26.9, Duodenal ulcer, unspecified as acute or                         chronic, without hemorrhage or perforation  K25.9, Gastric ulcer, unspecified as acute or chronic,                         without hemorrhage or perforation                        K29.70, Gastritis, unspecified, without bleeding                        K92.2, Gastrointestinal hemorrhage, unspecified                        K92.1, Melena (includes Hematochezia) CPT copyright 2019 American Medical Association. All rights reserved. The codes documented in this report are preliminary and upon coder review may  be revised to meet current compliance requirements. Attending Participation:      I personally performed the entire procedure. Volney American, DO [name] DO, DO 05/14/2021 1:51:20 PM This report has been signed electronically. Number of Addenda: 0 Note Initiated On: 05/14/2021 11:43 AM Estimated Blood Loss:  Estimated blood loss was minimal.      [facility]

## 2021-05-14 NOTE — ED Notes (Addendum)
Pt taken to xray 

## 2021-05-14 NOTE — Consult Note (Signed)
[place] for Electrolyte Monitoring and Replacement   Recent Labs: Potassium (mmol/L)  Date Value  05/14/2021 4.9  04/12/2014 3.9   Calcium (mg/dL)  Date Value  05/14/2021 8.7 (L)   Calcium, Total (mg/dL)  Date Value  04/12/2014 8.2 (L)   Albumin (g/dL)  Date Value  05/14/2021 3.6  12/31/2020 4.1   Sodium (mmol/L)  Date Value  05/14/2021 132 (L)  12/31/2020 139  04/12/2014 139   Assessment: Patient is a 66 y/o M with medical history including Afib on warfarin, gout, HTN, obesity, EtOH abuse who is presenting with reports of chest pain, melena x 4 days, SOB, and weakness. Ultimately being admitted for suspected GIB and symptomatic anemia. GI consulted and planning for endoscopy. Patient received a dose of Kcentra for warfarin reversal (INR was 2.5 on presentation). Pharmacy consulted to assist with electrolyte monitoring and replacement as indicated.   Goal of Therapy:  Electrolytes within normal limits  Plan:  --No electrolyte replacement indicated at this time --Follow-up electrolytes with AM labs tomorrow  Benita Gutter 05/14/2021 10:40 AM

## 2021-05-14 NOTE — H&P (View-Only) (Signed)
GI Inpatient Consult Note  Reason for Consult: GI Bleed   Attending Requesting Consult: Dr. Quentin Cornwall  History of Present Illness: Travis Diaz is a 66 y.o. male seen for evaluation of GI bleed at request of Dr. Quentin Cornwall.   PMHx of atrial fib diagnosed 12/2020 and started on xarelto at that time then transitioned to warfarin 01/2021 due to cost. Also has PMHX of HTN, HLD, gout.  He reports acutely 3 days ago developing issues with black stools. He initially felt like it was due to a food bourne illness but the black stools have persisted throughout the past 3 days. He denies any abdominal pain, nausea, or vomiting with the black stools. He did feel some bloating this week and not as hungry as normal but otherwise denies any specific GI complaints.  He notes he last ate yesterday and tolerated burrito at 4:30 PM without any pain.  He denies any chronic GERD symptoms.  No chronic dysphagia or odynophagia. Was feeling well until acutely 3 days ago.  He denies any NSAID use.  He does drink 15 beers approximately 6 of the 7 days/week.  He does not smoke.  He is no longer on Mobic.  Reports wife reports he had a GI bleed 15 to 20 years ago where he required 3 units of blood, she states this was bright red in etiology and attributed to hemorrhoids but sounds like may have been diverticular. Otherwise no GI bleed hx. Had ginger ale and tea this morning when took coumadin at 6:30am but no food today.  He denies any known history of liver disease.  Wife and daughter at bedside in ER.    Last Colonoscopy: Dr. Flint Melter for personal history of colon polyps total of 14 small polyps throughout the colon with 2 large polyps proximal ascending.  Diverticulosis sigmoid and descending.  Pathology with 6 Hyperplastic polyps, multiple TAs without HGD. 2 ascending polyps TAs w/ larged showing focal superficial high grade dysplasia. 6 month repeat recommended.    Last Endoscopy: none  prior    Past Medical History:  Past Medical History:  Diagnosis Date   A-fib ([place])    Gout    Hypertension     Problem List: Patient Active Problem List   Diagnosis Date Noted   Pure hypercholesterolemia 03/11/2021   Long term (current) use of anticoagulants 02/25/2021   ETOH abuse 01/22/2021   Atrial fibrillation ([place]) 02/12/2020   Obesity 08/10/2019   Arthritis of both knees 01/09/2015   Hypertension 11/27/2014   Acute gout 09/20/2014    Past Surgical History: Past Surgical History:  Procedure Laterality Date   broken arm     COLONOSCOPY WITH PROPOFOL N/A 05/14/2016   Procedure: COLONOSCOPY WITH PROPOFOL;  Surgeon: Manya Silvas, MD;  Location: [place];  Service: Endoscopy;  Laterality: N/A;   upper leg procedure Right    removal of blood clots    Allergies: Allergies  Allergen Reactions   Elemental Sulfur Other (See Comments)    Was told by his mother    Home Medications: (Not in a hospital admission)  Home medication reconciliation was completed with the patient.   Scheduled Inpatient Medications:    [START ON 05/17/2021] pantoprazole  40 mg Intravenous Q12H    Continuous Inpatient Infusions:    octreotide  (SANDOSTATIN)    IV infusion 50 mcg/hr (05/14/21 0932)   pantoprazole 8 mg/hr (05/14/21 6578)   prothrombin complex conc human (Kcentra) IVPB      PRN Inpatient Medications:  Family History: family history includes Cancer in his father and mother; Dementia in his maternal grandmother; Diabetes in his sister; Early death in his father and paternal grandfather; Heart attack in his father, maternal grandfather, paternal grandfather, and paternal grandmother; Heart disease in his father.    Social History:   reports that he quit smoking about 17 years ago. His smoking use included cigarettes. He has never used smokeless tobacco. He reports current alcohol use of about 24.0 standard drinks per week. He reports that he does not use drugs.    Review of Systems: Constitutional: Weight is stable.  Eyes: No changes in vision. ENT: No oral lesions, sore throat.  GI: see HPI.  Heme/Lymph: No easy bruising.  CV: No chest pain.  GU: No hematuria.  Integumentary: No rashes.  Neuro: No headaches.  Psych: No depression/anxiety.  Endocrine: No heat/cold intolerance.  Allergic/Immunologic: No urticaria.  Resp: No cough, SOB.  Musculoskeletal: No joint swelling.    Physical Examination: BP 120/73    Pulse 80    Temp 97.7 F (36.5 C)    Resp 15    SpO2 100%  Gen: appears acutely ill, shaking.  Receiving unit PRBCs. HEENT: PEERLA, EOMI, Neck: supple, no JVD or thyromegaly Chest: CTA bilaterally, no wheezes, crackles, or other adventitious sounds CV: RRR, no m/g/c/r Abd: obese, soft, NT, ND, +BS in all four quadrants; no HSM, guarding, ridigity, or rebound tenderness Ext: no edema, well perfused with 2+ pulses, Skin: no rash or lesions noted Lymph: no LAD  Data: Lab Results  Component Value Date   WBC 12.7 (H) 05/14/2021   HGB 11.4 (L) 05/14/2021   HCT 34.7 (L) 05/14/2021   MCV 98.0 05/14/2021   PLT 245 05/14/2021   Recent Labs  Lab 05/14/21 0757  HGB 11.4*   Lab Results  Component Value Date   NA 132 (L) 05/14/2021   K 4.9 05/14/2021   CL 105 05/14/2021   CO2 20 (L) 05/14/2021   BUN 71 (H) 05/14/2021   CREATININE 1.06 05/14/2021   Lab Results  Component Value Date   ALT 22 05/14/2021   AST 17 05/14/2021   ALKPHOS 28 (L) 05/14/2021   BILITOT 0.8 05/14/2021   Recent Labs  Lab 05/14/21 0819  INR 2.5*   Last Hgb 7 months ago was 17.0.    Assessment/Plan: Travis Diaz is a 66 y.o. male admitted for GI bleeding  1.  Acute GI bleed-likely upper GI in source with associated melena, increased BUN/creatinine ratio. Strong history of heavy alcohol abuse. His baseline hemoglobin runs between 16 and 17, 11.4 on admission symptomatic w/ weakness, significant hypotension.  He is chronically on warfarin for  atrial fib. He took his last dose of Coumadin at 6:30 this morning, has received Eppie Gibson  He has heavy daily alcohol use up to 16 beers or 1/4 gallon of vodka per day.  He denies any known liver disease but has not had liver imaging recently and suspect given heavy alcohol abuse.  Recommendations: 1.  Continue with transfusion PRBC and fluid resuscitation per primary team 2.  Continue Protonix and octreotide drip. 3.  Received Kcentra 4. Plan for endoscopy later today when stable from anesthesia standpoint. 5. Mild leukocytosis - consider blood cultures, etc to exclude infectious source. 6.  Will give 1 gram ceftriaxone IV for potential SBP    I reviewed the risks (including bleeding, perforation, infection, anesthesia complications, cardiac/respiratory complications), benefits and alternatives of EGD. Patient consents to proceed.    Case discussed  w/ Dr. Virgina Jock who will also evaluate patient.  Ezzard Standing, PA-C [facility]

## 2021-05-14 NOTE — Progress Notes (Signed)
1528 PATIENT ADMITTED FROM ENDOSCOPY. ALERT AND ORIENTED. P[place]ASANT.

## 2021-05-15 ENCOUNTER — Encounter: Payer: Self-pay | Admitting: Gastroenterology

## 2021-05-15 DIAGNOSIS — K922 Gastrointestinal hemorrhage, unspecified: Secondary | ICD-10-CM | POA: Diagnosis not present

## 2021-05-15 DIAGNOSIS — K269 Duodenal ulcer, unspecified as acute or chronic, without hemorrhage or perforation: Secondary | ICD-10-CM | POA: Diagnosis not present

## 2021-05-15 LAB — BPAM RBC
Blood Product Expiration Date: 202303142359
Blood Product Expiration Date: 202303142359
ISSUE DATE / TIME: 202302160842
ISSUE DATE / TIME: 202302160842
Unit Type and Rh: 5100
Unit Type and Rh: 5100

## 2021-05-15 LAB — TYPE AND SCREEN
ABO/RH(D): O POS
Antibody Screen: NEGATIVE
Unit division: 0
Unit division: 0

## 2021-05-15 LAB — BASIC METABOLIC PANEL
Anion gap: 5 (ref 5–15)
BUN: 52 mg/dL — ABNORMAL HIGH (ref 8–23)
CO2: 24 mmol/L (ref 22–32)
Calcium: 8.1 mg/dL — ABNORMAL LOW (ref 8.9–10.3)
Chloride: 106 mmol/L (ref 98–111)
Creatinine, Ser: 1.21 mg/dL (ref 0.61–1.24)
GFR, Estimated: >60 mL/min (ref >60)
Glucose, Bld: 123 mg/dL — ABNORMAL HIGH (ref 70–99)
Potassium: 4.8 mmol/L (ref 3.5–5.1)
Sodium: 135 mmol/L (ref 135–145)

## 2021-05-15 LAB — PHOSPHORUS: Phosphorus: 3.8 mg/dL (ref 2.5–4.6)

## 2021-05-15 LAB — CBC
HCT: 30.9 % — ABNORMAL LOW (ref 39.0–52.0)
Hemoglobin: 10.3 g/dL — ABNORMAL LOW (ref 13.0–17.0)
MCH: 31.6 pg (ref 26.0–34.0)
MCHC: 33.3 g/dL (ref 30.0–36.0)
MCV: 94.8 fL (ref 80.0–100.0)
Platelets: 172 10*3/uL (ref 150–400)
RBC: 3.26 MIL/uL — ABNORMAL LOW (ref 4.22–5.81)
RDW: 14.7 % (ref 11.5–15.5)
WBC: 10.5 10*3/uL (ref 4.0–10.5)
nRBC: 0 % (ref 0.0–0.2)

## 2021-05-15 LAB — PROCALCITONIN: Procalcitonin: <0.1 ng/mL

## 2021-05-15 LAB — PROTIME-INR
INR: 1.6 — ABNORMAL HIGH (ref 0.8–1.2)
Prothrombin Time: 19.3 seconds — ABNORMAL HIGH (ref 11.4–15.2)

## 2021-05-15 LAB — MAGNESIUM: Magnesium: 2.1 mg/dL (ref 1.7–2.4)

## 2021-05-15 MED ORDER — CIPROFLOXACIN HCL 500 MG PO TABS: 500.0000 mg | ORAL_TABLET | Freq: Two times a day (BID) | ORAL | 0 refills | Status: AC

## 2021-05-15 MED ORDER — FOLIC ACID 1 MG PO TABS: 1.0000 mg | ORAL_TABLET | Freq: Every day | ORAL | Status: DC

## 2021-05-15 MED ORDER — WARFARIN SODIUM 5 MG PO TABS: 5.0000 mg | ORAL_TABLET | Freq: Every day | ORAL | 1 refills | Status: DC

## 2021-05-15 MED ORDER — THIAMINE HCL 100 MG PO TABS: 100.0000 mg | ORAL_TABLET | Freq: Every day | ORAL | Status: DC

## 2021-05-15 MED ORDER — PANTOPRAZOLE SODIUM 40 MG PO TBEC: 40.0000 mg | DELAYED_RELEASE_TABLET | Freq: Two times a day (BID) | ORAL | 0 refills | Status: DC

## 2021-05-15 MED ORDER — ADULT MULTIVITAMIN W/MINERALS CH
1.0000 | ORAL_TABLET | Freq: Every day | ORAL | Status: DC
Start: 2021-05-16 — End: 2021-08-06

## 2021-05-15 NOTE — TOC Initial Note (Signed)
Transition of Care [facility]) - Initial/Assessment Note    Patient Details  Name: Travis Diaz MRN: [id number] Date of Birth: [birth year]  [facility] CM/SW Contact:    Travis Hutching, RN Phone Number: 05/15/2021, 2:56 PM  Clinical Narrative:                 Patient admitted to the hospital with acute GI bleed.  RNCM met with patient and his wife at the bedside, introduced self and explained role.  Patient is from home with his wife, independent and drives.  Patient is current with a PCP.   TOC consult for substance abuse resources.  Patient reports that he does drink alcohol, when asked how much he replies " a lot".  Patient declines resources for SA treatment.   Patient's wife is at the bedside and will transport home today as patient is now medically cleared.    Expected Discharge Plan: Home/Self Care Barriers to Discharge: Barriers Resolved   Patient Goals and CMS Choice Patient states their goals for this hospitalization and ongoing recovery are:: Patient ready to go home      Expected Discharge Plan and Services Expected Discharge Plan: Home/Self Care   Discharge Planning Services: CM Consult   Living arrangements for the past 2 months: Single Family Home Expected Discharge Date: 05/15/21               DME Arranged: N/A DME Agency: NA       HH Arranged: NA [place] Agency: NA        Prior Living Arrangements/Services Living arrangements for the past 2 months: Single Family Home Lives with:: Spouse Patient language and need for interpreter reviewed:: Yes Do you feel safe going back to the place where you live?: Yes      Need for Family Participation in Patient Care: Yes (Comment) Care giver support system in place?: Yes (comment) (wife)   Criminal Activity/Legal Involvement Pertinent to Current Situation/Hospitalization: No - Comment as needed  Activities of Daily Living Home Assistive Devices/Equipment: None ADL Screening (condition at time of  admission) Patient's cognitive ability adequate to safely complete daily activities?: Yes Is the patient deaf or have difficulty hearing?: No Does the patient have difficulty seeing, even when wearing glasses/contacts?: No Does the patient have difficulty concentrating, remembering, or making decisions?: No Patient able to express need for assistance with ADLs?: Yes Does the patient have difficulty dressing or bathing?: No Independently performs ADLs?: Yes (appropriate for developmental age) Does the patient have difficulty walking or climbing stairs?: No Weakness of Legs: None Weakness of Arms/Hands: None  Permission Sought/Granted Permission sought to share information with : Case Manager, Family Supports Permission granted to share information with : Yes, Verbal Permission Granted  Share Information with NAME: Travis Diaz     Permission granted to share info w Relationship: spouse  Permission granted to share info w Contact Information: [phone]  Emotional Assessment Appearance:: Appears stated age Attitude/Demeanor/Rapport: Engaged Affect (typically observed): Accepting Orientation: : Oriented to Self, Oriented to Place, Oriented to  Time, Oriented to Situation Alcohol / Substance Use: Alcohol Use Psych Involvement: No (comment)  Admission diagnosis:  Cirrhosis ([place]) [K74.60] Acute GI bleeding [K92.2] Chest pain [R07.9] Gastrointestinal hemorrhage, unspecified gastrointestinal hemorrhage type [K92.2] Patient Active Problem List   Diagnosis Date Noted   Acute GI bleeding 05/14/2021   Pure hypercholesterolemia 03/11/2021   Long term (current) use of anticoagulants 02/25/2021   ETOH abuse 01/22/2021   Atrial fibrillation ([place]) 02/12/2020  Obesity 08/10/2019   Arthritis of both knees 01/09/2015   Hypertension 11/27/2014   Acute gout 09/20/2014   PCP:  [name], MD Pharmacy:   [facility], [place] - [address]  [place] [address] Phone: [phone] Fax: [phone]     Social Determinants of Health (SDOH) Interventions    Readmission Risk Interventions No flowsheet data found.

## 2021-05-15 NOTE — Plan of Care (Signed)
Continuing with plan of care. 

## 2021-05-15 NOTE — Care Management Obs Status (Signed)
[place] NOTIFICATION   Patient Details  Name: Travis Diaz MRN: [id number] Date of Birth: 1955/04/26   Medicare Observation Status Notification Given:  Yes    Shelbie Hutching, RN 05/15/2021, 2:50 PM

## 2021-05-15 NOTE — Plan of Care (Signed)
Patient discharged home.

## 2021-05-15 NOTE — Care Management CC44 (Signed)
Condition Code 44 Documentation Completed  Patient Details  Name: Travis Diaz MRN: [id number] Date of Birth: 24-Jun-1955   Condition Code 44 given:  Yes Patient signature on Condition Code 44 notice:  Yes Documentation of 2 MD's agreement:  Yes Code 44 added to claim:  Yes    Shelbie Hutching, RN 05/15/2021, 2:50 PM

## 2021-05-15 NOTE — Anesthesia Postprocedure Evaluation (Signed)
Anesthesia Post Note  Patient: Travis Diaz  Procedure(s) Performed: ESOPHAGOGASTRODUODENOSCOPY (EGD) WITH PROPOFOL  Patient location during evaluation: ICU Anesthesia Type: General Level of consciousness: confused Pain management: pain level controlled Vital Signs Assessment: post-procedure vital signs reviewed and stable Respiratory status: spontaneous breathing and respiratory function stable Cardiovascular status: stable Postop Assessment: no apparent nausea or vomiting Anesthetic complications: no   No notable events documented.   Last Vitals:  Vitals:   05/15/21 0358 05/15/21 0400  BP: 122/83 122/83  Pulse: 81 81  Resp: 17 16  Temp:    SpO2: 99% 98%    Last Pain:  Vitals:   05/15/21 0400  TempSrc:   PainSc: 0-No pain                 Lia Foyer

## 2021-05-15 NOTE — Progress Notes (Signed)
Inpatient Follow-up/Progress Note   Patient ID: Travis Diaz is a 66 y.o. male.  Overnight Events / Subjective Findings NAEON. Pt reports no further BM. Hgb did decrease but in setting of resuscitation and equilibration from blood loss. No abdominal pain. Tolerating PO. Wife is at bedside. No other acute gi complaints.  Review of Systems  Constitutional:  Negative for activity change, appetite change, chills, diaphoresis, fatigue, fever and unexpected weight change.  HENT:  Negative for trouble swallowing and voice change.   Respiratory:  Negative for shortness of breath and wheezing.   Cardiovascular:  Negative for chest pain, palpitations and leg swelling.  Gastrointestinal:  Negative for abdominal distention, abdominal pain, anal bleeding, blood in stool, constipation, diarrhea, nausea and vomiting.  Musculoskeletal:  Negative for arthralgias and myalgias.  Skin:  Negative for color change and pallor.  Neurological:  Negative for dizziness, syncope and weakness.  Psychiatric/Behavioral:  Negative for confusion. The patient is not nervous/anxious.   All other systems reviewed and are negative.   Medications  Current Facility-Administered Medications:    cefTRIAXone (ROCEPHIN) 2 g in sodium chloride 0.9 % 100 mL IVPB, 2 g, Intravenous, Q24H, Ezzard Standing, PA-C, Stopped at 05/15/21 1497   Chlorhexidine Gluconate Cloth 2 % PADS 6 each, 6 each, Topical, Daily, Nelle Don, MD   diazepam (VALIUM) injection 2.5 mg, 2.5 mg, Intravenous, Q6H, Nelle Don, MD, 2.5 mg at 05/15/21 1215   docusate sodium (COLACE) capsule 100 mg, 100 mg, Oral, BID PRN, Bradly Bienenstock, NP   folic acid (FOLVITE) tablet 1 mg, 1 mg, Oral, Daily, Darel Hong D, NP, 1 mg at 05/15/21 0263   multivitamin with minerals tablet 1 tablet, 1 tablet, Oral, Daily, Darel Hong D, NP   pantoprazole (PROTONIX) injection 40 mg, 40 mg, Intravenous, Q12H, Nelle Don, MD, 40 mg at 05/15/21 0910    polyethylene glycol (MIRALAX / GLYCOLAX) packet 17 g, 17 g, Oral, Daily PRN, Bradly Bienenstock, NP   [START ON 05/17/2021] thiamine (B-1) injection 100 mg, 100 mg, Intravenous, Q24H, Darel Hong D, NP   thiamine 500mg  in normal saline (81ml) IVPB, 500 mg, Intravenous, Daily, Darel Hong D, NP, Last Rate: 100 mL/hr at 05/15/21 0921, 500 mg at 05/15/21 0921  cefTRIAXone (ROCEPHIN)  IV Stopped (05/15/21 7858)   thiamine injection 500 mg (05/15/21 0921)    docusate sodium, polyethylene glycol   Objective    Vitals:   05/15/21 1200 05/15/21 1300 05/15/21 1400 05/15/21 1500  BP: 123/84 (!) 141/94 (!) 143/84 124/81  Pulse: 82 77 (!) 102 83  Resp: 19 (!) 21 18 18   Temp: 97.9 F (36.6 C)     TempSrc: Oral     SpO2: 98% 91% 100% 97%  Weight:      Height:         Physical Exam Vitals and nursing note reviewed.  Constitutional:      General: He is not in acute distress.    Appearance: Normal appearance. He is not ill-appearing, toxic-appearing or diaphoretic.  HENT:     Head: Normocephalic and atraumatic.     Nose: Nose normal.     Mouth/Throat:     Mouth: Mucous membranes are moist.     Pharynx: Oropharynx is clear.  Eyes:     General: No scleral icterus.    Extraocular Movements: Extraocular movements intact.  Cardiovascular:     Rate and Rhythm: Normal rate. Rhythm irregular.     Heart sounds: Normal heart sounds. No murmur heard.  No friction rub. No gallop.  Pulmonary:     Effort: Pulmonary effort is normal. No respiratory distress.     Breath sounds: Normal breath sounds. No wheezing, rhonchi or rales.  Abdominal:     General: Bowel sounds are normal.     Palpations: Abdomen is soft.     Tenderness: There is no abdominal tenderness. There is no guarding or rebound.     Comments: Protuberant abdomen  Musculoskeletal:     Cervical back: Neck supple.     Right lower leg: No edema.     Left lower leg: No edema.  Skin:    General: Skin is warm and dry.      Coloration: Skin is not jaundiced or pale.  Neurological:     General: No focal deficit present.     Mental Status: He is alert and oriented to person, place, and time. Mental status is at baseline.  Psychiatric:        Mood and Affect: Mood normal.        Behavior: Behavior normal.        Thought Content: Thought content normal.        Judgment: Judgment normal.     Laboratory Data Recent Labs  Lab 05/14/21 0757 05/14/21 1139 05/14/21 1518 05/15/21 0458  WBC 12.7* 10.1  --  10.5  HGB 11.4* 11.3* 12.2* 10.3*  HCT 34.7* 34.0* 35.7* 30.9*  PLT 245 187  --  172   Recent Labs  Lab 05/14/21 0757 05/15/21 0349  NA 132* 135  K 4.9 4.8  CL 105 106  CO2 20* 24  BUN 71* 52*  CREATININE 1.06 1.21  CALCIUM 8.7* 8.1*  PROT 6.3*  --   BILITOT 0.8  --   ALKPHOS 28*  --   ALT 22  --   AST 17  --   GLUCOSE 137* 123*   Recent Labs  Lab 05/14/21 0819 05/14/21 1139 05/15/21 0349  INR 2.5* 1.4* 1.6*      Imaging Studies: DG Chest Port 1 View  Result Date: 05/14/2021 CLINICAL DATA:  Chest pain EXAM: PORTABLE CHEST 1 VIEW COMPARISON:  None. FINDINGS: Enlarged cardiac silhouette with splaying of the mainstem bronchi and double density sign suggesting an enlarged left atrium. No focal airspace disease. No pleural effusion. No pneumothorax. No acute osseous abnormality. Ossification of the left coracoclavicular ligament. IMPRESSION: Cardiomegaly.  No focal airspace disease. Electronically Signed   By: Maurine Simmering M.D.   On: 05/14/2021 08:52   US Abdomen Limited RUQ (LIVER/GB)  Result Date: 05/14/2021 CLINICAL DATA:  Cirrhosis EXAM: ULTRASOUND ABDOMEN LIMITED RIGHT UPPER QUADRANT COMPARISON:  None. FINDINGS: Gallbladder: No gallstones or wall thickening visualized. No sonographic Murphy sign noted by sonographer. Common bile duct: Diameter: 3 mm Liver: Parenchymal echogenicity is diffusely increased and mildly heterogeneous. No focal lesions are identified. The liver surface contour is  mildly nodular. Portal vein is patent on color Doppler imaging with normal direction of blood flow towards the liver. Other: None. IMPRESSION: Increased hepatic parenchymal echogenicity with a mildly nodular surface contour suggesting cirrhosis. No focal lesions identified. Electronically Signed   By: Valetta Mole M.D.   On: 05/14/2021 11:44    Assessment:   # Acute UGIB 2/2 gastric ulcers with melena - noted on EGD 05/14/21  # ABLA  # Compensated Cirrhosis  # Etoh abuse  # afib on chronic oac - coumadin held at this time  Plan:  Protonix 40 mg po bid at least 8 weeks [facility]  to advance diet Needs total 7 days of abx- if going outpatient- can provide 500mg  cipro bid Labs reviewed. Hgb likely equilibrating. No further melena. VSS  Monitor H&H.  Transfusion and resuscitation as per primary team. Recheck CBC in 1 week.  Plan to resume coumadin on Monday to allow ulcers more time to heal  Extensive counseling >25 minutes face to face time with wife and patient regarding cirrhosis diagnosis. Pt to schedule follow up in GI office. Provided contact information. Will follow up with pathology from EGD  Counseled on importance etoh cessation Counseled on high protein low sodium diet  Recommend discussing with his PCP/Cardiologist alternatives to anticoagulation for Afib to help reduce risk of GIB in setting of cirrhosis, ie- watchman device  Avoid aspirin and NSAIDs (ibuprofen, Motrin, Advil, naproxen, Aleve, Naprosyn, Goody's powder, & BC powder).  GI to sign off. Available as needed.   I personally performed the service.  Management of other medical comorbidities as per primary team  Thank you for allowing [place] to participate in this patient's care. Please don't hesitate to call if any questions or concerns arise.   [name], DO [facility]  Portions of the record may have been created with voice recognition software. Occasional wrong-word or  'sound-a-like' substitutions may have occurred due to the inherent limitations of voice recognition software.  Read the chart carefully and recognize, using context, where substitutions may have occurred.

## 2021-05-15 NOTE — Progress Notes (Signed)
Discharge teaching completed with patient who is in stable condition. 

## 2021-05-15 NOTE — Consult Note (Signed)
[facility] for Electrolyte Monitoring and Replacement   Recent Labs: Potassium (mmol/L)  Date Value  05/15/2021 4.8  04/12/2014 3.9   Magnesium (mg/dL)  Date Value  05/15/2021 2.1   Calcium (mg/dL)  Date Value  05/15/2021 8.1 (L)   Calcium, Total (mg/dL)  Date Value  04/12/2014 8.2 (L)   Albumin (g/dL)  Date Value  05/14/2021 3.6  12/31/2020 4.1   Phosphorus (mg/dL)  Date Value  05/15/2021 3.8   Sodium (mmol/L)  Date Value  05/15/2021 135  12/31/2020 139  04/12/2014 139   Assessment: Patient is a 66 y/o M with medical history including Afib on warfarin, gout, HTN, obesity, EtOH abuse who is presenting with reports of chest pain, melena x 4 days, SOB, and weakness. Ultimately being admitted for suspected GIB and symptomatic anemia. GI consulted and planning for endoscopy. Patient received a dose of Kcentra for warfarin reversal (INR was 2.5 on presentation). Pharmacy consulted to assist with electrolyte monitoring and replacement as indicated.   Goal of Therapy:  Electrolytes within normal limits  Plan:  --No electrolyte replacement indicated at this time --Follow-up electrolytes with AM labs tomorrow  Dallie Piles 05/15/2021 7:09 AM

## 2021-05-15 NOTE — Consult Note (Signed)
MEDICATION RELATED CONSULT NOTE  Pharmacy Consult for Post-Kcentra lab monitoring Indication: GIB / warfarin reversal Labs: Recent Labs    05/14/21 0757 05/14/21 0819 05/14/21 1139 05/14/21 1518 05/15/21 0349 05/15/21 0458  WBC 12.7*  --  10.1  --   --  10.5  HGB 11.4*  --  11.3* 12.2*  --  10.3*  HCT 34.7*  --  34.0* 35.7*  --  30.9*  PLT 245  --  187  --   --  172  APTT  --  30  --   --   --   --   CREATININE 1.06  --   --   --  1.21  --   MG  --   --   --   --  2.1  --   PHOS  --   --   --   --  3.8  --   ALBUMIN 3.6  --   --   --   --   --   PROT 6.3*  --   --   --   --   --   AST 17  --   --   --   --   --   ALT 22  --   --   --   --   --   ALKPHOS 28*  --   --   --   --   --   BILITOT 0.8  --   --   --   --   --   BILIDIR 0.1  --   --   --   --   --   IBILI 0.7  --   --   --   --   --     Estimated Creatinine Clearance: 62 mL/min (by C-G formula based on SCr of 1.21 mg/dL).   Medications:  Warfarin 5 mg daily (last patient reported dose was 2/16 at 0630) Kcentra 1635 units x 1 on 2/16 (1018 - 1047)  Assessment: Patient is a 66 y/o M with medical history including Afib on warfarin, EtOH abuse, HTN, obesity who presented with symptomatic anemia and melena x 4 days. Suspected GIB in setting of warfarin use and EtOH abuse. GI following and plan is for endoscopy.   Goal of Therapy:  INR <= 1.5  Plan:  --1-hr post Kcentra INR: 1.6 (> goal range): d/w IM MD, no intervention --Will continue to follow post-infusion INR daily x 1 more day  Travis Diaz 05/15/2021,7:38 AM

## 2021-05-15 NOTE — Progress Notes (Signed)
Notified by [facility] of interaction between cipro and coumadin and request doctor approval to dispense.  Reached out to Dr. Billie Ruddy regarding interaction.  Dr. Billie Ruddy instructed it was ok to dispense the medication.  [facility] pharmacy notified of Dr. Remonia Richter instructions.

## 2021-05-15 NOTE — Discharge Summary (Signed)
Physician Discharge Summary   Travis Diaz  male DOB: November 16, 1955  VOJ:[id number]  PCP: [name], MD  Admit date: 05/14/2021 Discharge date: 05/15/2021  Admitted From: home Disposition:  home Wife updated at bedside prior to discharge.  CODE STATUS: Full code  Discharge Instructions     Discharge instructions   Complete by: As directed    Please take 5 days of Cipro as directed at home, starting 05/16/21.  Please take protonix 40 mg twice daily for 3 months.  Please resume warfarin on Monday 05/18/21.   Dr. Enzo Bi [facility] Course:  For full details, please see H&P, progress notes, consult notes and ancillary notes.  Briefly,  Travis Diaz is a 66 y.o. Male with a past medical history significant for Atrial fibrillation on Coumadin, alcohol use, Hypertension, who presented to [facility] ED on 05/14/21 due to complaints of melena, dizziness, dyspnea on exertion, and near syncopal episode.    Pt was hypotensive in the ED, so PCCM asked to admit due to need for possible vasopressors, however, it appeared pt didn't need pressor.  Pt was transferred to [facility] on 2/17.  Acute Upper GI Bleed 2/2 gastric ulcers with melena --in the setting of taking Coumadin for A. Fib (INR 2.5 on admission) -Received Kcentra  -started on Protonix and Octreotide drips and ceftriaxone. -EGD 2/16 with 2 non-bleeding gastric ulcers, gastritis, duodenal erosions without bleeding --per GI rec, pt was discharged on 5 more day of Cipro to complete 7-day abx. --discharged on protonix 40 mg BID for 3 months. --Per GI, ok to resume warfarin on Monday 05/18/21.   Acute Blood Loss Anemia in setting of GI Bleed --Hgb between 10-12, so blood transfusion not given.   Hypotension in setting of Acute Blood Loss  --improved with IVF.  Did not need pressors.   ETOH abuse --did not have withdrawal symptoms. --Counseled on importance etoh cessation --Thiamine, Folic acid, MVI  #  Compensated Cirrhosis --counseling by GI with wife and patient regarding cirrhosis diagnosis. --Counseled on high protein low sodium diet --outpatient f/u with GI  Afib on home warfarin --resume home coreg after discharge --Per GI, ok to resume warfarin on Monday 05/18/21.   Discharge Diagnoses:  Principal Problem:   Acute GI bleeding   30 Day Unplanned Readmission Risk Score    Flowsheet Row ED to Hosp-Admission (Current) from 05/14/2021 in [place] ICU/CCU  30 Day Unplanned Readmission Risk Score (%) 14.72 Filed at 05/15/2021 1200       This score is the patient's risk of an unplanned readmission within 30 days of being discharged (0 -100%). The score is based on dignosis, age, lab data, medications, orders, and past utilization.   Low:  0-14.9   Medium: 15-21.9   High: 22-29.9   Extreme: 30 and above         Discharge Instructions:  Allergies as of 05/15/2021       Reactions   Elemental Sulfur Other (See Comments)   Was told by his mother        Medication List     TAKE these medications    allopurinol 300 MG tablet Commonly known as: ZYLOPRIM Take 1 tablet (300 mg total) by mouth daily. Needs appt for further refills   benazepril 40 MG tablet Commonly known as: LOTENSIN Take 40 mg by mouth daily.   carvedilol 6.25 MG tablet Commonly known as: COREG Take 1 tablet (6.25 mg total) by  mouth 2 (two) times daily.   ciprofloxacin 500 MG tablet Commonly known as: Cipro Take 1 tablet (500 mg total) by mouth 2 (two) times daily for 5 days. Start taking on: May 16, 2021   Flaxseed Oil 1200 MG Caps Take 1,000 mg by mouth daily.   folic acid 1 MG tablet Commonly known as: FOLVITE Take 1 tablet (1 mg total) by mouth daily. Start taking on: May 16, 2021   multivitamin with minerals Tabs tablet Take 1 tablet by mouth daily. Start taking on: May 16, 2021   pantoprazole 40 MG tablet Commonly known as: Protonix Take 1  tablet (40 mg total) by mouth 2 (two) times daily.   thiamine 100 MG tablet Take 1 tablet (100 mg total) by mouth daily.   warfarin 5 MG tablet Commonly known as: COUMADIN Take as directed. If you are unsure how to take this medication, talk to your nurse or doctor. Original instructions: Take 1 tablet (5 mg total) by mouth daily. Home med Start taking on: May 18, 2021 What changed:  additional instructions These instructions start on May 18, 2021. If you are unsure what to do until then, ask your doctor or other care provider.         Follow-up Information     Vigg, Avanti, MD Follow up in 1 week(s).   Specialty: Internal Medicine Contact information: [place] [address] [phone]         [name], DO Follow up in 2 month(s).   Specialty: Gastroenterology Contact information: [facility] [place] [address] [phone]                 Allergies  Allergen Reactions   Elemental Sulfur Other (See Comments)    Was told by his mother     The results of significant diagnostics from this hospitalization (including imaging, microbiology, ancillary and laboratory) are listed below for reference.   Consultations:   Procedures/Studies: DG Chest Port 1 View  Result Date: 05/14/2021 CLINICAL DATA:  Chest pain EXAM: PORTABLE CHEST 1 VIEW COMPARISON:  None. FINDINGS: Enlarged cardiac silhouette with splaying of the mainstem bronchi and double density sign suggesting an enlarged left atrium. No focal airspace disease. No pleural effusion. No pneumothorax. No acute osseous abnormality. Ossification of the left coracoclavicular ligament. IMPRESSION: Cardiomegaly.  No focal airspace disease. Electronically Signed   By: Maurine Simmering M.D.   On: 05/14/2021 08:52   US Abdomen Limited RUQ (LIVER/GB)  Result Date: 05/14/2021 CLINICAL DATA:  Cirrhosis EXAM: ULTRASOUND ABDOMEN LIMITED RIGHT UPPER QUADRANT  COMPARISON:  None. FINDINGS: Gallbladder: No gallstones or wall thickening visualized. No sonographic Murphy sign noted by sonographer. Common bile duct: Diameter: 3 mm Liver: Parenchymal echogenicity is diffusely increased and mildly heterogeneous. No focal lesions are identified. The liver surface contour is mildly nodular. Portal vein is patent on color Doppler imaging with normal direction of blood flow towards the liver. Other: None. IMPRESSION: Increased hepatic parenchymal echogenicity with a mildly nodular surface contour suggesting cirrhosis. No focal lesions identified. Electronically Signed   By: Valetta Mole M.D.   On: 05/14/2021 11:44      Labs: BNP (last 3 results) No results for input(s): BNP in the last 8760 hours. Basic Metabolic Panel: Recent Labs  Lab 05/14/21 0757 05/15/21 0349  NA 132* 135  K 4.9 4.8  CL 105 106  CO2 20* 24  GLUCOSE 137* 123*  BUN 71* 52*  CREATININE 1.06 1.21  CALCIUM  8.7* 8.1*  MG  --  2.1  PHOS  --  3.8   Liver Function Tests: Recent Labs  Lab 05/14/21 0757  AST 17  ALT 22  ALKPHOS 28*  BILITOT 0.8  PROT 6.3*  ALBUMIN 3.6   No results for input(s): LIPASE, AMYLASE in the last 168 hours. No results for input(s): AMMONIA in the last 168 hours. CBC: Recent Labs  Lab 05/14/21 0757 05/14/21 1139 05/14/21 1518 05/15/21 0458  WBC 12.7* 10.1  --  10.5  HGB 11.4* 11.3* 12.2* 10.3*  HCT 34.7* 34.0* 35.7* 30.9*  MCV 98.0 95.5  --  94.8  PLT 245 187  --  172   Cardiac Enzymes: No results for input(s): CKTOTAL, CKMB, CKMBINDEX, TROPONINI in the last 168 hours. BNP: Invalid input(s): POCBNP CBG: Recent Labs  Lab 05/14/21 0830 05/14/21 1527  GLUCAP 129* 103*   D-Dimer No results for input(s): DDIMER in the last 72 hours. Hgb A1c No results for input(s): HGBA1C in the last 72 hours. Lipid Profile No results for input(s): CHOL, HDL, LDLCALC, TRIG, CHOLHDL, LDLDIRECT in the last 72 hours. Thyroid function studies No results for  input(s): TSH, T4TOTAL, T3FREE, THYROIDAB in the last 72 hours.  Invalid input(s): FREET3 Anemia work up No results for input(s): VITAMINB12, FOLATE, FERRITIN, TIBC, IRON, RETICCTPCT in the last 72 hours. Urinalysis    Component Value Date/Time   APPEARANCEUR Clear 02/07/2019 1434   GLUCOSEU Negative 02/07/2019 1434   BILIRUBINUR Negative 02/07/2019 1434   PROTEINUR Negative 02/07/2019 1434   NITRITE Negative 02/07/2019 1434   LEUKOCYTESUR Negative 02/07/2019 1434   Sepsis Labs Invalid input(s): PROCALCITONIN,  WBC,  LACTICIDVEN Microbiology Recent Results (from the past 240 hour(s))  Resp Panel by RT-PCR (Flu A&B, Covid) Nasopharyngeal Swab     Status: None   Collection Time: 05/14/21  8:58 AM   Specimen: Nasopharyngeal Swab; Nasopharyngeal(NP) swabs in vial transport medium  Result Value Ref Range Status   SARS Coronavirus 2 by RT PCR NEGATIVE NEGATIVE Final    Comment: (NOTE) SARS-CoV-2 target nucleic acids are NOT DETECTED.  The SARS-CoV-2 RNA is generally detectable in upper respiratory specimens during the acute phase of infection. The lowest concentration of SARS-CoV-2 viral copies this assay can detect is 138 copies/mL. A negative result does not preclude SARS-Cov-2 infection and should not be used as the sole basis for treatment or other patient management decisions. A negative result may occur with  improper specimen collection/handling, submission of specimen other than nasopharyngeal swab, presence of viral mutation(s) within the areas targeted by this assay, and inadequate number of viral copies(<138 copies/mL). A negative result must be combined with clinical observations, patient history, and epidemiological information. The expected result is Negative.  Fact Sheet for Patients:  EntrepreneurPulse.com.au  Fact Sheet for Healthcare Providers:  IncredibleEmployment.be  This test is no t yet approved or cleared by the [place] FDA and  has been authorized for detection and/or diagnosis of SARS-CoV-2 by FDA under an Emergency Use Authorization (EUA). This EUA will remain  in effect (meaning this test can be used) for the duration of the COVID-19 declaration under Section 564(b)(1) of the Act, 21 U.S.C.section 360bbb-3(b)(1), unless the authorization is terminated  or revoked sooner.       Influenza A by PCR NEGATIVE NEGATIVE Final   Influenza B by PCR NEGATIVE NEGATIVE Final    Comment: (NOTE) The Xpert Xpress SARS-CoV-2/FLU/RSV plus assay is intended as an aid in the diagnosis of influenza from Nasopharyngeal swab specimens  and should not be used as a sole basis for treatment. Nasal washings and aspirates are unacceptable for Xpert Xpress SARS-CoV-2/FLU/RSV testing.  Fact Sheet for Patients: EntrepreneurPulse.com.au  Fact Sheet for Healthcare Providers: IncredibleEmployment.be  This test is not yet approved or cleared by the [place] FDA and has been authorized for detection and/or diagnosis of SARS-CoV-2 by FDA under an Emergency Use Authorization (EUA). This EUA will remain in effect (meaning this test can be used) for the duration of the COVID-19 declaration under Section 564(b)(1) of the Act, 21 U.S.C. section 360bbb-3(b)(1), unless the authorization is terminated or revoked.  Performed at [facility], [address] [place], [place] [address]   CULTURE, BLOOD (ROUTINE X 2) w Reflex to ID Panel     Status: None (Preliminary result)   Collection Time: 05/14/21 11:40 AM   Specimen: BLOOD  Result Value Ref Range Status   Specimen Description BLOOD RIGHT AC  Final   Special Requests   Final    BOTTLES DRAWN AEROBIC AND ANAEROBIC Blood Culture results may not be optimal due to an inadequate volume of blood received in culture bottles   Culture   Final    NO GROWTH < 24 HOURS Performed at [facility], [address],  [place], [place] [address]    Report Status PENDING  Incomplete  CULTURE, BLOOD (ROUTINE X 2) w Reflex to ID Panel     Status: None (Preliminary result)   Collection Time: 05/14/21 11:40 AM   Specimen: BLOOD  Result Value Ref Range Status   Specimen Description BLOOD LEFT AC  Final   Special Requests   Final    BOTTLES DRAWN AEROBIC AND ANAEROBIC Blood Culture adequate volume   Culture   Final    NO GROWTH < 24 HOURS Performed at [facility], [address], [place], [place] [address]    Report Status PENDING  Incomplete     Total time spend on discharging this patient, including the last patient exam, discussing the hospital stay, instructions for ongoing care as it relates to all pertinent caregivers, as well as preparing the medical discharge records, prescriptions, and/or referrals as applicable, is 45 minutes.    Enzo Bi, MD  [facility] 05/15/2021, 2:23 PM

## 2021-05-18 LAB — SURGICAL PATHOLOGY

## 2021-05-19 LAB — CULTURE, BLOOD (ROUTINE X 2)
Culture: NO GROWTH
Culture: NO GROWTH
Special Requests: ADEQUATE

## 2021-06-02 ENCOUNTER — Other Ambulatory Visit: Payer: Medicare Other

## 2021-06-02 ENCOUNTER — Other Ambulatory Visit: Payer: Self-pay

## 2021-06-02 DIAGNOSIS — E7800 Pure hypercholesterolemia, unspecified: Secondary | ICD-10-CM

## 2021-06-02 DIAGNOSIS — I4891 Unspecified atrial fibrillation: Secondary | ICD-10-CM

## 2021-06-02 LAB — URINALYSIS, ROUTINE W REFLEX MICROSCOPIC
Bilirubin, UA: NEGATIVE
Glucose, UA: NEGATIVE
Ketones, UA: NEGATIVE
Leukocytes,UA: NEGATIVE
Nitrite, UA: NEGATIVE
RBC, UA: NEGATIVE
Specific Gravity, UA: >1.03 — ABNORMAL HIGH (ref 1.005–1.030)
Urobilinogen, Ur: 0.2 mg/dL (ref 0.2–1.0)
pH, UA: 5.5 (ref 5.0–7.5)

## 2021-06-02 LAB — MICROSCOPIC EXAMINATION
RBC, Urine: NONE SEEN /hpf (ref 0–2)
WBC, UA: NONE SEEN /hpf (ref 0–5)

## 2021-06-03 LAB — COMPREHENSIVE METABOLIC PANEL
ALT: 23 IU/L (ref 0–44)
AST: 14 IU/L (ref 0–40)
Albumin/Globulin Ratio: 1.9 (ref 1.2–2.2)
Albumin: 3.8 g/dL (ref 3.8–4.8)
Alkaline Phosphatase: 76 IU/L (ref 44–121)
BUN/Creatinine Ratio: 13 (ref 10–24)
BUN: 16 mg/dL (ref 8–27)
Bilirubin Total: 0.3 mg/dL (ref 0.0–1.2)
CO2: 24 mmol/L (ref 20–29)
Calcium: 8.9 mg/dL (ref 8.6–10.2)
Chloride: 105 mmol/L (ref 96–106)
Creatinine, Ser: 1.19 mg/dL (ref 0.76–1.27)
Globulin, Total: 2 g/dL (ref 1.5–4.5)
Glucose: 87 mg/dL (ref 70–99)
Potassium: 4.9 mmol/L (ref 3.5–5.2)
Sodium: 144 mmol/L (ref 134–144)
Total Protein: 5.8 g/dL — ABNORMAL LOW (ref 6.0–8.5)
eGFR: 67 mL/min/{1.73_m2} (ref >59)

## 2021-06-03 LAB — CBC WITH DIFFERENTIAL/PLATELET
Basophils Absolute: 0 10*3/uL (ref 0.0–0.2)
Basos: 1 %
EOS (ABSOLUTE): 0.4 10*3/uL (ref 0.0–0.4)
Eos: 6 %
Hematocrit: 32.6 % — ABNORMAL LOW (ref 37.5–51.0)
Hemoglobin: 10.1 g/dL — ABNORMAL LOW (ref 13.0–17.7)
Immature Grans (Abs): 0.1 10*3/uL (ref 0.0–0.1)
Immature Granulocytes: 1 %
Lymphocytes Absolute: 0.8 10*3/uL (ref 0.7–3.1)
Lymphs: 12 %
MCH: 29.1 pg (ref 26.6–33.0)
MCHC: 31 g/dL — ABNORMAL LOW (ref 31.5–35.7)
MCV: 94 fL (ref 79–97)
Monocytes Absolute: 0.5 10*3/uL (ref 0.1–0.9)
Monocytes: 7 %
Neutrophils Absolute: 4.8 10*3/uL (ref 1.4–7.0)
Neutrophils: 73 %
Platelets: 458 10*3/uL — ABNORMAL HIGH (ref 150–450)
RBC: 3.47 x10E6/uL — ABNORMAL LOW (ref 4.14–5.80)
RDW: 14.9 % (ref 11.6–15.4)
WBC: 6.5 10*3/uL (ref 3.4–10.8)

## 2021-06-03 LAB — LIPID PANEL
Chol/HDL Ratio: 5.2 ratio — ABNORMAL HIGH (ref 0.0–5.0)
Cholesterol, Total: 176 mg/dL (ref 100–199)
HDL: 34 mg/dL — ABNORMAL LOW (ref >39)
LDL Chol Calc (NIH): 120 mg/dL — ABNORMAL HIGH (ref 0–99)
Triglycerides: 122 mg/dL (ref 0–149)
VLDL Cholesterol Cal: 22 mg/dL (ref 5–40)

## 2021-06-10 ENCOUNTER — Ambulatory Visit (INDEPENDENT_AMBULATORY_CARE_PROVIDER_SITE_OTHER): Payer: Medicare Other | Admitting: Internal Medicine

## 2021-06-10 ENCOUNTER — Encounter: Payer: Self-pay | Admitting: Internal Medicine

## 2021-06-10 ENCOUNTER — Other Ambulatory Visit: Payer: Self-pay

## 2021-06-10 VITALS — BP 120/82 | HR 71 | Temp 98.7°F | Ht 70.87 in | Wt 280.6 lb

## 2021-06-10 DIAGNOSIS — K250 Acute gastric ulcer with hemorrhage: Secondary | ICD-10-CM

## 2021-06-10 DIAGNOSIS — D500 Iron deficiency anemia secondary to blood loss (chronic): Secondary | ICD-10-CM | POA: Diagnosis not present

## 2021-06-10 LAB — CBC WITH DIFFERENTIAL/PLATELET
Hematocrit: 31.2 % — ABNORMAL LOW (ref 37.5–51.0)
Hemoglobin: 10.1 g/dL — ABNORMAL LOW (ref 13.0–17.7)
Lymphocytes Absolute: 0.8 10*3/uL (ref 0.7–3.1)
Lymphs: 13 %
MCH: 28.2 pg (ref 26.6–33.0)
MCHC: 32.4 g/dL (ref 31.5–35.7)
MCV: 87 fL (ref 79–97)
MID (Absolute): 0.7 10*3/uL (ref 0.1–1.6)
MID: 12 %
Neutrophils Absolute: 4.6 10*3/uL (ref 1.4–7.0)
Neutrophils: 75 %
Platelets: 400 10*3/uL (ref 150–450)
RBC: 3.58 x10E6/uL — ABNORMAL LOW (ref 4.14–5.80)
RDW: 15.7 % — ABNORMAL HIGH (ref 11.6–15.4)
WBC: 6.1 10*3/uL (ref 3.4–10.8)

## 2021-06-10 LAB — MICROSCOPIC EXAMINATION
Bacteria, UA: NONE SEEN
Epithelial Cells (non renal): NONE SEEN /hpf (ref 0–10)
RBC, Urine: NONE SEEN /hpf (ref 0–2)
WBC, UA: NONE SEEN /hpf (ref 0–5)

## 2021-06-10 LAB — URINALYSIS, ROUTINE W REFLEX MICROSCOPIC
Bilirubin, UA: NEGATIVE
Glucose, UA: NEGATIVE
Leukocytes,UA: NEGATIVE
Nitrite, UA: NEGATIVE
RBC, UA: NEGATIVE
Specific Gravity, UA: 1.025 (ref 1.005–1.030)
Urobilinogen, Ur: 0.2 mg/dL (ref 0.2–1.0)
pH, UA: 6.5 (ref 5.0–7.5)

## 2021-06-10 NOTE — Progress Notes (Signed)
? ?BP 120/82   Pulse 71   Temp 98.7 ?F (37.1 ?C) (Oral)   Ht 5' 10.87" (1.8 m)   Wt 280 lb 9.6 oz (127.3 kg)   SpO2 96%   BMI 39.28 kg/m?   ? ?Subjective:  ? ? Patient ID: Travis Diaz, male    DOB: Jul 27, 1955, 66 y.o.   MRN: [id number] ? ?Chief Complaint  ?Patient presents with  ? Hyperlipidemia  ? Hypertension  ? ? ?HPI: ?Patient was admitted to the hospital overnight for an acute GI bleed had an EGD which showed a bleeding gastric ulcer with melena patient was placed on Protonix and octreotide drips.  Patient has a history of alcohol abuse and was found to have cirrhosis at this hospitalization. ?He was admitted with hypotension and was transferred to the floors where his blood pressure was deemed to be better and patient was discharged the next day. ?Repeat hemoglobin check today was still low at 10 patient denies any melena or hematochezia denies any frank bleeding that he noticed anywhere ? ?US liver : done @ hospital ?Increased hepatic parenchymal echogenicity with a mildly nodular surface contour suggesting cirrhosis. No focal lesions identified. ?  ? ?Anemia ?Presents for initial visit. Symptoms include leg swelling. There has been no abdominal pain, anorexia, bruising/bleeding easily, confusion, fever, pica or weight loss.  ? ?Travis Diaz is a 66 y.o. male ? ?  ? ?Chief Complaint  ?Patient presents with  ? Hyperlipidemia  ? Hypertension  ? ? ?Relevant past medical, surgical, family and social history reviewed and updated as indicated. Interim medical history since our last visit reviewed. ?Allergies and medications reviewed and updated. ? ?Review of Systems  ?Constitutional:  Negative for fever and weight loss.  ?Gastrointestinal:  Negative for abdominal pain and anorexia.  ?Hematological:  Does not bruise/bleed easily.  ?Psychiatric/Behavioral:  Negative for confusion.   ? ?Per HPI unless specifically indicated above ? ?   ?Objective:  ?  ?BP 120/82   Pulse 71   Temp 98.7 ?F (37.1  ?C) (Oral)   Ht 5' 10.87" (1.8 m)   Wt 280 lb 9.6 oz (127.3 kg)   SpO2 96%   BMI 39.28 kg/m?   ?Wt Readings from Last 3 Encounters:  ?06/10/21 280 lb 9.6 oz (127.3 kg)  ?05/14/21 183 lb (83 kg)  ?03/11/21 278 lb 6.4 oz (126.3 kg)  ?  ?Physical Exam ?Vitals and nursing note reviewed.  ?Constitutional:   ?   General: He is not in acute distress. ?   Appearance: Normal appearance. He is not ill-appearing or diaphoretic.  ?HENT:  ?   Head: Normocephalic and atraumatic.  ?   Right Ear: Tympanic membrane and external ear normal. There is no impacted cerumen.  ?   Left Ear: External ear normal.  ?   Nose: No congestion or rhinorrhea.  ?   Mouth/Throat:  ?   Pharynx: No oropharyngeal exudate or posterior oropharyngeal erythema.  ?Eyes:  ?   Conjunctiva/sclera: Conjunctivae normal.  ?   Pupils: Pupils are equal, round, and reactive to light.  ?Cardiovascular:  ?   Rate and Rhythm: Normal rate and regular rhythm.  ?   Heart sounds: No murmur heard. ?  No friction rub. No gallop.  ?Pulmonary:  ?   Effort: No respiratory distress.  ?   Breath sounds: No stridor. No wheezing or rhonchi.  ?Chest:  ?   Chest wall: No tenderness.  ?Abdominal:  ?   General: Abdomen is flat. Bowel  sounds are normal.  ?   Palpations: Abdomen is soft. There is no mass.  ?   Tenderness: There is no abdominal tenderness.  ?Musculoskeletal:  ?   Cervical back: Normal range of motion and neck supple. No rigidity or tenderness.  ?   Left lower leg: No edema.  ?Skin: ?   General: Skin is warm and dry.  ?Neurological:  ?   Mental Status: He is alert.  ? ? ?Results for orders placed or performed in visit on 06/10/21  ?Microscopic Examination  ? Urine  ?Result Value Ref Range  ? WBC, UA None seen 0 - 5 /hpf  ? RBC None seen 0 - 2 /hpf  ? Epithelial Cells (non renal) None seen 0 - 10 /hpf  ? Mucus, UA Present (A) Not Estab.  ? Bacteria, UA None seen None seen/Few  ?Urinalysis, Routine w reflex microscopic  ?Result Value Ref Range  ? Specific Gravity, UA  1.025 1.005 - 1.030  ? pH, UA 6.5 5.0 - 7.5  ? Color, UA Yellow Yellow  ? Appearance Ur Clear Clear  ? Leukocytes,UA Negative Negative  ? Protein,UA 1+ (A) Negative/Trace  ? Glucose, UA Negative Negative  ? Ketones, UA Trace (A) Negative  ? RBC, UA Negative Negative  ? Bilirubin, UA Negative Negative  ? Urobilinogen, Ur 0.2 0.2 - 1.0 mg/dL  ? Nitrite, UA Negative Negative  ? Microscopic Examination See below:   ?CBC With Differential/Platelet  ?Result Value Ref Range  ? WBC 6.1 3.4 - 10.8 x10E3/uL  ? RBC 3.58 (L) 4.14 - 5.80 x10E6/uL  ? Hemoglobin 10.1 (L) 13.0 - 17.7 g/dL  ? Hematocrit 31.2 (L) 37.5 - 51.0 %  ? MCV 87 79 - 97 fL  ? MCH 28.2 26.6 - 33.0 pg  ? MCHC 32.4 31.5 - 35.7 g/dL  ? RDW 15.7 (H) 11.6 - 15.4 %  ? Platelets 400 150 - 450 x10E3/uL  ? Neutrophils 75 Not Estab. %  ? Lymphs 13 Not Estab. %  ? MID 12 Not Estab. %  ? Neutrophils Absolute 4.6 1.4 - 7.0 x10E3/uL  ? Lymphocytes Absolute 0.8 0.7 - 3.1 x10E3/uL  ? MID (Absolute) 0.7 0.1 - 1.6 X10E3/uL  ? ?   ? ? ?Current Outpatient Medications:  ?  allopurinol (ZYLOPRIM) 300 MG tablet, Take 1 tablet (300 mg total) by mouth daily. Needs appt for further refills, Disp: 90 tablet, Rfl: 2 ?  benazepril (LOTENSIN) 40 MG tablet, Take 40 mg by mouth daily., Disp: , Rfl:  ?  carvedilol (COREG) 6.25 MG tablet, Take 1 tablet (6.25 mg total) by mouth 2 (two) times daily., Disp: 180 tablet, Rfl: 3 ?  Flaxseed, Linseed, (FLAXSEED OIL) 1200 MG CAPS, Take 1,000 mg by mouth daily. , Disp: , Rfl:  ?  folic acid (FOLVITE) 1 MG tablet, Take 1 tablet (1 mg total) by mouth daily., Disp: , Rfl:  ?  Multiple Vitamin (MULTIVITAMIN WITH MINERALS) TABS tablet, Take 1 tablet by mouth daily., Disp: , Rfl:  ?  pantoprazole (PROTONIX) 40 MG tablet, Take 1 tablet (40 mg total) by mouth 2 (two) times daily., Disp: 180 tablet, Rfl: 0 ?  thiamine 100 MG tablet, Take 1 tablet (100 mg total) by mouth daily., Disp: , Rfl:  ?  warfarin (COUMADIN) 5 MG tablet, Take 1 tablet (5 mg total) by  mouth daily. Home med, Disp: 30 tablet, Rfl: 1  ? ? ?Assessment & Plan:  ?Anemia :  ?Check labs including iron studies, b12 ,  folate wnl, check hemoccults  ?pt deneis symptoms. adviced to contact./ call clinic if symptoms worsen. ?Pt needs to fu with GI asap. ? ? ?Lower ext edema -  ? ? ?Problem List Items Addressed This Visit   ?None ?Visit Diagnoses   ? ? Iron deficiency anemia due to chronic blood loss    -  Primary  ? Relevant Orders  ? CBC With Differential/Platelet  ? CBC with Differential/Platelet  ? Comprehensive metabolic panel  ? Urinalysis, Routine w reflex microscopic (Completed)  ? Ferritin  ? Iron and TIBC  ? Fecal occult blood, imunochemical  ? Acute gastric ulcer with hemorrhage      ? Relevant Orders  ? Ambulatory referral to Gastroenterology  ? Fecal occult blood, imunochemical  ? ?  ?  ? ?Orders Placed This Encounter  ?Procedures  ? Fecal occult blood, imunochemical  ? Microscopic Examination  ? CBC With Differential/Platelet  ? CBC with Differential/Platelet  ? Comprehensive metabolic panel  ? Urinalysis, Routine w reflex microscopic  ? Ferritin  ? Iron and TIBC  ? CBC With Differential/Platelet  ? Ambulatory referral to Gastroenterology  ?  ? ?No orders of the defined types were placed in this encounter. ?  ? ?Follow up plan: ?No follow-ups on file. ? ? ?

## 2021-06-11 ENCOUNTER — Telehealth: Payer: Self-pay

## 2021-06-11 LAB — COMPREHENSIVE METABOLIC PANEL
ALT: 16 IU/L (ref 0–44)
AST: 13 IU/L (ref 0–40)
Albumin/Globulin Ratio: 1.9 (ref 1.2–2.2)
Albumin: 3.9 g/dL (ref 3.8–4.8)
Alkaline Phosphatase: 65 IU/L (ref 44–121)
BUN/Creatinine Ratio: 16 (ref 10–24)
BUN: 18 mg/dL (ref 8–27)
Bilirubin Total: 0.3 mg/dL (ref 0.0–1.2)
CO2: 23 mmol/L (ref 20–29)
Calcium: 8.9 mg/dL (ref 8.6–10.2)
Chloride: 100 mmol/L (ref 96–106)
Creatinine, Ser: 1.11 mg/dL (ref 0.76–1.27)
Globulin, Total: 2.1 g/dL (ref 1.5–4.5)
Glucose: 81 mg/dL (ref 70–99)
Potassium: 4.8 mmol/L (ref 3.5–5.2)
Sodium: 138 mmol/L (ref 134–144)
Total Protein: 6 g/dL (ref 6.0–8.5)
eGFR: 73 mL/min/{1.73_m2} (ref >59)

## 2021-06-11 LAB — IRON AND TIBC
Iron Saturation: 7 % — CL (ref 15–55)
Iron: 24 ug/dL — ABNORMAL LOW (ref 38–169)
Total Iron Binding Capacity: 353 ug/dL (ref 250–450)
UIBC: 329 ug/dL (ref 111–343)

## 2021-06-11 LAB — CBC WITH DIFFERENTIAL/PLATELET
Basophils Absolute: 0.1 10*3/uL (ref 0.0–0.2)
Basos: 1 %
EOS (ABSOLUTE): 0.5 10*3/uL — ABNORMAL HIGH (ref 0.0–0.4)
Eos: 7 %
Hematocrit: 32.9 % — ABNORMAL LOW (ref 37.5–51.0)
Hemoglobin: 10.5 g/dL — ABNORMAL LOW (ref 13.0–17.7)
Immature Grans (Abs): 0 10*3/uL (ref 0.0–0.1)
Immature Granulocytes: 1 %
Lymphocytes Absolute: 0.8 10*3/uL (ref 0.7–3.1)
Lymphs: 14 %
MCH: 28.4 pg (ref 26.6–33.0)
MCHC: 31.9 g/dL (ref 31.5–35.7)
MCV: 89 fL (ref 79–97)
Monocytes Absolute: 0.6 10*3/uL (ref 0.1–0.9)
Monocytes: 9 %
Neutrophils Absolute: 4.2 10*3/uL (ref 1.4–7.0)
Neutrophils: 68 %
Platelets: 414 10*3/uL (ref 150–450)
RBC: 3.7 x10E6/uL — ABNORMAL LOW (ref 4.14–5.80)
RDW: 15.6 % — ABNORMAL HIGH (ref 11.6–15.4)
WBC: 6.2 10*3/uL (ref 3.4–10.8)

## 2021-06-11 LAB — FERRITIN: Ferritin: 18 ng/mL — ABNORMAL LOW (ref 30–400)

## 2021-06-11 NOTE — Progress Notes (Signed)
Pl let pt know thnx.

## 2021-06-11 NOTE — Progress Notes (Signed)
Needs to heme onc asap will refer please let pt know probably would benefit from iv iron

## 2021-06-11 NOTE — Addendum Note (Signed)
Addended byCharlynne Cousins on: 06/11/2021 09:52 AM ? ? Modules accepted: Orders ? ?

## 2021-06-11 NOTE — Telephone Encounter (Signed)
Patient stated he already has appointment with DR Virgina Jock gastro  ?

## 2021-06-17 ENCOUNTER — Ambulatory Visit (INDEPENDENT_AMBULATORY_CARE_PROVIDER_SITE_OTHER): Payer: Medicare Other

## 2021-06-17 ENCOUNTER — Other Ambulatory Visit: Payer: Self-pay

## 2021-06-17 DIAGNOSIS — I482 Chronic atrial fibrillation, unspecified: Secondary | ICD-10-CM

## 2021-06-17 DIAGNOSIS — Z7901 Long term (current) use of anticoagulants: Secondary | ICD-10-CM

## 2021-06-17 LAB — POCT INR: INR: 1.3 — AB (ref 2.0–3.0)

## 2021-06-17 MED ORDER — WARFARIN SODIUM 5 MG PO TABS: 5.0000 mg | ORAL_TABLET | ORAL | 1 refills | Status: DC

## 2021-06-17 MED ORDER — WARFARIN SODIUM 5 MG PO TABS: 5.0000 mg | ORAL_TABLET | Freq: Every day | ORAL | 1 refills | Status: DC

## 2021-06-17 NOTE — Patient Instructions (Signed)
-   take 1.5 tablets warfarin tonight and tomorrow, then ?- continue dosage 1 tablet every day EXCEPT 1/2 TABLET ON MONDAYS AND FRIDAYS. ?- recheck INR 4 weeks ?

## 2021-06-17 NOTE — Progress Notes (Signed)
Pt reports that he has bleeding ulcers and his "blood is low", so he is taking it upon himself to hold his warfarin for 2 weeks.  Had a lengthy discussion w/ him about the dangers of holding is warfarin and afterwards, he verbalizes agreement to continue taking since he is seeing Dr. Fletcher Anon next week.  While discussing afib, pt reports that he has not been taking his Coreg and his HR has been in the 90s.  After further discussion, he states that he will resume this med, as well.  ?

## 2021-06-26 ENCOUNTER — Ambulatory Visit: Payer: PRIVATE HEALTH INSURANCE | Admitting: Cardiovascular Disease

## 2021-07-15 ENCOUNTER — Ambulatory Visit (INDEPENDENT_AMBULATORY_CARE_PROVIDER_SITE_OTHER): Payer: Medicare Other

## 2021-07-15 DIAGNOSIS — I482 Chronic atrial fibrillation, unspecified: Secondary | ICD-10-CM | POA: Diagnosis not present

## 2021-07-15 DIAGNOSIS — Z7901 Long term (current) use of anticoagulants: Secondary | ICD-10-CM | POA: Diagnosis not present

## 2021-07-15 LAB — POCT INR: INR: 1.6 — AB (ref 2.0–3.0)

## 2021-07-15 NOTE — Patient Instructions (Signed)
TAKE 2 TABLETS TODAY then ?- continue dosage 1 tablet every day EXCEPT 1/2 TABLET ON MONDAYS AND FRIDAYS. ?- recheck INR 2 weeks ?

## 2021-07-22 ENCOUNTER — Encounter: Payer: Self-pay | Admitting: Internal Medicine

## 2021-07-22 ENCOUNTER — Ambulatory Visit (INDEPENDENT_AMBULATORY_CARE_PROVIDER_SITE_OTHER): Payer: Medicare Other | Admitting: Internal Medicine

## 2021-07-22 VITALS — BP 136/85 | HR 72 | Temp 98.1°F | Ht 70.87 in | Wt 269.4 lb

## 2021-07-22 DIAGNOSIS — Z13220 Encounter for screening for lipoid disorders: Secondary | ICD-10-CM | POA: Insufficient documentation

## 2021-07-22 DIAGNOSIS — I4811 Longstanding persistent atrial fibrillation: Secondary | ICD-10-CM | POA: Diagnosis not present

## 2021-07-22 LAB — URINALYSIS, ROUTINE W REFLEX MICROSCOPIC
Bilirubin, UA: NEGATIVE
Glucose, UA: NEGATIVE
Ketones, UA: NEGATIVE
Leukocytes,UA: NEGATIVE
Nitrite, UA: NEGATIVE
Protein,UA: NEGATIVE
RBC, UA: NEGATIVE
Specific Gravity, UA: >1.03 — ABNORMAL HIGH (ref 1.005–1.030)
Urobilinogen, Ur: 0.2 mg/dL (ref 0.2–1.0)
pH, UA: 6 (ref 5.0–7.5)

## 2021-07-22 LAB — CBC WITH DIFFERENTIAL/PLATELET
Hematocrit: 45.1 % (ref 37.5–51.0)
Hemoglobin: 14.8 g/dL (ref 13.0–17.7)
Lymphocytes Absolute: 1.1 10*3/uL (ref 0.7–3.1)
Lymphs: 16 %
MCH: 26.6 pg (ref 26.6–33.0)
MCHC: 32.8 g/dL (ref 31.5–35.7)
MCV: 81 fL (ref 79–97)
MID (Absolute): 0.9 10*3/uL (ref 0.1–1.6)
MID: 13 %
Neutrophils Absolute: 5 10*3/uL (ref 1.4–7.0)
Neutrophils: 72 %
Platelets: 315 10*3/uL (ref 150–450)
RBC: 5.56 x10E6/uL (ref 4.14–5.80)
RDW: 17.9 % — ABNORMAL HIGH (ref 11.6–15.4)
WBC: 7 10*3/uL (ref 3.4–10.8)

## 2021-07-22 NOTE — Progress Notes (Signed)
? ?BP 136/85   Pulse 72   Temp 98.1 ?F (36.7 ?C) (Oral)   Ht 5' 10.87" (1.8 m)   Wt 269 lb 6.4 oz (122.2 kg)   SpO2 100%   BMI 37.72 kg/m?   ? ?Subjective:  ? ? Patient ID: Travis Diaz, male    DOB: 04-02-1955, 66 y.o.   MRN: [id number] ? ?Chief Complaint  ?Patient presents with  ? Iron defieciency anemia  ?  F/u  ? ? ?HPI: ?Travis Diaz is a 66 y.o. male ? ?Anemia ?Presents for follow-up visit. There has been no abdominal pain, anorexia, bruising/bleeding easily, confusion, fever, leg swelling, light-headedness, malaise/fatigue, pallor, palpitations, paresthesias, pica or weight loss.  ? ?Chief Complaint  ?Patient presents with  ? Iron defieciency anemia  ?  F/u  ? ? ?Relevant past medical, surgical, family and social history reviewed and updated as indicated. Interim medical history since our last visit reviewed. ?Allergies and medications reviewed and updated. ? ?Review of Systems  ?Constitutional:  Negative for fever, malaise/fatigue and weight loss.  ?Cardiovascular:  Negative for palpitations.  ?Gastrointestinal:  Negative for abdominal pain and anorexia.  ?Skin:  Negative for pallor.  ?Neurological:  Negative for light-headedness and paresthesias.  ?Hematological:  Does not bruise/bleed easily.  ?Psychiatric/Behavioral:  Negative for confusion.   ? ?Per HPI unless specifically indicated above ? ?   ?Objective:  ?  ?BP 136/85   Pulse 72   Temp 98.1 ?F (36.7 ?C) (Oral)   Ht 5' 10.87" (1.8 m)   Wt 269 lb 6.4 oz (122.2 kg)   SpO2 100%   BMI 37.72 kg/m?   ?Wt Readings from Last 3 Encounters:  ?07/22/21 269 lb 6.4 oz (122.2 kg)  ?06/10/21 280 lb 9.6 oz (127.3 kg)  ?05/14/21 183 lb (83 kg)  ?  ?Physical Exam ?Vitals and nursing note reviewed.  ?Constitutional:   ?   General: He is not in acute distress. ?   Appearance: Normal appearance. He is not ill-appearing or diaphoretic.  ?HENT:  ?   Head: Normocephalic and atraumatic.  ?   Right Ear: Tympanic membrane and external ear normal. There  is no impacted cerumen.  ?   Left Ear: External ear normal.  ?   Nose: No congestion or rhinorrhea.  ?   Mouth/Throat:  ?   Pharynx: No oropharyngeal exudate or posterior oropharyngeal erythema.  ?Eyes:  ?   Conjunctiva/sclera: Conjunctivae normal.  ?   Pupils: Pupils are equal, round, and reactive to light.  ?Cardiovascular:  ?   Rate and Rhythm: Normal rate and regular rhythm.  ?   Heart sounds: No murmur heard. ?  No friction rub. No gallop.  ?Pulmonary:  ?   Effort: No respiratory distress.  ?   Breath sounds: No stridor. No wheezing or rhonchi.  ?Chest:  ?   Chest wall: No tenderness.  ?Abdominal:  ?   General: Abdomen is flat. Bowel sounds are normal.  ?   Palpations: Abdomen is soft. There is no mass.  ?   Tenderness: There is no abdominal tenderness.  ?Musculoskeletal:  ?   Cervical back: Normal range of motion and neck supple. No rigidity or tenderness.  ?   Left lower leg: No edema.  ?Skin: ?   General: Skin is warm and dry.  ?Neurological:  ?   Mental Status: He is alert.  ? ? ?Results for orders placed or performed in visit on 07/22/21  ?CBC With Differential/Platelet  ?Result Value Ref Range  ?  WBC 7.0 3.4 - 10.8 x10E3/uL  ? RBC 5.56 4.14 - 5.80 x10E6/uL  ? Hemoglobin 14.8 13.0 - 17.7 g/dL  ? Hematocrit 45.1 37.5 - 51.0 %  ? MCV 81 79 - 97 fL  ? MCH 26.6 26.6 - 33.0 pg  ? MCHC 32.8 31.5 - 35.7 g/dL  ? RDW 17.9 (H) 11.6 - 15.4 %  ? Platelets 315 150 - 450 x10E3/uL  ? Neutrophils 72 Not Estab. %  ? Lymphs 16 Not Estab. %  ? MID 13 Not Estab. %  ? Neutrophils Absolute 5.0 1.4 - 7.0 x10E3/uL  ? Lymphocytes Absolute 1.1 0.7 - 3.1 x10E3/uL  ? MID (Absolute) 0.9 0.1 - 1.6 X10E3/uL  ?Urinalysis, Routine w reflex microscopic  ?Result Value Ref Range  ? Specific Gravity, UA >1.030 (H) 1.005 - 1.030  ? pH, UA 6.0 5.0 - 7.5  ? Color, UA Yellow Yellow  ? Appearance Ur Clear Clear  ? Leukocytes,UA Negative Negative  ? Protein,UA Negative Negative/Trace  ? Glucose, UA Negative Negative  ? Ketones, UA Negative Negative   ? RBC, UA Negative Negative  ? Bilirubin, UA Negative Negative  ? Urobilinogen, Ur 0.2 0.2 - 1.0 mg/dL  ? Nitrite, UA Negative Negative  ? ?   ? ? ?Current Outpatient Medications:  ?  allopurinol (ZYLOPRIM) 300 MG tablet, Take 1 tablet (300 mg total) by mouth daily. Needs appt for further refills, Disp: 90 tablet, Rfl: 2 ?  benazepril (LOTENSIN) 40 MG tablet, Take 40 mg by mouth daily., Disp: , Rfl:  ?  carvedilol (COREG) 6.25 MG tablet, Take 1 tablet (6.25 mg total) by mouth 2 (two) times daily., Disp: 180 tablet, Rfl: 3 ?  Flaxseed, Linseed, (FLAXSEED OIL) 1200 MG CAPS, Take 1,000 mg by mouth daily. , Disp: , Rfl:  ?  folic acid (FOLVITE) 1 MG tablet, Take 1 tablet (1 mg total) by mouth daily., Disp: , Rfl:  ?  magnesium 30 MG tablet, Take 30 mg by mouth 2 (two) times daily., Disp: , Rfl:  ?  Multiple Vitamin (MULTIVITAMIN WITH MINERALS) TABS tablet, Take 1 tablet by mouth daily., Disp: , Rfl:  ?  pantoprazole (PROTONIX) 40 MG tablet, Take 1 tablet (40 mg total) by mouth 2 (two) times daily., Disp: 180 tablet, Rfl: 0 ?  warfarin (COUMADIN) 5 MG tablet, Take 1 tablet (5 mg total) by mouth as directed. Take as directed by the anti-coag clinic., Disp: 30 tablet, Rfl: 1 ?  thiamine 100 MG tablet, Take 1 tablet (100 mg total) by mouth daily. (Patient not taking: Reported on 07/22/2021), Disp: , Rfl:   ? ? ?Assessment & Plan:  ?Afib : is on coumadin for such stable, chorinc is on coreg for such  ? ?Anemia - sec to Gastic ulcer / Gastritis  ? ?Alcoholic cirrhosis check LFTs sees GI beer on weekends 14 a day x 2 days imaging concerning for underlying alcoholic cirrhosis. ? ? ?Problem List Items Addressed This Visit   ? ?  ? Cardiovascular and Mediastinum  ? Atrial fibrillation ([place]) - Primary  ? Relevant Orders  ? Comprehensive metabolic panel  ? TSH  ? Urinalysis, Routine w reflex microscopic  ? CBC With Differential/Platelet (Completed)  ?  ? Other  ? Screening for lipid disorders  ? Relevant Orders  ? Lipid panel  ?   ? ?Orders Placed This Encounter  ?Procedures  ? Comprehensive metabolic panel  ? TSH  ? Urinalysis, Routine w reflex microscopic  ? CBC With Differential/Platelet  ?  Lipid panel  ? Urinalysis, Routine w reflex microscopic  ?  ? ?No orders of the defined types were placed in this encounter. ?  ? ?Follow up plan: ?Return in about 6 weeks (around 09/02/2021). ? ? ? ?

## 2021-07-23 LAB — TSH: TSH: 4.85 u[IU]/mL — ABNORMAL HIGH (ref 0.450–4.500)

## 2021-07-23 LAB — COMPREHENSIVE METABOLIC PANEL
ALT: 29 IU/L (ref 0–44)
AST: 26 IU/L (ref 0–40)
Albumin/Globulin Ratio: 1.7 (ref 1.2–2.2)
Albumin: 4.3 g/dL (ref 3.8–4.8)
Alkaline Phosphatase: 59 IU/L (ref 44–121)
BUN/Creatinine Ratio: 19 (ref 10–24)
BUN: 23 mg/dL (ref 8–27)
Bilirubin Total: 0.4 mg/dL (ref 0.0–1.2)
CO2: 22 mmol/L (ref 20–29)
Calcium: 9.5 mg/dL (ref 8.6–10.2)
Chloride: 102 mmol/L (ref 96–106)
Creatinine, Ser: 1.21 mg/dL (ref 0.76–1.27)
Globulin, Total: 2.5 g/dL (ref 1.5–4.5)
Glucose: 91 mg/dL (ref 70–99)
Potassium: 5 mmol/L (ref 3.5–5.2)
Sodium: 139 mmol/L (ref 134–144)
Total Protein: 6.8 g/dL (ref 6.0–8.5)
eGFR: 66 mL/min/{1.73_m2} (ref >59)

## 2021-07-29 ENCOUNTER — Ambulatory Visit (INDEPENDENT_AMBULATORY_CARE_PROVIDER_SITE_OTHER): Payer: Medicare Other

## 2021-07-29 DIAGNOSIS — Z7901 Long term (current) use of anticoagulants: Secondary | ICD-10-CM

## 2021-07-29 DIAGNOSIS — I482 Chronic atrial fibrillation, unspecified: Secondary | ICD-10-CM

## 2021-07-29 LAB — POCT INR: INR: 1.7 — AB (ref 2.0–3.0)

## 2021-07-29 NOTE — Patient Instructions (Signed)
-   INCREASE TO 1 tablet every day EXCEPT 1/2 TABLET ON WEDNESDAY. ?- recheck INR 3 weeks ?

## 2021-08-06 ENCOUNTER — Ambulatory Visit (INDEPENDENT_AMBULATORY_CARE_PROVIDER_SITE_OTHER): Payer: Medicare Other | Admitting: Cardiovascular Disease

## 2021-08-06 ENCOUNTER — Encounter: Payer: Self-pay | Admitting: Cardiovascular Disease

## 2021-08-06 VITALS — BP 124/70 | HR 71 | Ht 71.0 in | Wt 271.1 lb

## 2021-08-06 DIAGNOSIS — I482 Chronic atrial fibrillation, unspecified: Secondary | ICD-10-CM | POA: Diagnosis not present

## 2021-08-06 DIAGNOSIS — I10 Essential (primary) hypertension: Secondary | ICD-10-CM | POA: Diagnosis not present

## 2021-08-06 DIAGNOSIS — Z7901 Long term (current) use of anticoagulants: Secondary | ICD-10-CM

## 2021-08-06 NOTE — Patient Instructions (Signed)

## 2021-08-06 NOTE — Progress Notes (Signed)
?  ?Cardiology Office Note ? ? ?Date:  08/06/2021  ? ?ID:  Travis Diaz, DOB [birth year], MRN [id number] ? ?PCP:  [name], MD  ?Cardiologist:   [name], MD  ? ?Chief Complaint  ?Patient presents with  ? OTHER  ?  4 Month f/u no complaints today. Meds reviewed verbally with pt.  ? ? ? ?  ?History of Present Illness: ?Travis Diaz is a 66 y.o. male who is here today for follow-up visit regarding atrial fibrillation.  He has known history of essential hypertension, hyperlipidemia, obesity, tobacco use and alcohol use.  He quit smoking in 2006.  He drinks beer and liquor.  Sometimes he drinks 15 beers when he is watching baseball but he reports that he does not do that very often and denies excessive drinking.   ?He was noted incidentally to be in atrial fibrillation during an office visit in October.  Thus, he was referred for evaluation.  During my initial evaluation, I added carvedilol for blood pressure control and also heart rate control.  In addition, he was started on Xarelto for anticoagulation.   ?An echocardiogram was done recently and showed normal LV systolic function with moderate biatrial enlargement and moderate mitral regurgitation. ?He was asymptomatic from atrial fibrillation standpoint and thus was treated with rate control.  He could not afford Xarelto and thus was switched to warfarin. ?He was hospitalized in February of this year with upper GI bleed.  INR was 2.5.  EGD showed 2 nonbleeding gastric ulcers, gastritis and duodenal erosions. ? ?He has been doing well with no recent chest pain, shortness of breath or palpitations.  Since he had his GI bleed, he stopped drinking hard liquor and only drinks beer on weekends.  No further GI bleed and hence most recent hemoglobin was normal. ? ?He has some lower extremity swelling with chronic stasis dermatitis and superficial ulceration. ? ?Past Medical History:  ?Diagnosis Date  ? A-fib ([place])   ? Gout   ? Hypertension   ? ? ?Past  Surgical History:  ?Procedure Laterality Date  ? broken arm    ? COLONOSCOPY WITH PROPOFOL N/A 05/14/2016  ? Procedure: COLONOSCOPY WITH PROPOFOL;  Surgeon: Manya Silvas, MD;  Location: [facility] ENDOSCOPY;  Service: Endoscopy;  Laterality: N/A;  ? ESOPHAGOGASTRODUODENOSCOPY (EGD) WITH PROPOFOL N/A 05/14/2021  ? Procedure: ESOPHAGOGASTRODUODENOSCOPY (EGD) WITH PROPOFOL;  Surgeon: [name], DO;  Location: [facility] ENDOSCOPY;  Service: Gastroenterology;  Laterality: N/A;  ? upper leg procedure Right   ? removal of blood clots  ? ? ? ?Current Outpatient Medications  ?Medication Sig Dispense Refill  ? allopurinol (ZYLOPRIM) 300 MG tablet Take 1 tablet (300 mg total) by mouth daily. Needs appt for further refills 90 tablet 2  ? benazepril (LOTENSIN) 40 MG tablet Take 40 mg by mouth daily.    ? carvedilol (COREG) 6.25 MG tablet Take 1 tablet (6.25 mg total) by mouth 2 (two) times daily. 180 tablet 3  ? Flaxseed, Linseed, (FLAXSEED OIL) 1200 MG CAPS Take 1,000 mg by mouth daily.     ? magnesium 30 MG tablet Take 30 mg by mouth 2 (two) times daily.    ? pantoprazole (PROTONIX) 40 MG tablet Take 1 tablet (40 mg total) by mouth 2 (two) times daily. 180 tablet 0  ? warfarin (COUMADIN) 5 MG tablet Take 1 tablet (5 mg total) by mouth as directed. Take as directed by the anti-coag clinic. 30 tablet 1  ? ?No current facility-administered medications for this  visit.  ? ? ?Allergies:   Elemental sulfur  ? ? ?Social History:  The patient  reports that he quit smoking about 17 years ago. His smoking use included cigarettes. He has never used smokeless tobacco. He reports current alcohol use of about 24.0 standard drinks per week. He reports that he does not use drugs.  ? ?Family History:  The patient's family history includes Cancer in his father and mother; Dementia in his maternal grandmother; Diabetes in his sister; Early death in his father and paternal grandfather; Heart attack in his father, maternal grandfather, paternal  grandfather, and paternal grandmother; Heart disease in his father.  ? ? ?ROS:  Please see the history of present illness.   Otherwise, review of systems are positive for none.   All other systems are reviewed and negative.  ? ? ?PHYSICAL EXAM: ?VS:  BP 124/70 (BP Location: Left Arm, Patient Position: Sitting, Cuff Size: Large)   Pulse 71   Ht '5\' 11"'$  (1.803 m)   Wt 271 lb 2 oz (123 kg)   SpO2 98%   BMI 37.81 kg/m?  , BMI Body mass index is 37.81 kg/m?. ?GEN: Well nourished, well developed, in no acute distress  ?HEENT: normal  ?Neck: no JVD, carotid bruits, or masses ?Cardiac: Irregularly irregular; no murmurs, rubs, or gallops.  Mild bilateral leg edema with chronic stasis dermatitis.  Superficial ulceration. ?Respiratory:  clear to auscultation bilaterally, normal work of breathing ?GI: soft, nontender, nondistended, + BS ?MS: no deformity or atrophy  ?Skin: warm and dry, no rash ?Neuro:  Strength and sensation are intact ?Psych: euthymic mood, full affect ?Vascular: Distal pulses are palpable. ? ? ?EKG:  EKG is ordered today. ?EKG showed atrial fibrillation with ventricular rate of 71 bpm. ? ? ? ?Recent Labs: ?05/15/2021: Magnesium 2.1 ?07/22/2021: ALT 29; BUN 23; Creatinine, Ser 1.21; Hemoglobin 14.8; Platelets 315; Potassium 5.0; Sodium 139; TSH 4.850  ? ? ?Lipid Panel ?   ?Component Value Date/Time  ? CHOL 176 06/02/2021 0827  ? TRIG 122 06/02/2021 0827  ? HDL 34 (L) 06/02/2021 0827  ? CHOLHDL 5.2 (H) 06/02/2021 0827  ? [place] 120 (H) 06/02/2021 0827  ? ?  ? ?Wt Readings from Last 3 Encounters:  ?08/06/21 271 lb 2 oz (123 kg)  ?07/22/21 269 lb 6.4 oz (122.2 kg)  ?06/10/21 280 lb 9.6 oz (127.3 kg)  ?  ? ? ? ? ?  01/09/2021  ?  7:57 AM  ?PAD Screen  ?Previous PAD dx? No  ?Previous surgical procedure? No  ?Pain with walking? No  ?Feet/toe relief with dangling? No  ?Painful, non-healing ulcers? No  ?Extremities discolored? No  ? ? ? ? ?ASSESSMENT AND PLAN: ? ?1.  Permanent atrial fibrillation: Ventricular rate  is well controlled on carvedilol and he is asymptomatic.  He is on anticoagulation with warfarin given that he could not afford DOACs.   ? ?2.  Essential hypertension: Blood pressures well controlled on carvedilol and benazepril. ? ?3.  Possible sleep apnea: The patient declined sleep study. ? ?4.  GI bleed: He had GI bleed in February in the setting of anticoagulation with warfarin.  Also he was drinking a lot of hard liquor and since then he stopped.  No further bleeding and most recent hemoglobin returned back to normal. ? ?5.  Chronic stasis dermatitis of lower extremities with edema: Distal pulses are normal.  No evidence of peripheral arterial disease.  Continue leg elevation. ? ? ? ?Disposition:   FU with me in  6 month ? ?Signed, ? ?[name], MD  ?08/06/2021 10:48 AM    ?[place] ? ?

## 2021-08-19 ENCOUNTER — Ambulatory Visit (INDEPENDENT_AMBULATORY_CARE_PROVIDER_SITE_OTHER): Payer: Medicare Other

## 2021-08-19 DIAGNOSIS — Z7901 Long term (current) use of anticoagulants: Secondary | ICD-10-CM | POA: Diagnosis not present

## 2021-08-19 DIAGNOSIS — I482 Chronic atrial fibrillation, unspecified: Secondary | ICD-10-CM | POA: Diagnosis not present

## 2021-08-19 LAB — POCT INR: INR: 2.8 (ref 2.0–3.0)

## 2021-08-19 NOTE — Patient Instructions (Signed)
Continue 1 tablet every day EXCEPT 1/2 TABLET ON WEDNESDAY. - recheck INR 7 weeks

## 2021-09-02 ENCOUNTER — Ambulatory Visit (INDEPENDENT_AMBULATORY_CARE_PROVIDER_SITE_OTHER): Payer: Medicare Other | Admitting: Internal Medicine

## 2021-09-02 ENCOUNTER — Encounter: Payer: Self-pay | Admitting: Internal Medicine

## 2021-09-02 VITALS — BP 118/82 | HR 69 | Temp 98.9°F | Ht 70.98 in | Wt 266.6 lb

## 2021-09-02 DIAGNOSIS — F1010 Alcohol abuse, uncomplicated: Secondary | ICD-10-CM

## 2021-09-02 DIAGNOSIS — E7800 Pure hypercholesterolemia, unspecified: Secondary | ICD-10-CM | POA: Diagnosis not present

## 2021-09-02 DIAGNOSIS — Z6839 Body mass index (BMI) 39.0-39.9, adult: Secondary | ICD-10-CM

## 2021-09-02 DIAGNOSIS — I4891 Unspecified atrial fibrillation: Secondary | ICD-10-CM | POA: Diagnosis not present

## 2021-09-02 NOTE — Progress Notes (Signed)
BP 118/82   Pulse 69   Temp 98.9 F (37.2 C) (Oral)   Ht 5' 10.98" (1.803 m)   Wt 266 lb 9.6 oz (120.9 kg)   SpO2 97%   BMI 37.20 kg/m    Subjective:    Patient ID: Travis Diaz, male    DOB: 1956/03/11, 66 y.o.   MRN: [id number]  Chief Complaint  Patient presents with   Atrial Fibrillation   Anemia   Hyperlipidemia   Hypertension    HPI: Travis Diaz is a 66 y.o. male  Atrial Fibrillation Presents for follow-up visit. Symptoms include hypertension. Symptoms are negative for an AICD problem, chest pain, palpitations and shortness of breath. Past medical history includes atrial fibrillation and hyperlipidemia.  Anemia Presents for follow-up visit. There has been no malaise/fatigue or palpitations.  Hyperlipidemia This is a chronic problem. The current episode started more than 1 year ago. Pertinent negatives include no chest pain or shortness of breath.  Hypertension This is a chronic problem. The current episode started more than 1 year ago. The problem is controlled. Pertinent negatives include no anxiety, blurred vision, chest pain, headaches, malaise/fatigue, neck pain, orthopnea, palpitations, peripheral edema, PND, shortness of breath or sweats.   Chief Complaint  Patient presents with   Atrial Fibrillation   Anemia   Hyperlipidemia   Hypertension    Relevant past medical, surgical, family and social history reviewed and updated as indicated. Interim medical history since our last visit reviewed. Allergies and medications reviewed and updated.  Review of Systems  Constitutional:  Negative for malaise/fatigue.  Eyes:  Negative for blurred vision.  Respiratory:  Negative for shortness of breath.   Cardiovascular:  Negative for chest pain, palpitations, orthopnea and PND.  Musculoskeletal:  Negative for neck pain.  Neurological:  Negative for headaches.   Per HPI unless specifically indicated above     Objective:    BP 118/82   Pulse 69    Temp 98.9 F (37.2 C) (Oral)   Ht 5' 10.98" (1.803 m)   Wt 266 lb 9.6 oz (120.9 kg)   SpO2 97%   BMI 37.20 kg/m   Wt Readings from Last 3 Encounters:  09/02/21 266 lb 9.6 oz (120.9 kg)  08/06/21 271 lb 2 oz (123 kg)  07/22/21 269 lb 6.4 oz (122.2 kg)    Physical Exam Vitals and nursing note reviewed.  Constitutional:      General: He is not in acute distress.    Appearance: Normal appearance. He is not ill-appearing or diaphoretic.  HENT:     Head: Normocephalic and atraumatic.     Right Ear: Tympanic membrane and external ear normal. There is no impacted cerumen.     Left Ear: External ear normal.     Nose: No congestion or rhinorrhea.     Mouth/Throat:     Pharynx: No oropharyngeal exudate or posterior oropharyngeal erythema.  Eyes:     Conjunctiva/sclera: Conjunctivae normal.     Pupils: Pupils are equal, round, and reactive to light.  Cardiovascular:     Rate and Rhythm: Normal rate and regular rhythm.     Heart sounds: No murmur heard.   No friction rub. No gallop.  Pulmonary:     Effort: No respiratory distress.     Breath sounds: No stridor. No wheezing or rhonchi.  Chest:     Chest wall: No tenderness.  Abdominal:     General: Abdomen is flat. Bowel sounds are normal.     Palpations:  Abdomen is soft. There is no mass.     Tenderness: There is no abdominal tenderness.  Musculoskeletal:     Cervical back: Normal range of motion and neck supple. No rigidity or tenderness.     Left lower leg: No edema.  Skin:    General: Skin is warm and dry.  Neurological:     Mental Status: He is alert.    Results for orders placed or performed in visit on 08/19/21  POCT INR  Result Value Ref Range   INR 2.8 2.0 - 3.0        Current Outpatient Medications:    allopurinol (ZYLOPRIM) 300 MG tablet, Take 1 tablet (300 mg total) by mouth daily. Needs appt for further refills, Disp: 90 tablet, Rfl: 2   benazepril (LOTENSIN) 40 MG tablet, Take 40 mg by mouth daily., Disp: ,  Rfl:    carvedilol (COREG) 6.25 MG tablet, Take 1 tablet (6.25 mg total) by mouth 2 (two) times daily., Disp: 180 tablet, Rfl: 3   Flaxseed, Linseed, (FLAXSEED OIL) 1200 MG CAPS, Take 1,000 mg by mouth daily. , Disp: , Rfl:    magnesium 30 MG tablet, Take 30 mg by mouth 2 (two) times daily., Disp: , Rfl:    warfarin (COUMADIN) 5 MG tablet, Take 1 tablet (5 mg total) by mouth as directed. Take as directed by the anti-coag clinic., Disp: 30 tablet, Rfl: 1   pantoprazole (PROTONIX) 40 MG tablet, Take 1 tablet (40 mg total) by mouth 2 (two) times daily., Disp: 180 tablet, Rfl: 0    Assessment & Plan:  Afib is on coumadin now started over a year now.  Stable, chronic.   Cirrhosis - still drinks fridays and sundays about 14 a day.  Seeing Gi for such Increased hepatic parenchymal echogenicity with a mildly nodular surface contour suggesting cirrhosis. No focal lesions identified. PT RELUCTANT to fu with GI, he is in denial about his ETOH abuse.  Reiterated he needs to fu with GI specially if he had any further Gi bleeds  Obesity Lifestyle modifications advised to pt. BMI - 37.20   Portion control and avoiding high carb low fat diet advised.  Diet plan given to pt   exercise plan given and encouraged.  To increase exercise to 150 mins a week ie 21/2 hours a week. Pt verbalises understanding of the above.   Problem List Items Addressed This Visit       Cardiovascular and Mediastinum   Atrial fibrillation ([place]) - Primary     Other   Obesity   ETOH abuse   Pure hypercholesterolemia     No orders of the defined types were placed in this encounter.    No orders of the defined types were placed in this encounter.    Follow up plan: Return in about 4 months (around 01/02/2022).

## 2021-09-17 ENCOUNTER — Ambulatory Visit (INDEPENDENT_AMBULATORY_CARE_PROVIDER_SITE_OTHER): Payer: Medicare Other | Admitting: *Deleted

## 2021-09-17 DIAGNOSIS — Z1211 Encounter for screening for malignant neoplasm of colon: Secondary | ICD-10-CM | POA: Diagnosis not present

## 2021-09-17 DIAGNOSIS — Z0000 Encounter for general adult medical examination without abnormal findings: Secondary | ICD-10-CM

## 2021-09-17 NOTE — Patient Instructions (Signed)
Travis Diaz , Thank you for taking time to come for your Medicare Wellness Visit. I appreciate your ongoing commitment to your health goals. Please review the following plan we discussed and let me know if I can assist you in the future.   Screening recommendations/referrals: Colonoscopy: Education provided Recommended yearly ophthalmology/optometry visit for glaucoma screening and checkup Recommended yearly dental visit for hygiene and checkup  Vaccinations: Influenza vaccine: Education provided Pneumococcal vaccine: Education provided Tdap vaccine: up to date Shingles vaccine: Education provided    Advanced directives: Education provided  Conditions/risks identified:   Next appointment: 01-05-2022 @ 8:00  [place]  Preventive Care 29 Years and Older, Male Preventive care refers to lifestyle choices and visits with your health care provider that can promote health and wellness. What does preventive care include? A yearly physical exam. This is also called an annual well check. Dental exams once or twice a year. Routine eye exams. Ask your health care provider how often you should have your eyes checked. Personal lifestyle choices, including: Daily care of your teeth and gums. Regular physical activity. Eating a healthy diet. Avoiding tobacco and drug use. Limiting alcohol use. Practicing safe sex. Taking low doses of aspirin every day. Taking vitamin and mineral supplements as recommended by your health care provider. What happens during an annual well check? The services and screenings done by your health care provider during your annual well check will depend on your age, overall health, lifestyle risk factors, and family history of disease. Counseling  Your health care provider may ask you questions about your: Alcohol use. Tobacco use. Drug use. Emotional well-being. Home and relationship well-being. Sexual activity. Eating habits. History of falls. Memory and  ability to understand (cognition). Work and work Statistician. Screening  You may have the following tests or measurements: Height, weight, and BMI. Blood pressure. Lipid and cholesterol levels. These may be checked every 5 years, or more frequently if you are over 38 years old. Skin check. Lung cancer screening. You may have this screening every year starting at age 67 if you have a 30-pack-year history of smoking and currently smoke or have quit within the past 15 years. Fecal occult blood test (FOBT) of the stool. You may have this test every year starting at age 67. Flexible sigmoidoscopy or colonoscopy. You may have a sigmoidoscopy every 5 years or a colonoscopy every 10 years starting at age 67. Prostate cancer screening. Recommendations will vary depending on your family history and other risks. Hepatitis C blood test. Hepatitis B blood test. Sexually transmitted disease (STD) testing. Diabetes screening. This is done by checking your blood sugar (glucose) after you have not eaten for a while (fasting). You may have this done every 1-3 years. Abdominal aortic aneurysm (AAA) screening. You may need this if you are a current or former smoker. Osteoporosis. You may be screened starting at age 67 if you are at high risk. Talk with your health care provider about your test results, treatment options, and if necessary, the need for more tests. Vaccines  Your health care provider may recommend certain vaccines, such as: Influenza vaccine. This is recommended every year. Tetanus, diphtheria, and acellular pertussis (Tdap, Td) vaccine. You may need a Td booster every 10 years. Zoster vaccine. You may need this after age 67. Pneumococcal 13-valent conjugate (PCV13) vaccine. One dose is recommended after age 85. Pneumococcal polysaccharide (PPSV23) vaccine. One dose is recommended after age 55. Talk to your health care provider about which screenings and vaccines you need  and how often you need  them. This information is not intended to replace advice given to you by your health care provider. Make sure you discuss any questions you have with your health care provider. Document Released: 04/11/2015 Document Revised: 12/03/2015 Document Reviewed: 01/14/2015 Elsevier Interactive Patient Education  2017 [place] Prevention in the Home Falls can cause injuries. They can happen to people of all ages. There are many things you can do to make your home safe and to help prevent falls. What can I do on the outside of my home? Regularly fix the edges of walkways and driveways and fix any cracks. Remove anything that might make you trip as you walk through a door, such as a raised step or threshold. Trim any bushes or trees on the path to your home. Use bright outdoor lighting. Clear any walking paths of anything that might make someone trip, such as rocks or tools. Regularly check to see if handrails are loose or broken. Make sure that both sides of any steps have handrails. Any raised decks and porches should have guardrails on the edges. Have any leaves, snow, or ice cleared regularly. Use sand or salt on walking paths during winter. Clean up any spills in your garage right away. This includes oil or grease spills. What can I do in the bathroom? Use night lights. Install grab bars by the toilet and in the tub and shower. Do not use towel bars as grab bars. Use non-skid mats or decals in the tub or shower. If you need to sit down in the shower, use a plastic, non-slip stool. Keep the floor dry. Clean up any water that spills on the floor as soon as it happens. Remove soap buildup in the tub or shower regularly. Attach bath mats securely with double-sided non-slip rug tape. Do not have throw rugs and other things on the floor that can make you trip. What can I do in the bedroom? Use night lights. Make sure that you have a light by your bed that is easy to reach. Do not use  any sheets or blankets that are too big for your bed. They should not hang down onto the floor. Have a firm chair that has side arms. You can use this for support while you get dressed. Do not have throw rugs and other things on the floor that can make you trip. What can I do in the kitchen? Clean up any spills right away. Avoid walking on wet floors. Keep items that you use a lot in easy-to-reach places. If you need to reach something above you, use a strong step stool that has a grab bar. Keep electrical cords out of the way. Do not use floor polish or wax that makes floors slippery. If you must use wax, use non-skid floor wax. Do not have throw rugs and other things on the floor that can make you trip. What can I do with my stairs? Do not leave any items on the stairs. Make sure that there are handrails on both sides of the stairs and use them. Fix handrails that are broken or loose. Make sure that handrails are as long as the stairways. Check any carpeting to make sure that it is firmly attached to the stairs. Fix any carpet that is loose or worn. Avoid having throw rugs at the top or bottom of the stairs. If you do have throw rugs, attach them to the floor with carpet tape. Make sure that you  have a light switch at the top of the stairs and the bottom of the stairs. If you do not have them, ask someone to add them for you. What else can I do to help prevent falls? Wear shoes that: Do not have high heels. Have rubber bottoms. Are comfortable and fit you well. Are closed at the toe. Do not wear sandals. If you use a stepladder: Make sure that it is fully opened. Do not climb a closed stepladder. Make sure that both sides of the stepladder are locked into place. Ask someone to hold it for you, if possible. Clearly mark and make sure that you can see: Any grab bars or handrails. First and last steps. Where the edge of each step is. Use tools that help you move around (mobility aids)  if they are needed. These include: Canes. Walkers. Scooters. Crutches. Turn on the lights when you go into a dark area. Replace any light bulbs as soon as they burn out. Set up your furniture so you have a clear path. Avoid moving your furniture around. If any of your floors are uneven, fix them. If there are any pets around you, be aware of where they are. Review your medicines with your doctor. Some medicines can make you feel dizzy. This can increase your chance of falling. Ask your doctor what other things that you can do to help prevent falls. This information is not intended to replace advice given to you by your health care provider. Make sure you discuss any questions you have with your health care provider. Document Released: 01/09/2009 Document Revised: 08/21/2015 Document Reviewed: 04/19/2014 Elsevier Interactive Patient Education  2017 [employer].

## 2021-10-07 ENCOUNTER — Ambulatory Visit (INDEPENDENT_AMBULATORY_CARE_PROVIDER_SITE_OTHER): Payer: Medicare Other

## 2021-10-07 ENCOUNTER — Telehealth: Payer: Self-pay | Admitting: Cardiovascular Disease

## 2021-10-07 ENCOUNTER — Other Ambulatory Visit: Payer: Self-pay

## 2021-10-07 ENCOUNTER — Other Ambulatory Visit: Payer: Self-pay | Admitting: Cardiovascular Disease

## 2021-10-07 DIAGNOSIS — I482 Chronic atrial fibrillation, unspecified: Secondary | ICD-10-CM | POA: Diagnosis not present

## 2021-10-07 DIAGNOSIS — M1000 Idiopathic gout, unspecified site: Secondary | ICD-10-CM

## 2021-10-07 DIAGNOSIS — Z7901 Long term (current) use of anticoagulants: Secondary | ICD-10-CM

## 2021-10-07 LAB — POCT INR: INR: 3.2 — AB (ref 2.0–3.0)

## 2021-10-07 MED ORDER — WARFARIN SODIUM 5 MG PO TABS: 5.0000 mg | ORAL_TABLET | ORAL | 5 refills | Status: DC

## 2021-10-07 MED ORDER — ALLOPURINOL 300 MG PO TABS: 300.0000 mg | ORAL_TABLET | Freq: Every day | ORAL | 0 refills | Status: DC

## 2021-10-07 MED ORDER — BENAZEPRIL HCL 40 MG PO TABS: 40.0000 mg | ORAL_TABLET | Freq: Every day | ORAL | 0 refills | Status: DC

## 2021-10-07 NOTE — Telephone Encounter (Signed)
Refill request

## 2021-10-07 NOTE — Patient Instructions (Signed)
Description   Skip tomorrow's dosage of Warfarin, then resume same dosage of Warfarin 1 tablet every day EXCEPT 1/2 TABLET ON WEDNESDAYS. Recheck INR 5 weeks

## 2021-10-07 NOTE — Telephone Encounter (Signed)
*  STAT* If patient is at the pharmacy, call can be transferred to refill team.   1. Which medications need to be refilled? (please list name of each medication and dose if known) warfarin (COUMADIN) 5 MG tablet  2. Which pharmacy/location (including street and city if local pharmacy) is medication to be sent to? [place], [place] - [address]  3. Do they need a 30 day or 90 day supply? [place]

## 2021-10-07 NOTE — Telephone Encounter (Signed)
Received refill request for warfarin:  Last INR was 3.2 on 10/07/21 Next INR due on 11/11/21 LOV was 08/06/21  Rod Can MD  Refill approved.

## 2021-10-09 ENCOUNTER — Other Ambulatory Visit: Payer: Self-pay | Admitting: *Deleted

## 2021-10-09 MED ORDER — WARFARIN SODIUM 5 MG PO TABS: ORAL_TABLET | ORAL | 2 refills | Status: DC

## 2021-10-09 NOTE — Telephone Encounter (Signed)
Prescription refill request received for warfarin  Lov: Arida, 08/06/2021 Next INR check: 8/16 Warfarin tablet strength: '5mg'$    Refill sent.

## 2021-10-23 ENCOUNTER — Encounter: Payer: Self-pay | Admitting: Family

## 2021-11-11 ENCOUNTER — Ambulatory Visit (INDEPENDENT_AMBULATORY_CARE_PROVIDER_SITE_OTHER): Payer: Medicare Other

## 2021-11-11 DIAGNOSIS — I482 Chronic atrial fibrillation, unspecified: Secondary | ICD-10-CM | POA: Diagnosis not present

## 2021-11-11 DIAGNOSIS — Z7901 Long term (current) use of anticoagulants: Secondary | ICD-10-CM | POA: Diagnosis not present

## 2021-11-11 LAB — POCT INR: INR: 1.6 — AB (ref 2.0–3.0)

## 2021-11-11 NOTE — Patient Instructions (Signed)
TOOK 1 TABLET TODAY;  then resume same dosage of Warfarin 1 tablet every day EXCEPT 1/2 TABLET ON WEDNESDAYS. Recheck INR 3 weeks

## 2021-12-01 ENCOUNTER — Ambulatory Visit: Payer: Self-pay

## 2021-12-01 NOTE — Patient Instructions (Signed)
Visit Information  Thank you for taking time to visit with me today. Please don't hesitate to contact me if I can be of assistance to you.   Following are the goals we discussed today:   Goals Addressed             This Visit's Progress    RNCM: "Everything is fine"       Care Coordination Interventions: Evaluation of current treatment plan related to chronic conditions, specifically AFIB and patient's adherence to plan as established by provider Advised patient to call the [facility] for changes in conditions, questions, or concerns. The patient states he is stable and he does not need any assistance at this time. Review of the goals of the program. The patient feels he is stable at this time and does not need any assistance with his care.  Provided education to patient re: how to reach the [facility] and to call the provider for changes in his health and well being Reviewed scheduled/upcoming provider appointments including 01-07-2022 with pcp  Advised patient to discuss changes in his health and well being with provider Screening for signs and symptoms of depression related to chronic disease state  Assessed social determinant of health barriers review of electric, water, and utilities and the patient has no needs. Other SDOH reviewed in June 2023             Please call the care guide team at [phone] if you need to schedule an appointment.   If you are experiencing a Mental Health or [place] or need someone to talk to, please call the Suicide and Crisis Lifeline: 988 call the [place] National Suicide Prevention Lifeline: 3075055488 or TTY: 231-226-3788 TTY 863-770-0787) to talk to a trained counselor call 1-800-273-TALK (toll free, 24 hour hotline)  The patient verbalized understanding of instructions, educational materials, and care plan provided today and DECLINED offer to receive copy of patient instructions, educational materials, and care plan.   No further  follow up required: the patient denies any needs at this time. Knows how to reach the [place] RN, MSN, [employer] [facility] Mobile: [phone]

## 2021-12-01 NOTE — Patient Outreach (Signed)
  Care Coordination   Initial Visit Note   12/01/2021 Name: Travis Diaz MRN: [id number] DOB: 1956-01-26  Travis Diaz is a 66 y.o. year old male who sees [facility] [place] for primary care. I spoke with  Travis Diaz by phone today.  What matters to the patients health and wellness today?  The patient states he is doing well and has no new concerns at this time. Provided information on the program and the goals of the program    Goals Addressed             This Visit's Progress    RNCM: "Everything is fine"       Care Coordination Interventions: Evaluation of current treatment plan related to chronic conditions, specifically AFIB and patient's adherence to plan as established by provider Advised patient to call the [facility] for changes in conditions, questions, or concerns. The patient states he is stable and he does not need any assistance at this time. Review of the goals of the program. The patient feels he is stable at this time and does not need any assistance with his care.  Provided education to patient re: how to reach the [facility] and to call the provider for changes in his health and well being Reviewed scheduled/upcoming provider appointments including 01-07-2022 with pcp  Advised patient to discuss changes in his health and well being with provider Screening for signs and symptoms of depression related to chronic disease state  Assessed social determinant of health barriers review of electric, water, and utilities and the patient has no needs. Other SDOH reviewed in June 2023           SDOH assessments and interventions completed:  Yes  SDOH Interventions Today    Flowsheet Row Most Recent Value  SDOH Interventions   Utilities Interventions Intervention Not Indicated        Care Coordination Interventions Activated:  Yes  Care Coordination Interventions:  Yes, provided   Follow up plan: No further intervention required.   Encounter  Outcome:  Pt. Visit Completed   Noreene Larsson RN, MSN, [employer] Mobile: [phone]

## 2021-12-02 ENCOUNTER — Ambulatory Visit: Payer: Medicare Other | Attending: Cardiovascular Disease

## 2021-12-02 DIAGNOSIS — Z7901 Long term (current) use of anticoagulants: Secondary | ICD-10-CM | POA: Diagnosis not present

## 2021-12-02 DIAGNOSIS — I482 Chronic atrial fibrillation, unspecified: Secondary | ICD-10-CM | POA: Diagnosis not present

## 2021-12-02 LAB — POCT INR: INR: 5.3 — AB (ref 2.0–3.0)

## 2021-12-02 NOTE — Patient Instructions (Signed)
HOLD THURSDAY, FRIDAY and SATURDAY  then resume same dosage of Warfarin 1 tablet every day EXCEPT 1/2 TABLET ON WEDNESDAYS. Recheck INR 2 weeks

## 2021-12-16 ENCOUNTER — Ambulatory Visit: Payer: Medicare Other

## 2021-12-30 ENCOUNTER — Ambulatory Visit: Payer: Medicare Other | Attending: Cardiovascular Disease

## 2021-12-30 DIAGNOSIS — I482 Chronic atrial fibrillation, unspecified: Secondary | ICD-10-CM | POA: Diagnosis not present

## 2021-12-30 DIAGNOSIS — Z5181 Encounter for therapeutic drug level monitoring: Secondary | ICD-10-CM | POA: Diagnosis not present

## 2021-12-30 LAB — POCT INR: INR: 2.1 (ref 2.0–3.0)

## 2021-12-30 NOTE — Patient Instructions (Signed)
resume same dosage of Warfarin 1 tablet every day EXCEPT 1/2 TABLET ON WEDNESDAYS. Recheck INR 6 weeks

## 2022-01-05 ENCOUNTER — Ambulatory Visit: Payer: Medicare Other | Admitting: Unknown Physician Specialty

## 2022-01-06 ENCOUNTER — Telehealth: Payer: Self-pay | Admitting: Nurse Practitioner

## 2022-01-06 DIAGNOSIS — M1000 Idiopathic gout, unspecified site: Secondary | ICD-10-CM

## 2022-01-06 MED ORDER — ALLOPURINOL 300 MG PO TABS: 300.0000 mg | ORAL_TABLET | Freq: Every day | ORAL | 0 refills | Status: DC

## 2022-01-06 MED ORDER — BENAZEPRIL HCL 40 MG PO TABS: 40.0000 mg | ORAL_TABLET | Freq: Every day | ORAL | 0 refills | Status: DC

## 2022-01-06 NOTE — Telephone Encounter (Signed)
Patient has been notified

## 2022-01-06 NOTE — Telephone Encounter (Signed)
Pt req refill for Allopurinal and Benazepril

## 2022-01-06 NOTE — Telephone Encounter (Signed)
PT came in stating that he was going to run out of his medications prior to his appointment on 01/21/2022.  Michela Pitcher that he has enough probably til Tuesday of next week.  Originally he had 2 appointments at which both of them was cancelled.  The appointments were on 10/10 and 10/12 because they were with provider out of the office.  Please advise.

## 2022-01-06 NOTE — Telephone Encounter (Signed)
Medications sent to the pharmacy.

## 2022-01-07 ENCOUNTER — Ambulatory Visit: Payer: Medicare Other | Admitting: Unknown Physician Specialty

## 2022-01-20 ENCOUNTER — Telehealth: Payer: Self-pay | Admitting: Cardiovascular Disease

## 2022-01-20 ENCOUNTER — Encounter: Payer: Self-pay | Admitting: Nurse Practitioner

## 2022-01-20 NOTE — Telephone Encounter (Signed)
   Patient Name: Travis Diaz [facility]  DOB: [birth year] MRN: [id number]  Primary Cardiologist: None  Chart reviewed as part of pre-operative protocol coverage. Pharmacy clearance only. Per office protocols and pharmacist review  Antionio Negron Krenzer may hold Warfarin 5 days prior to planned procedure. He will not need bridging with Lovenox.  I will route this recommendation to the requesting party via Epic fax function and remove from pre-op pool.  Please call with questions.  Loel Dubonnet, NP 01/20/2022, 3:46 PM

## 2022-01-20 NOTE — Telephone Encounter (Signed)
Patient on Warfarin due to chronic atrial fibrillation.   Will route to pharmacy team for recommendations regarding Warfarin prior to 04/23/22 colonoscopy.   CHA2DS2-VASc Score = 2   This indicates a 2.2% annual risk of stroke. The patient's score is based upon: CHF History: 0 HTN History: 1 Diabetes History: 0 Stroke History: 0 Vascular Disease History: 0 Age Score: 1 Gender Score: 0

## 2022-01-20 NOTE — Telephone Encounter (Signed)
   Pre-operative Risk Assessment    Patient Name: Travis Diaz [facility]  DOB: [birth year] MRN: [id number]      Request for Surgical Clearance    Procedure:   Colonoscopy   Date of Surgery:  Clearance 04/23/22                                 Surgeon:  Dr Volney [facility] or Practice Name:  [facility] Phone number:  [phone] Fax number:  [phone]   Type of Clearance Requested:   - Pharmacy:  Hold Warfarin (Coumadin) instructions   Type of Anesthesia:  Not Indicated   Additional requests/questions:    Manfred Arch   01/20/2022, 3:02 PM

## 2022-01-20 NOTE — Progress Notes (Deleted)
   There were no vitals taken for this visit.   Subjective:    Patient ID: Travis Diaz, male    DOB: 1956/03/05, 66 y.o.   MRN: [id number]  HPI: Travis Diaz is a 66 y.o. male  No chief complaint on file.  HYPERTENSION / HYPERLIPIDEMIA Satisfied with current treatment? {Blank single:19197::"yes","no"} Duration of hypertension: {Blank single:19197::"chronic","months","years"} BP monitoring frequency: {Blank single:19197::"not checking","rarely","daily","weekly","monthly","a few times a day","a few times a week","a few times a month"} BP range:  BP medication side effects: {Blank single:19197::"yes","no"} Past BP meds: {Blank VXBLTJQZ:00923::"RAQT","MAUQJFHLKT","GYBWLSLHTD/SKAJGOTLXB","WIOMBTDH","RCBULAGTXM","IWOEHOZYYQ/MGNO","IBBCWUGQBV (bystolic)","carvedilol","chlorthalidone","clonidine","diltiazem","exforge HCT","HCTZ","irbesartan (avapro)","labetalol","lisinopril","lisinopril-HCTZ","losartan (cozaar)","methyldopa","nifedipine","olmesartan (benicar)","olmesartan-HCTZ","quinapril","ramipril","spironalactone","tekturna","valsartan","valsartan-HCTZ","verapamil"} Duration of hyperlipidemia: {Blank single:19197::"chronic","months","years"} Cholesterol medication side effects: {Blank single:19197::"yes","no"} Cholesterol supplements: {Blank multiple:19196::"none","fish oil","niacin","red yeast rice"} Past cholesterol medications: {Blank multiple:19196::"none","atorvastain (lipitor)","lovastatin (mevacor)","pravastatin (pravachol)","rosuvastatin (crestor)","simvastatin (zocor)","vytorin","fenofibrate (tricor)","gemfibrozil","ezetimide (zetia)","niaspan","lovaza"} Medication compliance: {Blank single:19197::"excellent compliance","good compliance","fair compliance","poor compliance"} Aspirin: {Blank single:19197::"yes","no"} Recent stressors: {Blank single:19197::"yes","no"} Recurrent headaches: {Blank single:19197::"yes","no"} Visual changes: {Blank  single:19197::"yes","no"} Palpitations: {Blank single:19197::"yes","no"} Dyspnea: {Blank single:19197::"yes","no"} Chest pain: {Blank single:19197::"yes","no"} Lower extremity edema: {Blank single:19197::"yes","no"} Dizzy/lightheaded: {Blank single:19197::"yes","no"}  ALCOHOL USE   Relevant past medical, surgical, family and social history reviewed and updated as indicated. Interim medical history since our last visit reviewed. Allergies and medications reviewed and updated.  Review of Systems  Per HPI unless specifically indicated above     Objective:    There were no vitals taken for this visit.  Wt Readings from Last 3 Encounters:  09/02/21 266 lb 9.6 oz (120.9 kg)  08/06/21 271 lb 2 oz (123 kg)  07/22/21 269 lb 6.4 oz (122.2 kg)    Physical Exam  Results for orders placed or performed in visit on 12/30/21  POCT INR  Result Value Ref Range   INR 2.1 2.0 - 3.0      Assessment & Plan:   Problem List Items Addressed This Visit       Cardiovascular and Mediastinum   Hypertension   Atrial fibrillation ([place]) - Primary     Other   ETOH abuse   Pure hypercholesterolemia     Follow up plan: No follow-ups on file.

## 2022-01-20 NOTE — Telephone Encounter (Signed)
Patient with diagnosis of A Fib on warfarin for anticoagulation.    Procedure: colonoscopy Date of procedure: Clearance 04/23/22                                CHA2DS2-VASc Score = 2  This indicates a 2.2% annual risk of stroke. The patient's score is based upon: CHF History: 0 HTN History: 1 Diabetes History: 0 Stroke History: 0 Vascular Disease History: 0 Age Score: 1 Gender Score: 0   CrCl 78 mL/min using adj body weight Platelet count 315K   Per office protocol, patient can hold warfarin  for 5 days prior to procedure.    Patient will not need bridging with Lovenox (enoxaparin) around procedure.  **This guidance is not considered finalized until pre-operative APP has relayed final recommendations.**

## 2022-01-21 ENCOUNTER — Ambulatory Visit: Payer: Medicare Other | Admitting: Nurse Practitioner

## 2022-01-21 ENCOUNTER — Ambulatory Visit (INDEPENDENT_AMBULATORY_CARE_PROVIDER_SITE_OTHER): Payer: Medicare Other | Admitting: Physician Assistant

## 2022-01-21 ENCOUNTER — Encounter: Payer: Self-pay | Admitting: Physician Assistant

## 2022-01-21 VITALS — BP 141/88 | HR 50 | Temp 98.5°F | Wt 268.2 lb

## 2022-01-21 DIAGNOSIS — Z13220 Encounter for screening for lipoid disorders: Secondary | ICD-10-CM

## 2022-01-21 DIAGNOSIS — F1010 Alcohol abuse, uncomplicated: Secondary | ICD-10-CM | POA: Diagnosis not present

## 2022-01-21 DIAGNOSIS — I4891 Unspecified atrial fibrillation: Secondary | ICD-10-CM | POA: Diagnosis not present

## 2022-01-21 DIAGNOSIS — I10 Essential (primary) hypertension: Secondary | ICD-10-CM

## 2022-01-21 DIAGNOSIS — E7800 Pure hypercholesterolemia, unspecified: Secondary | ICD-10-CM

## 2022-01-21 DIAGNOSIS — Z1329 Encounter for screening for other suspected endocrine disorder: Secondary | ICD-10-CM

## 2022-01-21 NOTE — Assessment & Plan Note (Signed)
Chronic, ongoing Reports he is drinking 16 beers each day of the weekend  Discussed trying to cut back on this but patient declined citing stress relief of drinking Will continue to monitor Will check B12 and thiamine levels today with labs Follow up in 6 months or sooner if concerns arise

## 2022-01-21 NOTE — Assessment & Plan Note (Signed)
Chronic, ongoing He is taking Carvedilol 6.25 mg PO BID to assist with HR control along with Warfarin 5 mg (half to whole tablet per day) as directed by Coumadin clinic Denies concerns for bleeding or easy bruising today Continue regular follow up with Warfarin clinic for dosing management Follow up regularly with Cardiology Recommend follow up in PCP office in 6 months or sooner if concerns arise

## 2022-01-21 NOTE — Patient Instructions (Signed)
Your blood pressure was mildly elevated today.  If possible please take it at home using an electronic blood pressure cuff for the upper arm Record your blood pressure once per day and bring them back with you to your apt so we can make sure you are not developing high blood pressure.   Incorporating a minimum of 150 minutes (20-30 minutes per day) of moderate intensity physical activity can help improve your heart health and reduce the chances of high blood pressure and other cardiovascular risks. Incorporating a heart healthy diet can also help reduce the chances of heart attack and high cholesterol.

## 2022-01-21 NOTE — Assessment & Plan Note (Signed)
Chronic, ongoing concern Discussed home readings which are in goal - reports he had to rush for apt this AM He is currently taking Benazepril 40 mg PO QD and Carvedilol 6.25 mg PO BID and appears to be tolerating well Continue current regimen and regular follow up with Cardiology Follow up in 6 months for monitoring

## 2022-01-21 NOTE — Progress Notes (Signed)
Established Patient Office Visit  Name: Travis Diaz   MRN: [id number]    DOB: [birth year]   Date:01/21/2022  Today's Provider: Talitha Givens, MHS, PA-C Introduced myself to the patient as a PA-C and provided education on APPs in clinical practice.         Subjective  Chief Complaint  Chief Complaint  Patient presents with   Hypertension   Hyperlipidemia   Diabetes    Follow up     HPI   HYPERTENSION / [place] Satisfied with current treatment? yes Duration of hypertension: years BP monitoring frequency: rarely BP range: 116-122/80s BP medication side effects: no Past BP meds: benazepril and carvedilol Duration of hyperlipidemia: years Cholesterol medication side effects: no Cholesterol supplements:  Flax seed oil Past cholesterol medications: none Medication compliance:  Not taking medications  Aspirin: no Recent stressors: no Recurrent headaches: no Visual changes: no Palpitations: no Dyspnea: no Chest pain: no Lower extremity edema: no Dizzy/lightheaded: yes sometimes with standing    ATRIAL FIBRILLATION Atrial fibrillation status: stable Satisfied with current treatment: yes  Medication side effects:  no Medication compliance: excellent compliance Etiology of atrial fibrillation: Unsure  Palpitations:  no Chest pain:  no Dyspnea on exertion:  no Orthopnea:  no Syncope:  no Edema:  no Ventricular rate control: B-blocker Anti-coagulation: warfarin- monitored by Cardiology  States in the winter his nose will sometimes bleed from the cold   Drinks about 16 beers on Sat and 16 on Sunday  Tries to stay away from it on weekdays Reports he has given up liquor He is not interested in reducing consumption at this time - states this helps with his stress   Patient Active Problem List   Diagnosis Date Noted   Screening for lipid disorders 07/22/2021   Pure hypercholesterolemia 03/11/2021   Long term (current) use of anticoagulants  02/25/2021   ETOH abuse 01/22/2021   Atrial fibrillation ([place]) 02/12/2020   Obesity 08/10/2019   Arthritis of both knees 01/09/2015   Hypertension 11/27/2014   Acute gout 09/20/2014    Past Surgical History:  Procedure Laterality Date   broken arm     COLONOSCOPY WITH PROPOFOL N/A 05/14/2016   Procedure: COLONOSCOPY WITH PROPOFOL;  Surgeon: Manya Silvas, MD;  Location: [facility] ENDOSCOPY;  Service: Endoscopy;  Laterality: N/A;   ESOPHAGOGASTRODUODENOSCOPY (EGD) WITH PROPOFOL N/A 05/14/2021   Procedure: ESOPHAGOGASTRODUODENOSCOPY (EGD) WITH PROPOFOL;  Surgeon: [name], DO;  Location: [place];  Service: Gastroenterology;  Laterality: N/A;   upper leg procedure Right    removal of blood clots    Family History  Problem Relation Age of Onset   Cancer Mother        male   Heart disease Father    Cancer Father    Early death Father    Heart attack Father    Diabetes Sister    Dementia Maternal Grandmother    Heart attack Maternal Grandfather    Heart attack Paternal Grandmother    Early death Paternal Grandfather    Heart attack Paternal Grandfather     Social History   Tobacco Use   Smoking status: Former    Types: Cigarettes    Quit date: 03/29/2004    Years since quitting: 17.8   Smokeless tobacco: Never  Substance Use Topics   Alcohol use: Yes    Alcohol/week: 24.0 standard drinks of alcohol    Types: 24 Cans of beer per week    Comment: 1/2 gallon  of liquor and 1 case of beer per week     Current Outpatient Medications:    allopurinol (ZYLOPRIM) 300 MG tablet, Take 1 tablet (300 mg total) by mouth daily. Needs appt for further refills, Disp: 90 tablet, Rfl: 0   benazepril (LOTENSIN) 40 MG tablet, Take 1 tablet (40 mg total) by mouth daily., Disp: 90 tablet, Rfl: 0   carvedilol (COREG) 6.25 MG tablet, Take 1 tablet (6.25 mg total) by mouth 2 (two) times daily., Disp: 180 tablet, Rfl: 3   Flaxseed, Linseed, (FLAXSEED OIL) 1200 MG CAPS, Take  1,000 mg by mouth daily. , Disp: , Rfl:    warfarin (COUMADIN) 5 MG tablet, Take 1/2 a tablet to 1 tablet by mouth daily as directed by the coumadin clinic., Disp: 35 tablet, Rfl: 2   magnesium 30 MG tablet, Take 30 mg by mouth 2 (two) times daily. (Patient not taking: Reported on 01/21/2022), Disp: , Rfl:    pantoprazole (PROTONIX) 40 MG tablet, Take 1 tablet (40 mg total) by mouth 2 (two) times daily., Disp: 180 tablet, Rfl: 0  Allergies  Allergen Reactions   Elemental Sulfur Other (See Comments)    Was told by his mother    I personally reviewed active problem list, medication list, allergies, health maintenance, notes from last encounter, lab results with the patient/caregiver today.   Review of Systems  Constitutional:  Negative for chills, fever and malaise/fatigue.  Eyes:  Negative for blurred vision and double vision.  Cardiovascular:  Negative for chest pain, palpitations and leg swelling.  Gastrointestinal:  Negative for heartburn.  Musculoskeletal:  Negative for falls.  Skin:  Negative for itching and rash.  Neurological:  Negative for dizziness, loss of consciousness, weakness and headaches.  Endo/Heme/Allergies:  Does not bruise/bleed easily.  Psychiatric/Behavioral:  Negative for depression. The patient is not nervous/anxious.       Objective  Vitals:   01/21/22 0908  BP: (!) 141/88  Pulse: (!) 50  Temp: 98.5 F (36.9 C)  TempSrc: Oral  SpO2: 98%  Weight: 268 lb 3.2 oz (121.7 kg)    Body mass index is 37.42 kg/m.  Physical Exam Vitals reviewed.  Constitutional:      General: He is awake.     Appearance: Normal appearance. He is well-developed and well-groomed. He is obese.  HENT:     Head: Normocephalic and atraumatic.     Mouth/Throat:     Lips: Pink.     Pharynx: Oropharynx is clear. Uvula midline. No oropharyngeal exudate or posterior oropharyngeal erythema.     Tonsils: No tonsillar exudate or tonsillar abscesses.  Eyes:     General: Lids are  normal. Gaze aligned appropriately. No visual field deficit.    Extraocular Movements: Extraocular movements intact.     Conjunctiva/sclera: Conjunctivae normal.     Pupils: Pupils are equal, round, and reactive to light.  Cardiovascular:     Rate and Rhythm: Normal rate and regular rhythm.     Pulses: Normal pulses.          Radial pulses are 2+ on the right side and 2+ on the left side.     Heart sounds: Normal heart sounds. No murmur heard.    No friction rub. No gallop.     Comments: Redness along lower calves and shins- no drainage or broken skin present  Pulmonary:     Effort: Pulmonary effort is normal.     Breath sounds: Normal breath sounds. No decreased air movement. No decreased breath sounds,  wheezing, rhonchi or rales.  Musculoskeletal:     Right lower leg: No edema.     Left lower leg: No edema.  Neurological:     Mental Status: He is alert.  Psychiatric:        Attention and Perception: Attention and perception normal.        Mood and Affect: Mood and affect normal.        Speech: Speech normal.        Behavior: Behavior normal. Behavior is cooperative.      Recent Results (from the past 2160 hour(s))  POCT INR     Status: Abnormal   Collection Time: 11/11/21  9:56 AM  Result Value Ref Range   INR 1.6 (A) 2.0 - 3.0  POCT INR     Status: Abnormal   Collection Time: 12/02/21 10:54 AM  Result Value Ref Range   INR 5.3 (A) 2.0 - 3.0  POCT INR     Status: None   Collection Time: 12/30/21  9:27 AM  Result Value Ref Range   INR 2.1 2.0 - 3.0     PHQ2/9:    01/21/2022    9:09 AM 09/17/2021   12:10 PM 09/02/2021    9:47 AM 07/22/2021    8:49 AM 01/08/2021   10:36 AM  Depression screen PHQ 2/9  Decreased Interest 1 0 0 0 0  Down, Depressed, Hopeless 0 0 0 0 0  PHQ - 2 Score 1 0 0 0 0  Altered sleeping 0 0 0 1 0  Tired, decreased energy 0 0 0 1 0  Change in appetite 0 0 0 0 0  Feeling bad or failure about yourself  0 0 0 0 0  Trouble concentrating 0 0 0 0 0   Moving slowly or fidgety/restless 0 0 0 0 0  Suicidal thoughts 0 0 0 0 0  PHQ-9 Score 1 0 0 2 0  Difficult doing work/chores Not difficult at all Not difficult at all Not difficult at all Not difficult at all Not difficult at all      Fall Risk:    01/21/2022    9:09 AM 09/17/2021   12:06 PM 09/02/2021    9:47 AM 07/22/2021    8:48 AM 03/11/2021   10:00 AM  Fall Risk   Falls in the past year? 0 0 0 0 0  Number falls in past yr: 0 0 0 0 0  Injury with Fall? 0 0 0 0 0  Risk for fall due to : No Fall Risks  No Fall Risks No Fall Risks No Fall Risks  Follow up Falls evaluation completed Falls evaluation completed;Education provided;Falls prevention discussed Falls evaluation completed Falls evaluation completed Falls evaluation completed      Functional Status Survey:      Assessment & Plan  Problem List Items Addressed This Visit       Cardiovascular and Mediastinum   Hypertension    Chronic, ongoing concern Discussed home readings which are in goal - reports he had to rush for apt this AM He is currently taking Benazepril 40 mg PO QD and Carvedilol 6.25 mg PO BID and appears to be tolerating well Continue current regimen and regular follow up with Cardiology Follow up in 6 months for monitoring       Relevant Orders   CBC w/Diff   Atrial fibrillation ([facility]) - Primary    Chronic, ongoing He is taking Carvedilol 6.25 mg PO BID to assist with HR control  along with Warfarin 5 mg (half to whole tablet per day) as directed by Coumadin clinic Denies concerns for bleeding or easy bruising today Continue regular follow up with Warfarin clinic for dosing management Follow up regularly with Cardiology Recommend follow up in PCP office in 6 months or sooner if concerns arise         Other   ETOH abuse    Chronic, ongoing Reports he is drinking 16 beers each day of the weekend  Discussed trying to cut back on this but patient declined citing stress relief of drinking Will  continue to monitor Will check B12 and thiamine levels today with labs Follow up in 6 months or sooner if concerns arise       Relevant Orders   Comp Met (CMET)   Vitamin B1   B12   Screening for lipid disorders   Relevant Orders   Lipid Profile   Other Visit Diagnoses     Screening for thyroid disorder       Relevant Orders   TSH        Return in about 3 months (around 04/23/2022) for HTN, a fib.   I, Khloei Spiker E Dennisha Mouser, PA-C, have reviewed all documentation for this visit. The documentation on 01/21/22 for the exam, diagnosis, procedures, and orders are all accurate and complete.   Talitha Givens, MHS, PA-C [facility]

## 2022-01-25 LAB — CBC WITH DIFFERENTIAL/PLATELET
Basophils Absolute: 0 10*3/uL (ref 0.0–0.2)
Basos: 1 %
EOS (ABSOLUTE): 0.3 10*3/uL (ref 0.0–0.4)
Eos: 3 %
Hematocrit: 49.8 % (ref 37.5–51.0)
Hemoglobin: 16.4 g/dL (ref 13.0–17.7)
Immature Grans (Abs): 0 10*3/uL (ref 0.0–0.1)
Immature Granulocytes: 1 %
Lymphocytes Absolute: 1.2 10*3/uL (ref 0.7–3.1)
Lymphs: 16 %
MCH: 30.8 pg (ref 26.6–33.0)
MCHC: 32.9 g/dL (ref 31.5–35.7)
MCV: 93 fL (ref 79–97)
Monocytes Absolute: 0.7 10*3/uL (ref 0.1–0.9)
Monocytes: 9 %
Neutrophils Absolute: 5.4 10*3/uL (ref 1.4–7.0)
Neutrophils: 70 %
Platelets: 207 10*3/uL (ref 150–450)
RBC: 5.33 x10E6/uL (ref 4.14–5.80)
RDW: 14.1 % (ref 11.6–15.4)
WBC: 7.6 10*3/uL (ref 3.4–10.8)

## 2022-01-25 LAB — LIPID PANEL
Chol/HDL Ratio: 3.9 ratio (ref 0.0–5.0)
Cholesterol, Total: 197 mg/dL (ref 100–199)
HDL: 50 mg/dL (ref >39)
LDL Chol Calc (NIH): 109 mg/dL — ABNORMAL HIGH (ref 0–99)
Triglycerides: 218 mg/dL — ABNORMAL HIGH (ref 0–149)
VLDL Cholesterol Cal: 38 mg/dL (ref 5–40)

## 2022-01-25 LAB — COMPREHENSIVE METABOLIC PANEL
ALT: 25 IU/L (ref 0–44)
AST: 21 IU/L (ref 0–40)
Albumin/Globulin Ratio: 1.7 (ref 1.2–2.2)
Albumin: 4 g/dL (ref 3.9–4.9)
Alkaline Phosphatase: 55 IU/L (ref 44–121)
BUN/Creatinine Ratio: 22 (ref 10–24)
BUN: 22 mg/dL (ref 8–27)
Bilirubin Total: 0.5 mg/dL (ref 0.0–1.2)
CO2: 23 mmol/L (ref 20–29)
Calcium: 9.2 mg/dL (ref 8.6–10.2)
Chloride: 100 mmol/L (ref 96–106)
Creatinine, Ser: 1.02 mg/dL (ref 0.76–1.27)
Globulin, Total: 2.3 g/dL (ref 1.5–4.5)
Glucose: 81 mg/dL (ref 70–99)
Potassium: 5 mmol/L (ref 3.5–5.2)
Sodium: 136 mmol/L (ref 134–144)
Total Protein: 6.3 g/dL (ref 6.0–8.5)
eGFR: 81 mL/min/{1.73_m2} (ref >59)

## 2022-01-25 LAB — VITAMIN B12: Vitamin B-12: 494 pg/mL (ref 232–1245)

## 2022-01-25 LAB — VITAMIN B1: Thiamine: 140.4 nmol/L (ref 66.5–200.0)

## 2022-01-25 LAB — TSH: TSH: 4.27 u[IU]/mL (ref 0.450–4.500)

## 2022-02-10 ENCOUNTER — Ambulatory Visit: Payer: Medicare Other | Attending: Cardiovascular Disease

## 2022-02-10 DIAGNOSIS — Z7901 Long term (current) use of anticoagulants: Secondary | ICD-10-CM | POA: Diagnosis not present

## 2022-02-10 DIAGNOSIS — I482 Chronic atrial fibrillation, unspecified: Secondary | ICD-10-CM

## 2022-02-10 LAB — POCT INR: INR: 2.7 (ref 2.0–3.0)

## 2022-02-10 NOTE — Patient Instructions (Signed)
resume same dosage of Warfarin 1 tablet every day EXCEPT 1/2 TABLET ON WEDNESDAYS. Recheck INR 8 weeks

## 2022-02-11 ENCOUNTER — Encounter: Payer: Self-pay | Admitting: Cardiovascular Disease

## 2022-02-11 ENCOUNTER — Ambulatory Visit: Payer: Medicare Other | Attending: Cardiovascular Disease | Admitting: Cardiovascular Disease

## 2022-02-11 VITALS — BP 112/72 | HR 70 | Ht 71.0 in | Wt 268.1 lb

## 2022-02-11 DIAGNOSIS — I10 Essential (primary) hypertension: Secondary | ICD-10-CM | POA: Diagnosis present

## 2022-02-11 DIAGNOSIS — I4821 Permanent atrial fibrillation: Secondary | ICD-10-CM | POA: Diagnosis not present

## 2022-02-11 DIAGNOSIS — Z7901 Long term (current) use of anticoagulants: Secondary | ICD-10-CM | POA: Diagnosis not present

## 2022-02-11 NOTE — Progress Notes (Signed)
Cardiology Office Note   Date:  02/11/2022   ID:  Travis Diaz, DOB 1956/02/19, MRN [id number]  PCP:  Travis Billings, NP  Cardiologist:   [name], MD   Chief Complaint  Patient presents with   Other    6 month f/u no complaints today. Meds reviewed verbally with pt.       History of Present Illness: Travis Diaz is a 66 y.o. male who is here today for follow-up visit regarding permanent atrial fibrillation.  He has known history of essential hypertension, hyperlipidemia, obesity, tobacco use and alcohol use.  He quit smoking in 2006.  He drinks beer and liquor.  Sometimes he drinks 15 beers when he is sports but he reports that he does not do that very often and denies excessive drinking.   He was noted incidentally to be in atrial fibrillation during an office visit in October of 2022.  Carvedilol was added for blood pressure and rate control.  He was started on anticoagulation with Xarelto.  An echocardiogram showed normal LV systolic function with moderate biatrial enlargement and moderate mitral regurgitation. He was asymptomatic from atrial fibrillation standpoint and thus was treated with rate control.  He could not afford Xarelto and thus was switched to warfarin. He was hospitalized in February of this year with upper GI bleed.  INR was 2.5.  EGD showed 2 nonbleeding gastric ulcers, gastritis and duodenal erosions.  He stopped working hard liquor at that time which was thought to be contributing to gastritis.  He cut down on alcohol intake overall. He has some lower extremity swelling with chronic stasis dermatitis and superficial ulceration.  He has been doing well with no recent chest pain, shortness of breath or palpitations.  Past Medical History:  Diagnosis Date   A-fib [facility])    Acute GI bleeding 05/14/2021   Gout    Hypertension     Past Surgical History:  Procedure Laterality Date   broken arm     COLONOSCOPY WITH PROPOFOL N/A 05/14/2016    Procedure: COLONOSCOPY WITH PROPOFOL;  Surgeon: Manya Silvas, MD;  Location: [facility] ENDOSCOPY;  Service: Endoscopy;  Laterality: N/A;   ESOPHAGOGASTRODUODENOSCOPY (EGD) WITH PROPOFOL N/A 05/14/2021   Procedure: ESOPHAGOGASTRODUODENOSCOPY (EGD) WITH PROPOFOL;  Surgeon: [name], DO;  Location: [place];  Service: Gastroenterology;  Laterality: N/A;   upper leg procedure Right    removal of blood clots     Current Outpatient Medications  Medication Sig Dispense Refill   allopurinol (ZYLOPRIM) 300 MG tablet Take 1 tablet (300 mg total) by mouth daily. Needs appt for further refills 90 tablet 0   benazepril (LOTENSIN) 40 MG tablet Take 1 tablet (40 mg total) by mouth daily. 90 tablet 0   carvedilol (COREG) 6.25 MG tablet Take 1 tablet (6.25 mg total) by mouth 2 (two) times daily. 180 tablet 3   Flaxseed, Linseed, (FLAXSEED OIL) 1200 MG CAPS Take 1,000 mg by mouth daily.      meloxicam (MOBIC) 7.5 MG tablet Take 7.5 mg by mouth as needed.     pantoprazole (PROTONIX) 40 MG tablet Take 1 tablet (40 mg total) by mouth 2 (two) times daily. 180 tablet 0   warfarin (COUMADIN) 5 MG tablet Take 1/2 a tablet to 1 tablet by mouth daily as directed by the coumadin clinic. 35 tablet 2   magnesium 30 MG tablet Take 30 mg by mouth 2 (two) times daily. (Patient not taking: Reported on 02/11/2022)  No current facility-administered medications for this visit.    Allergies:   Elemental sulfur    Social History:  The patient  reports that he quit smoking about 17 years ago. His smoking use included cigarettes. He has never used smokeless tobacco. He reports current alcohol use of about 24.0 standard drinks of alcohol per week. He reports that he does not use drugs.   Family History:  The patient's family history includes Cancer in his father and mother; Dementia in his maternal grandmother; Diabetes in his sister; Early death in his father and paternal grandfather; Heart attack in his  father, maternal grandfather, paternal grandfather, and paternal grandmother; Heart disease in his father.    ROS:  Please see the history of present illness.   Otherwise, review of systems are positive for none.   All other systems are reviewed and negative.    PHYSICAL EXAM: VS:  BP 112/72 (BP Location: Left Arm, Patient Position: Sitting, Cuff Size: Large)   Pulse 70   Ht '5\' 11"'$  (1.803 m)   Wt 268 lb 2 oz (121.6 kg)   SpO2 97%   BMI 37.40 kg/m  , BMI Body mass index is 37.4 kg/m. GEN: Well nourished, well developed, in no acute distress  HEENT: normal  Neck: no JVD, carotid bruits, or masses Cardiac: Irregularly irregular; no murmurs, rubs, or gallops.  Mild bilateral leg edema with chronic stasis dermatitis.  Superficial ulceration. Respiratory:  clear to auscultation bilaterally, normal work of breathing GI: soft, nontender, nondistended, + BS MS: no deformity or atrophy  Skin: warm and dry, no rash Neuro:  Strength and sensation are intact Psych: euthymic mood, full affect Vascular: Distal pulses are palpable.   EKG:  EKG is ordered today. EKG showed atrial fibrillation with ventricular rate of 70 bpm.    Recent Labs: 05/15/2021: Magnesium 2.1 01/21/2022: ALT 25; BUN 22; Creatinine, Ser 1.02; Hemoglobin 16.4; Platelets 207; Potassium 5.0; Sodium 136; TSH 4.270    Lipid Panel    Component Value Date/Time   CHOL 197 01/21/2022 0949   TRIG 218 (H) 01/21/2022 0949   HDL 50 01/21/2022 0949   CHOLHDL 3.9 01/21/2022 0949   LDLCALC 109 (H) 01/21/2022 0949      Wt Readings from Last 3 Encounters:  02/11/22 268 lb 2 oz (121.6 kg)  01/21/22 268 lb 3.2 oz (121.7 kg)  09/02/21 266 lb 9.6 oz (120.9 kg)          01/09/2021    7:57 AM  PAD Screen  Previous PAD dx? No  Previous surgical procedure? No  Pain with walking? No  Feet/toe relief with dangling? No  Painful, non-healing ulcers? No  Extremities discolored? No      ASSESSMENT AND PLAN:  1.   Permanent atrial fibrillation: Ventricular rate is well controlled on carvedilol and he is asymptomatic.  He is on anticoagulation with warfarin given that he could not afford DOACs.  INR yesterday was therapeutic.  2.  Essential hypertension: Blood pressure is  well controlled on carvedilol and benazepril.  3.  Possible sleep apnea: The patient declined sleep study.  4.  GI bleed: No recurrent GI bleed.  5.  Chronic stasis dermatitis of lower extremities with edema: Distal pulses are normal.  No evidence of peripheral arterial disease.  Continue leg elevation.    Disposition:   FU with me in 12 month  Signed,  [name], MD  02/11/2022 10:56 AM    [place]

## 2022-02-11 NOTE — Patient Instructions (Signed)
Medication Instructions:  No changes *If you need a refill on your cardiac medications before your next appointment, please call your pharmacy*   Lab Work: None ordered If you have labs (blood work) drawn today and your tests are completely normal, you will receive your results only by: [place] (if you have MyChart) OR A paper copy in the mail If you have any lab test that is abnormal or we need to change your treatment, we will call you to review the results.   Testing/Procedures: None ordered   Follow-Up: At [facility], you and your health needs are our priority.  As part of our continuing mission to provide you with exceptional heart care, we have created designated Provider Care Teams.  These Care Teams include your primary Cardiologist (physician) and Advanced Practice Providers (APPs -  Physician Assistants and Nurse Practitioners) who all work together to provide you with the care you need, when you need it.  We recommend signing up for the patient portal called "MyChart".  Sign up information is provided on this After Visit Summary.  MyChart is used to connect with patients for Virtual Visits (Telemedicine).  Patients are able to view lab/test results, encounter notes, upcoming appointments, etc.  Non-urgent messages can be sent to your provider as well.   To learn more about what you can do with MyChart, go to [email or web address].    Your next appointment:   12 month(s)  The format for your next appointment:   In Person  Provider:   You may see Dr. Fletcher Anon or one of the following Advanced Practice Providers on your designated Care Team:   Murray Hodgkins, NP Christell Faith, PA-C Cadence Kathlen Mody, PA-C Gerrie Nordmann, NP     Important Information About Sugar

## 2022-03-20 ENCOUNTER — Other Ambulatory Visit: Payer: Self-pay | Admitting: Cardiovascular Disease

## 2022-04-07 ENCOUNTER — Ambulatory Visit: Payer: Medicare Other

## 2022-04-07 ENCOUNTER — Other Ambulatory Visit: Payer: Self-pay | Admitting: Nurse Practitioner

## 2022-04-07 DIAGNOSIS — M1000 Idiopathic gout, unspecified site: Secondary | ICD-10-CM

## 2022-04-14 ENCOUNTER — Ambulatory Visit: Payer: Medicare Other | Attending: Cardiovascular Disease

## 2022-04-14 DIAGNOSIS — I482 Chronic atrial fibrillation, unspecified: Secondary | ICD-10-CM | POA: Diagnosis not present

## 2022-04-14 DIAGNOSIS — Z7901 Long term (current) use of anticoagulants: Secondary | ICD-10-CM | POA: Diagnosis not present

## 2022-04-14 DIAGNOSIS — Z5181 Encounter for therapeutic drug level monitoring: Secondary | ICD-10-CM | POA: Diagnosis not present

## 2022-04-14 LAB — POCT INR: INR: 2.5 (ref 2.0–3.0)

## 2022-04-14 NOTE — Patient Instructions (Signed)
resume same dosage of Warfarin 1 tablet every day EXCEPT 1/2 TABLET ON WEDNESDAYS. Colonoscopy 1/26;  HOLDING 1/21-1/25 Recheck INR 3 weeks

## 2022-04-22 ENCOUNTER — Encounter: Payer: Self-pay | Admitting: Gastroenterology

## 2022-04-22 NOTE — H&P (Signed)
Pre-Procedure H&P   Patient ID: Travis Diaz is a 67 y.o. male.  Gastroenterology Provider: [name], DO  Referring Provider: Laurine Blazer, PA PCP: Jon Billings, NP  Date: 04/23/2022  HPI Travis Diaz is a 67 y.o. male who presents today for Colonoscopy for Surveillance-personal history of colon polyps .  Patient is overdue for repeat surveillance colonoscopy as he had 11 adenomatous polyps in February 2018 including 1 with high-grade dysplasia.  He was also noted to have left-sided diverticulosis.  He was recommended 44-monthfollow-up at that time.  He was seen in the hospital for GI bleeding (February 2023) with low iron stores ferritin 9 saturation 19% TIBC 481.  Hemoglobin is since improved to 16.5 MCV 92 platelets 225,000 creatinine 1.3.  A question of cirrhosis was raised during his hospitalization based on imaging.  HBV core antibody total positive, but IgM negative.  Underwent colonoscopy in June 2003 as well noting adenomatous polyp.  Currently he reports daily bowel movement without melena hematochezia diarrhea constipation or abdominal pain.  Daily alcohol use as well as Coumadin.  Has since quit tobacco.  No NSAIDs since his GI bleed in February 2023   Past Medical History:  Diagnosis Date   A-fib ([facility]    Acute GI bleeding 05/14/2021   Gout    Hypertension     Past Surgical History:  Procedure Laterality Date   broken arm     COLONOSCOPY WITH PROPOFOL N/A 05/14/2016   Procedure: COLONOSCOPY WITH PROPOFOL;  Surgeon: RManya Silvas MD;  [place]  Service: [place];  Laterality: N/A;   ESOPHAGOGASTRODUODENOSCOPY (EGD) WITH PROPOFOL N/A 05/14/2021   Procedure: ESOPHAGOGASTRODUODENOSCOPY (EGD) WITH PROPOFOL;  Surgeon: RAnnamaria Helling DO;  [place]  Service: Gastroenterology;  Laterality: N/A;   upper leg procedure Right    removal of blood clots    Family History No h/o GI disease  or malignancy  Review of Systems  Constitutional:  Negative for activity change, appetite change, chills, diaphoresis, fatigue, fever and unexpected weight change.  HENT:  Negative for trouble swallowing and voice change.   Respiratory:  Negative for shortness of breath and wheezing.   Cardiovascular:  Negative for chest pain, palpitations and leg swelling.  Gastrointestinal:  Negative for abdominal distention, abdominal pain, anal bleeding, blood in stool, constipation, diarrhea, nausea and vomiting.  Musculoskeletal:  Negative for arthralgias and myalgias.  Skin:  Negative for color change and pallor.  Neurological:  Negative for dizziness, syncope and weakness.  Psychiatric/Behavioral:  Negative for confusion. The patient is not nervous/anxious.   All other systems reviewed and are negative.    Medications No current facility-administered medications on file prior to encounter.   Current Outpatient Medications on File Prior to Encounter  Medication Sig Dispense Refill   Flaxseed, Linseed, (FLAXSEED OIL) 1200 MG CAPS Take 1,000 mg by mouth daily.      warfarin (COUMADIN) 5 MG tablet Take 1/2 a tablet to 1 tablet by mouth daily as directed by the coumadin clinic. 35 tablet 2   magnesium 30 MG tablet Take 30 mg by mouth 2 (two) times daily. (Patient not taking: Reported on 02/11/2022)     pantoprazole (PROTONIX) 40 MG tablet Take 1 tablet (40 mg total) by mouth 2 (two) times daily. 180 tablet 0    Pertinent medications related to GI and procedure were reviewed by me with the patient prior to the procedure   Current Facility-Administered Medications:    0.9 %  sodium chloride infusion, , Intravenous, Continuous, [name], DO, Last Rate: 20 mL/hr at 04/23/22 5397, 20 mL/hr at 04/23/22 6734      Allergies  Allergen Reactions   Elemental Sulfur Other (See Comments)    Was told by his mother   Allergies were reviewed by me prior to the procedure  Objective   Body  mass index is 36.54 kg/m. Vitals:   04/23/22 0925  BP: (!) 152/90  Pulse: 88  Resp: 18  Temp: (!) 96.9 F (36.1 C)  TempSrc: Temporal  SpO2: 99%  Weight: 118.8 kg  Height: '5\' 11"'$  (1.803 m)     Physical Exam Vitals and nursing note reviewed.  Constitutional:      General: He is not in acute distress.    Appearance: Normal appearance. He is obese. He is not ill-appearing, toxic-appearing or diaphoretic.  HENT:     Head: Normocephalic and atraumatic.     Nose: Nose normal.     Mouth/Throat:     Mouth: Mucous membranes are moist.     Pharynx: Oropharynx is clear.  Eyes:     General: No scleral icterus.    Extraocular Movements: Extraocular movements intact.  Cardiovascular:     Rate and Rhythm: Normal rate and regular rhythm.     Heart sounds: Normal heart sounds. No murmur heard.    No friction rub. No gallop.  Pulmonary:     Effort: Pulmonary effort is normal. No respiratory distress.     Breath sounds: Normal breath sounds. No wheezing, rhonchi or rales.  Abdominal:     General: Bowel sounds are normal. There is no distension.     Palpations: Abdomen is soft.     Tenderness: There is no abdominal tenderness. There is no guarding or rebound.  Musculoskeletal:     Cervical back: Neck supple.     Right lower leg: No edema.     Left lower leg: No edema.  Skin:    General: Skin is warm and dry.     Coloration: Skin is not jaundiced or pale.  Neurological:     General: No focal deficit present.     Mental Status: He is alert and oriented to person, place, and time. Mental status is at baseline.  Psychiatric:        Mood and Affect: Mood normal.        Behavior: Behavior normal.        Thought Content: Thought content normal.        Judgment: Judgment normal.      Assessment:  Travis Diaz is a 67 y.o. male  who presents today for Colonoscopy for Surveillance-personal history of colon polyps .  Plan:  Colonoscopy with possible intervention  today  Colonoscopy with possible biopsy, control of bleeding, polypectomy, and interventions as necessary has been discussed with the patient/patient representative. Informed consent was obtained from the patient/patient representative after explaining the indication, nature, and risks of the procedure including but not limited to death, bleeding, perforation, missed neoplasm/lesions, cardiorespiratory compromise, and reaction to medications. Opportunity for questions was given and appropriate answers were provided. Patient/patient representative has verbalized understanding is amenable to undergoing the procedure.   [name], DO  [facility] Gastroenterology  Portions of the record may have been created with voice recognition software. Occasional wrong-word or 'sound-a-like' substitutions may have occurred due to the inherent limitations of voice recognition software.  Read the chart carefully and recognize, using context, where substitutions may have occurred.

## 2022-04-23 ENCOUNTER — Ambulatory Visit: Payer: Medicare Other | Admitting: Anesthesiology

## 2022-04-23 ENCOUNTER — Ambulatory Visit
Admission: RE | Admit: 2022-04-23 | Discharge: 2022-04-23 | Disposition: A | Discharge status: Home / Self Care | Payer: Medicare Other | Attending: Gastroenterology | Admitting: Gastroenterology

## 2022-04-23 ENCOUNTER — Encounter
Admission: RE | Disposition: A | Discharge status: Home / Self Care | Payer: Self-pay | Source: Home / Self Care | Attending: Gastroenterology

## 2022-04-23 ENCOUNTER — Encounter: Payer: Self-pay | Admitting: Gastroenterology

## 2022-04-23 DIAGNOSIS — D128 Benign neoplasm of rectum: Secondary | ICD-10-CM | POA: Diagnosis not present

## 2022-04-23 DIAGNOSIS — D123 Benign neoplasm of transverse colon: Secondary | ICD-10-CM | POA: Diagnosis not present

## 2022-04-23 DIAGNOSIS — K5730 Diverticulosis of large intestine without perforation or abscess without bleeding: Secondary | ICD-10-CM | POA: Insufficient documentation

## 2022-04-23 DIAGNOSIS — Z87891 Personal history of nicotine dependence: Secondary | ICD-10-CM | POA: Insufficient documentation

## 2022-04-23 DIAGNOSIS — D122 Benign neoplasm of ascending colon: Secondary | ICD-10-CM | POA: Insufficient documentation

## 2022-04-23 DIAGNOSIS — K640 First degree hemorrhoids: Secondary | ICD-10-CM | POA: Insufficient documentation

## 2022-04-23 DIAGNOSIS — Z86010 Personal history of colonic polyps: Secondary | ICD-10-CM | POA: Insufficient documentation

## 2022-04-23 DIAGNOSIS — Z09 Encounter for follow-up examination after completed treatment for conditions other than malignant neoplasm: Secondary | ICD-10-CM | POA: Insufficient documentation

## 2022-04-23 DIAGNOSIS — I4891 Unspecified atrial fibrillation: Secondary | ICD-10-CM | POA: Diagnosis not present

## 2022-04-23 DIAGNOSIS — I10 Essential (primary) hypertension: Secondary | ICD-10-CM | POA: Insufficient documentation

## 2022-04-23 DIAGNOSIS — D124 Benign neoplasm of descending colon: Secondary | ICD-10-CM | POA: Insufficient documentation

## 2022-04-23 DIAGNOSIS — Z1211 Encounter for screening for malignant neoplasm of colon: Secondary | ICD-10-CM | POA: Insufficient documentation

## 2022-04-23 HISTORY — PX: COLONOSCOPY WITH PROPOFOL: SHX5780

## 2022-04-23 SURGERY — COLONOSCOPY WITH PROPOFOL
Anesthesia: General

## 2022-04-23 MED ORDER — LIDOCAINE HCL (CARDIAC) PF 100 MG/5ML IV SOSY
PREFILLED_SYRINGE | INTRAVENOUS | Status: DC | PRN
  Administered 2022-04-23: 40 mg via INTRAVENOUS

## 2022-04-23 MED ORDER — DEXMEDETOMIDINE HCL IN NACL 200 MCG/50ML IV SOLN
INTRAVENOUS | Status: DC | PRN
  Administered 2022-04-23: 8 ug via INTRAVENOUS

## 2022-04-23 MED ORDER — PROPOFOL 10 MG/ML IV BOLUS
INTRAVENOUS | Status: DC | PRN
  Administered 2022-04-23: 90 mg via INTRAVENOUS

## 2022-04-23 MED ORDER — PROPOFOL 500 MG/50ML IV EMUL
INTRAVENOUS | Status: DC | PRN
  Administered 2022-04-23: 150 ug/kg/min via INTRAVENOUS

## 2022-04-23 MED ORDER — PROPOFOL 1000 MG/100ML IV EMUL
INTRAVENOUS | Status: AC
  Filled 2022-04-23: qty 100

## 2022-04-23 MED ORDER — SODIUM CHLORIDE 0.9 % IV SOLN
INTRAVENOUS | Status: DC
  Administered 2022-04-23: 20 mL/h via INTRAVENOUS

## 2022-04-23 MED ORDER — PHENYLEPHRINE HCL (PRESSORS) 10 MG/ML IV SOLN
INTRAVENOUS | Status: DC | PRN
  Administered 2022-04-23 (×6): 160 ug via INTRAVENOUS

## 2022-04-23 NOTE — Anesthesia Postprocedure Evaluation (Signed)
Anesthesia Post Note  Patient: Travis Diaz  Procedure(s) Performed: COLONOSCOPY WITH PROPOFOL  Patient location during evaluation: Endoscopy Anesthesia Type: General Level of consciousness: awake and alert Pain management: pain level controlled Vital Signs Assessment: post-procedure vital signs reviewed and stable Respiratory status: spontaneous breathing, nonlabored ventilation, respiratory function stable and patient connected to nasal cannula oxygen Cardiovascular status: blood pressure returned to baseline and stable Postop Assessment: no apparent nausea or vomiting Anesthetic complications: no   No notable events documented.   Last Vitals:  Vitals:   04/23/22 1152 04/23/22 1156  BP:  (!) 102/50  Pulse: 72 (!) 39  Resp: 14 20  Temp: (!) 35.7 C   SpO2: 99% 100%    Last Pain:  Vitals:   04/23/22 1152  TempSrc:   PainSc: 0-No pain                 Ilene Qua

## 2022-04-23 NOTE — Anesthesia Procedure Notes (Signed)
Date/Time: 04/23/2022 11:11 AM  Performed by: Doreen Salvage, CRNAPre-anesthesia Checklist: Patient identified, Emergency Drugs available, Suction available and Patient being monitored Patient Re-evaluated:Patient Re-evaluated prior to induction Oxygen Delivery Method: Nasal cannula Induction Type: IV induction Dental Injury: Teeth and Oropharynx as per pre-operative assessment  Comments: Nasal cannula with etCO2 monitoring

## 2022-04-23 NOTE — Anesthesia Preprocedure Evaluation (Signed)
Anesthesia Evaluation  Patient identified by MRN, date of birth, ID band Patient awake    Reviewed: Allergy & Precautions, NPO status , Patient's Chart, lab work & pertinent test results  History of Anesthesia Complications Negative for: history of anesthetic complications  Airway Mallampati: III  TM Distance: >3 FB Neck ROM: full    Dental no notable dental hx.    Pulmonary neg pulmonary ROS, former smoker   Pulmonary exam normal        Cardiovascular hypertension, On Medications + dysrhythmias Atrial Fibrillation      Neuro/Psych negative neurological ROS  negative psych ROS   GI/Hepatic negative GI ROS,,,(+)     substance abuse  alcohol use  Endo/Other  negative endocrine ROS    Renal/GU negative Renal ROS  negative genitourinary   Musculoskeletal   Abdominal   Peds  Hematology negative hematology ROS (+)   Anesthesia Other Findings Past Medical History: No date: A-fib ([place]) 05/14/2021: Acute GI bleeding No date: Gout No date: Hypertension  Past Surgical History: No date: broken arm 05/14/2016: COLONOSCOPY WITH PROPOFOL; N/A     Comment:  Procedure: COLONOSCOPY WITH PROPOFOL;  Surgeon: Manya Silvas, MD;  Location: [facility] ENDOSCOPY;  Service:               Endoscopy;  Laterality: N/A; 05/14/2021: ESOPHAGOGASTRODUODENOSCOPY (EGD) WITH PROPOFOL; N/A     Comment:  Procedure: ESOPHAGOGASTRODUODENOSCOPY (EGD) WITH               PROPOFOL;  Surgeon: [name], DO;  Location:              [facility] ENDOSCOPY;  Service: Gastroenterology;  Laterality:               N/A; No date: upper leg procedure; Right     Comment:  removal of blood clots  BMI    Body Mass Index: 36.54 kg/m      Reproductive/Obstetrics negative OB ROS                             Anesthesia Physical Anesthesia Plan  ASA: 3  Anesthesia Plan: General   Post-op Pain Management: Minimal  or no pain anticipated   Induction: Intravenous  PONV Risk Score and Plan: Propofol infusion and TIVA  Airway Management Planned: Natural Airway and Nasal Cannula  Additional Equipment:   Intra-op Plan:   Post-operative Plan:   Informed Consent: I have reviewed the patients History and Physical, chart, labs and discussed the procedure including the risks, benefits and alternatives for the proposed anesthesia with the patient or authorized representative who has indicated his/her understanding and acceptance.     Dental Advisory Given  Plan Discussed with: Anesthesiologist, CRNA and Surgeon  Anesthesia Plan Comments: (Patient consented for risks of anesthesia including but not limited to:  - adverse reactions to medications - risk of airway placement if required - damage to eyes, teeth, lips or other oral mucosa - nerve damage due to positioning  - sore throat or hoarseness - Damage to heart, brain, nerves, lungs, other parts of body or loss of life  Patient voiced understanding.)        Anesthesia Quick Evaluation

## 2022-04-23 NOTE — Interval H&P Note (Signed)
History and Physical Interval Note: Preprocedure H&P from 04/23/22  was reviewed and there was no interval change after seeing and examining the patient.  Written consent was obtained from the patient after discussion of risks, benefits, and alternatives. Patient has consented to proceed with Colonoscopy with possible intervention   04/23/2022 11:00 AM  Travis Diaz  has presented today for surgery, with the diagnosis of H/O colon Polyps.  The various methods of treatment have been discussed with the patient and family. After consideration of risks, benefits and other options for treatment, the patient has consented to  Procedure(s): COLONOSCOPY WITH PROPOFOL (N/A) as a surgical intervention.  The patient's history has been reviewed, patient examined, no change in status, stable for surgery.  I have reviewed the patient's chart and labs.  Questions were answered to the patient's satisfaction.     [name]

## 2022-04-23 NOTE — Transfer of Care (Signed)
Immediate Anesthesia Transfer of Care Note  Patient: Travis Diaz  Procedure(s) Performed: COLONOSCOPY WITH PROPOFOL  Patient Location: Endoscopy Unit  Anesthesia Type:General  Level of Consciousness: awake and alert   Airway & Oxygen Therapy: Patient Spontanous Breathing  Post-op Assessment: Report given to RN and Post -op Vital signs reviewed and stable  Post vital signs: Reviewed  Last Vitals:  Vitals Value Taken Time  BP 130/107 04/23/22 1153  Temp 35.7 C 04/23/22 1152  Pulse 70 04/23/22 1155  Resp 16 04/23/22 1155  SpO2 99 % 04/23/22 1155  Vitals shown include unvalidated device data.  Last Pain:  Vitals:   04/23/22 1152  TempSrc:   PainSc: 0-No pain         Complications: No notable events documented.

## 2022-04-23 NOTE — Op Note (Signed)
[facility] Gastroenterology Patient Name: Travis Diaz Procedure Date: 04/23/2022 10:58 AM MRN: [id number] Account #: [id number] Date of Birth: 1956-03-11 Admit Type: Outpatient Age: 67 Room: [facility] ENDO ROOM 1 Gender: Male Note Status: [place] Instrument Name: Colonoscope [id number] Procedure:             Colonoscopy Indications:           High risk colon cancer surveillance: Personal history                         of colonic polyps Providers:             Rueben Bash, DO Referring MD:          Jon Billings (Referring MD) Medicines:             Monitored Anesthesia Care Complications:         No immediate complications. Estimated blood loss:                         Minimal. Procedure:             Pre-Anesthesia Assessment:                        - Prior to the procedure, a History and Physical was                         performed, and patient medications and allergies were                         reviewed. The patient is competent. The risks and                         benefits of the procedure and the sedation options and                         risks were discussed with the patient. All questions                         were answered and informed consent was obtained.                         Patient identification and proposed procedure were                         verified by the physician, the nurse, the anesthetist                         and the technician in the endoscopy suite. Mental                         Status Examination: alert and oriented. Airway                         Examination: normal oropharyngeal airway and neck                         mobility. Respiratory Examination: clear to  auscultation. CV Examination: RRR, no murmurs, no S3                         or S4. Prophylactic Antibiotics: The patient does not                         require prophylactic antibiotics. Prior                          Anticoagulants: The patient has taken Coumadin                         (warfarin), last dose was 5 days prior to procedure.                         ASA Grade Assessment: III - A patient with severe                         systemic disease. After reviewing the risks and                         benefits, the patient was deemed in satisfactory                         condition to undergo the procedure. The anesthesia                         plan was to use monitored anesthesia care (MAC).                         Immediately prior to administration of medications,                         the patient was re-assessed for adequacy to receive                         sedatives. The heart rate, respiratory rate, oxygen                         saturations, blood pressure, adequacy of pulmonary                         ventilation, and response to care were monitored                         throughout the procedure. The physical status of the                         patient was re-assessed after the procedure.                        After obtaining informed consent, the colonoscope was                         passed under direct vision. Throughout the procedure,                         the patient's blood pressure, pulse, and oxygen  saturations were monitored continuously. The                         Colonoscope was introduced through the anus and                         advanced to the the cecum, identified by appendiceal                         orifice and ileocecal valve. The colonoscopy was                         performed without difficulty. The patient tolerated                         the procedure well. The quality of the bowel                         preparation was evaluated using the BBPS [facility] Bowel                         Preparation Scale) with scores of: Right Colon = 2                         (minor amount of residual staining, small fragments of                          stool and/or opaque liquid, but mucosa seen well),                         Transverse Colon = 2 (minor amount of residual                         staining, small fragments of stool and/or opaque                         liquid, but mucosa seen well) and Left Colon = 2                         (minor amount of residual staining, small fragments of                         stool and/or opaque liquid, but mucosa seen well). The                         total BBPS score equals 6. Fair Prep. The ileocecal                         valve, appendiceal orifice, and rectum were                         photographed. Findings:      The perianal and digital rectal examinations were normal. Pertinent       negatives include normal sphincter tone.      Multiple small-mouthed diverticula were found in the entire colon.       Estimated blood loss: none.      Non-bleeding internal hemorrhoids were  found during retroflexion. The       hemorrhoids were Grade I (internal hemorrhoids that do not prolapse).       Estimated blood loss: none.      Five sessile polyps were found in the descending colon (3) and ascending       colon (1) and rectum (1). The polyps were 3 to 6 mm in size. These       polyps were removed with a cold snare. Resection and retrieval were       complete. Estimated blood loss was minimal.      Six sessile polyps were found in the transverse colon (4) and ascending       colon (2). The polyps were 1 to 2 mm in size. These polyps were removed       with a jumbo cold forceps. Resection and retrieval were complete.       Estimated blood loss was minimal.      The exam was otherwise without abnormality on direct and retroflexion       views. Impression:            - Diverticulosis in the entire examined colon.                        - Non-bleeding internal hemorrhoids.                        - Five 3 to 6 mm polyps in the rectum, in the                         descending colon and in the  ascending colon and in the                         rectum, removed with a cold snare. Resected and                         retrieved.                        - Six 1 to 2 mm polyps in the transverse colon and in                         the ascending colon, removed with a jumbo cold                         forceps. Resected and retrieved.                        - The examination was otherwise normal on direct and                         retroflexion views. Recommendation:        - Patient has a contact number available for                         emergencies. The signs and symptoms of potential                         delayed complications were discussed with the patient.  Return to normal activities tomorrow. Written                         discharge instructions were provided to the patient.                        - Discharge patient to home.                        - Resume previous diet.                        - Continue present medications.                        - No aspirin, ibuprofen, naproxen, or other                         non-steroidal anti-inflammatory drugs for 5 days after                         polyp removal.                        - Resume Coumadin (warfarin) at prior dose in 2 days.                         Refer to managing physician for further adjustment of                         therapy.                        - Await pathology results.                        - Repeat colonoscopy in 6 months for surveillance of                         multiple polyps.                        - Return to GI office as previously scheduled.                        - The findings and recommendations were discussed with                         the patient. Procedure Code(s):     --- Professional ---                        [phone], Colonoscopy, flexible; with removal of                         tumor(s), polyp(s), or other lesion(s) by snare                          technique                        33435, 59, Colonoscopy, flexible; with biopsy, single  or multiple Diagnosis Code(s):     --- Professional ---                        Z86.010, Personal history of colonic polyps                        K64.0, First degree hemorrhoids                        D12.8, Benign neoplasm of rectum                        D12.4, Benign neoplasm of descending colon                        D12.3, Benign neoplasm of transverse colon (hepatic                         flexure or splenic flexure)                        D12.2, Benign neoplasm of ascending colon                        K57.30, Diverticulosis of large intestine without                         perforation or abscess without bleeding CPT copyright 2022 American Medical Association. All rights reserved. The codes documented in this report are preliminary and upon coder review may  be revised to meet current compliance requirements. Attending Participation:      I personally performed the entire procedure. Volney American, DO [name] DO, DO 04/23/2022 11:53:07 AM This report has been signed electronically. Number of Addenda: 0 Note Initiated On: 04/23/2022 10:58 AM Scope Withdrawal Time: 0 hours 33 minutes 17 seconds  Total Procedure Duration: 0 hours 37 minutes 20 seconds  Estimated Blood Loss:  Estimated blood loss was minimal.      [facility]

## 2022-04-26 ENCOUNTER — Encounter: Payer: Self-pay | Admitting: Nurse Practitioner

## 2022-04-26 ENCOUNTER — Ambulatory Visit (INDEPENDENT_AMBULATORY_CARE_PROVIDER_SITE_OTHER): Payer: Medicare Other | Admitting: Nurse Practitioner

## 2022-04-26 VITALS — BP 148/87 | HR 88 | Temp 98.5°F | Wt 265.2 lb

## 2022-04-26 DIAGNOSIS — F1010 Alcohol abuse, uncomplicated: Secondary | ICD-10-CM

## 2022-04-26 DIAGNOSIS — I4891 Unspecified atrial fibrillation: Secondary | ICD-10-CM

## 2022-04-26 DIAGNOSIS — N1832 Chronic kidney disease, stage 3b: Secondary | ICD-10-CM | POA: Insufficient documentation

## 2022-04-26 DIAGNOSIS — E7800 Pure hypercholesterolemia, unspecified: Secondary | ICD-10-CM | POA: Diagnosis not present

## 2022-04-26 DIAGNOSIS — I10 Essential (primary) hypertension: Secondary | ICD-10-CM

## 2022-04-26 DIAGNOSIS — K7460 Unspecified cirrhosis of liver: Secondary | ICD-10-CM | POA: Insufficient documentation

## 2022-04-26 DIAGNOSIS — Z6839 Body mass index (BMI) 39.0-39.9, adult: Secondary | ICD-10-CM

## 2022-04-26 LAB — SURGICAL PATHOLOGY

## 2022-04-26 MED ORDER — BENAZEPRIL HCL 40 MG PO TABS: 40.0000 mg | ORAL_TABLET | Freq: Every day | ORAL | 1 refills | Status: DC

## 2022-04-26 MED ORDER — ALLOPURINOL 300 MG PO TABS: ORAL_TABLET | ORAL | 1 refills | Status: DC

## 2022-04-26 NOTE — Assessment & Plan Note (Signed)
Will check labs at next visit.  Patient did not want to check labs today.

## 2022-04-26 NOTE — Progress Notes (Signed)
BP (!) 148/87 (BP Location: Left Arm, Cuff Size: Normal)   Pulse 88   Temp 98.5 F (36.9 C) (Oral)   Wt 265 lb 3.2 oz (120.3 kg)   SpO2 97%   BMI 36.99 kg/m    Subjective:    Patient ID: Travis Diaz, male    DOB: [birth year], 67 y.o.   MRN: [id number]  HPI: Travis Diaz is a 67 y.o. male  Chief Complaint  Patient presents with   Hypertension   Atrial Fibrillation   Gout   HYPERTENSION / HYPERLIPIDEMIA Satisfied with current treatment? yes Duration of hypertension: years BP monitoring frequency: not checking BP range:  BP medication side effects: no Past BP meds: benazepril and carvedilol Duration of hyperlipidemia: years Cholesterol medication side effects: no Cholesterol supplements: none Past cholesterol medications: none Medication compliance: excellent compliance Aspirin: no Recent stressors: no Recurrent headaches: no Visual changes: no Palpitations: no Dyspnea: no Chest pain: no Lower extremity edema: no Dizzy/lightheaded: no  Patient states he is drinking quite a bit.  He cannot quantify how much he was drinking.   Relevant past medical, surgical, family and social history reviewed and updated as indicated. Interim medical history since our last visit reviewed. Allergies and medications reviewed and updated.  Review of Systems  Eyes:  Negative for visual disturbance.  Respiratory:  Negative for shortness of breath.   Cardiovascular:  Negative for chest pain and leg swelling.  Neurological:  Negative for light-headedness and headaches.    Per HPI unless specifically indicated above     Objective:    BP (!) 148/87 (BP Location: Left Arm, Cuff Size: Normal)   Pulse 88   Temp 98.5 F (36.9 C) (Oral)   Wt 265 lb 3.2 oz (120.3 kg)   SpO2 97%   BMI 36.99 kg/m   Wt Readings from Last 3 Encounters:  04/26/22 265 lb 3.2 oz (120.3 kg)  04/23/22 262 lb (118.8 kg)  02/11/22 268 lb 2 oz (121.6 kg)    Physical Exam Vitals and nursing  note reviewed.  Constitutional:      General: He is not in acute distress.    Appearance: Normal appearance. He is obese. He is not ill-appearing, toxic-appearing or diaphoretic.  HENT:     Head: Normocephalic.     Right Ear: External ear normal.     Left Ear: External ear normal.     Nose: Nose normal. No congestion or rhinorrhea.     Mouth/Throat:     Mouth: Mucous membranes are moist.  Eyes:     General:        Right eye: No discharge.        Left eye: No discharge.     Extraocular Movements: Extraocular movements intact.     Conjunctiva/sclera: Conjunctivae normal.     Pupils: Pupils are equal, round, and reactive to light.  Cardiovascular:     Rate and Rhythm: Normal rate and regular rhythm.     Heart sounds: No murmur heard. Pulmonary:     Effort: Pulmonary effort is normal. No respiratory distress.     Breath sounds: Normal breath sounds. No wheezing, rhonchi or rales.  Abdominal:     General: Abdomen is flat. Bowel sounds are normal.  Musculoskeletal:     Cervical back: Normal range of motion and neck supple.  Skin:    General: Skin is warm and dry.     Capillary Refill: Capillary refill takes less than 2 seconds.  Neurological:  General: No focal deficit present.     Mental Status: He is alert and oriented to person, place, and time.  Psychiatric:        Mood and Affect: Mood normal.        Behavior: Behavior normal.        Thought Content: Thought content normal.        Judgment: Judgment normal.     Results for orders placed or performed during the hospital encounter of 04/23/22  Surgical pathology  Result Value Ref Range   SURGICAL PATHOLOGY      SURGICAL PATHOLOGY CASE: [id number] PATIENT: Travis Diaz Surgical Pathology Report     Specimen Submitted: A. Colon polyp x3, asc; c snare(1) cbx(2) B. Colon polyp x4, transverse; cbx C. Colon polyp x3, descending; cold snare D. Rectum polyp; cold snare  Clinical History: H/O Colon polyps.   Polyps hemorrhoids and diverticulosis      DIAGNOSIS: A. COLON POLYP X 3, ASCENDING; BIOPSIES: - TUBULAR ADENOMA (MULTIPLE FRAGMENTS). - NEGATIVE FOR HIGH-GRADE DYSPLASIA AND MALIGNANCY.  B. COLON POLYP X 4, TRANSVERSE; BIOPSIES: - FRAGMENTS OF TUBULAR ADENOMA AND BENIGN COLONIC MUCOSA. - NEGATIVE FOR HIGH-GRADE DYSPLASIA AND MALIGNANCY.  C. COLON POLYP X 3, DESCENDING; BIOPSY: - FRAGMENTS OF TUBULAR ADENOMA. - NEGATIVE FOR HIGH-GRADE DYSPLASIA AND MALIGNANCY.  D. RECTUM POLYP; BIOPSY: - HYPERPLASTIC POLYP. - NEGATIVE FOR DYSPLASIA AND MALIGNANCY.   GROSS DESCRIPTION: A. Labeled: Cold snare x 1/cbx x 2 ascending colon polyps Received: Formalin Collect ion time: 11:15 AM on 04/23/2022 Placed into formalin time: 11:15 AM on 04/23/2022 Tissue fragment(s): Multiple Size: Aggregate, 1.1 x 0.7 x 0.1 cm Description: Tan soft tissue fragments Entirely submitted in 1 cassette.  B. Labeled: Cbx transverse colon polyps x 4 Received: Formalin Collection time: 11:25 AM on 04/23/2022 Placed into formalin time: 11:25 AM on 04/23/2022 Tissue fragment(s): Multiple Size: Aggregate, 1.0 x 0.5 x 0.1 cm Description: Pink soft tissue fragments Entirely submitted in 1 cassette.  C. Labeled: Cold snare descending colon polyps x 3 Received: Formalin Collection time: 11:35 AM on 04/23/2022 Placed into formalin time: 11:35 AM on 04/23/2022 Tissue fragment(s): Multiple Size: Aggregate, 1.5 x 0.5 x 0.1 cm Description: Received are tan soft tissue fragments, admixed with intestinal debris.  The ratio of soft tissue to intestinal debris is 60: 40. Entirely submitted in 1 cassette.  D. Labeled: Cold snare rectal polyp Received: Formalin Coll ection time: 11:48 AM on 04/23/2022 Placed into formalin time: 11:48 AM on 04/23/2022 Tissue fragment(s): 1 Size: 0.5 x 0.4 x 0.1 cm Description: Received is a tan soft tissue fragment, admixed with intestinal debris.  The ratio of soft tissue to intestinal  debris is 90: 10. Entirely submitted in 1 cassette.  CM 04/23/2022  Final Diagnosis performed by Raynelle Bring, MD.   Electronically signed 04/26/2022 10:51:39AM The electronic signature indicates that the named Attending Pathologist has evaluated the specimen Technical component performed at [facility], [address], [place], [place] [address] Lab: [phone] Dir: Rush Farmer, MD, MMM  Professional component performed at [facility], [facility], [address], [place], [place] [address] Lab: [phone] Dir: Kathi Simpers, MD       Assessment & Plan:   Problem List Items Addressed This Visit       Cardiovascular and Mediastinum   Hypertension    Chronic. Not well controlled.  Recommend patient check blood pressures at home and bring log to next visit.  Recommend decreasing alcohol intake.  Advised not to take NSAIDS daily.  Will check labs at next visit.  May need to add Amlodipine to regimen.  Follow up in 1 month.  Call sooner if concerns arise.       Relevant Medications   benazepril (LOTENSIN) 40 MG tablet   Atrial fibrillation (HCC) - Primary    Chronic.  Controlled.  Continue with current medication regimen of Carvedilol and Coumadin.  Followed by Cardiology.  Return to clinic in 6 months for reevaluation.  Call sooner if concerns arise.        Relevant Medications   benazepril (LOTENSIN) 40 MG tablet     Digestive   Hepatic cirrhosis, unspecified hepatic cirrhosis type, unspecified whether ascites present [facility])    Will check labs at next visit.         Genitourinary   Stage 3b chronic kidney disease ([place])    Will check labs at next visit.  Patient did not want to check labs today.         Other   Obesity    Recommended eating smaller high protein, low fat meals more frequently and exercising 30 mins a day 5 times a week with a goal of 10-15lb weight loss in the next 3 months.       ETOH abuse    Current everyday drinker.  Discussed  with patient that is likely why his blood pressure is elevated.       Relevant Medications   allopurinol (ZYLOPRIM) 300 MG tablet   Pure hypercholesterolemia    Chronic.  Did not fast for today's appointment.  Would like to wait till next month to get labs drawn.  Follow up in 1 month.  Call sooner if concerns arise.       Relevant Medications   benazepril (LOTENSIN) 40 MG tablet     Follow up plan: Return in about 1 month (around 05/27/2022) for BP Check.

## 2022-04-26 NOTE — Assessment & Plan Note (Signed)
Will check labs at next visit.   

## 2022-04-26 NOTE — Assessment & Plan Note (Signed)
Chronic.  Did not fast for today's appointment.  Would like to wait till next month to get labs drawn.  Follow up in 1 month.  Call sooner if concerns arise.

## 2022-04-26 NOTE — Assessment & Plan Note (Signed)
Current everyday drinker.  Discussed with patient that is likely why his blood pressure is elevated.

## 2022-04-26 NOTE — Assessment & Plan Note (Signed)
Chronic.  Controlled.  Continue with current medication regimen of Carvedilol and Coumadin.  Followed by Cardiology.  Return to clinic in 6 months for reevaluation.  Call sooner if concerns arise.

## 2022-04-26 NOTE — Assessment & Plan Note (Signed)
Chronic. Not well controlled.  Recommend patient check blood pressures at home and bring log to next visit.  Recommend decreasing alcohol intake.  Advised not to take NSAIDS daily.  Will check labs at next visit.  May need to add Amlodipine to regimen.  Follow up in 1 month.  Call sooner if concerns arise.

## 2022-04-26 NOTE — Assessment & Plan Note (Signed)
Recommended eating smaller high protein, low fat meals more frequently and exercising 30 mins a day 5 times a week with a goal of 10-15lb weight loss in the next 3 months.  

## 2022-05-05 ENCOUNTER — Ambulatory Visit: Payer: Medicare Other | Attending: Cardiovascular Disease

## 2022-05-05 DIAGNOSIS — Z5181 Encounter for therapeutic drug level monitoring: Secondary | ICD-10-CM | POA: Insufficient documentation

## 2022-05-05 DIAGNOSIS — I482 Chronic atrial fibrillation, unspecified: Secondary | ICD-10-CM | POA: Insufficient documentation

## 2022-05-05 DIAGNOSIS — Z7901 Long term (current) use of anticoagulants: Secondary | ICD-10-CM | POA: Diagnosis not present

## 2022-05-05 LAB — POCT INR: INR: 3 (ref 2.0–3.0)

## 2022-05-05 NOTE — Patient Instructions (Signed)
resume same dosage of Warfarin 1 tablet every day EXCEPT 1/2 TABLET ON WEDNESDAYS. Recheck INR 8 weeks

## 2022-06-01 ENCOUNTER — Ambulatory Visit (INDEPENDENT_AMBULATORY_CARE_PROVIDER_SITE_OTHER): Payer: Medicare Other | Admitting: Nurse Practitioner

## 2022-06-01 ENCOUNTER — Telehealth: Payer: Self-pay | Admitting: Nurse Practitioner

## 2022-06-01 ENCOUNTER — Encounter: Payer: Self-pay | Admitting: Nurse Practitioner

## 2022-06-01 VITALS — BP 137/77 | HR 86 | Temp 99.5°F | Wt 259.6 lb

## 2022-06-01 DIAGNOSIS — I10 Essential (primary) hypertension: Secondary | ICD-10-CM

## 2022-06-01 NOTE — Assessment & Plan Note (Signed)
Chronic.  Controlled.  Continue with current medication regimen of Benazepril.  L Return to clinic in 6 months for reevaluation.  Call sooner if concerns arise.

## 2022-06-01 NOTE — Telephone Encounter (Signed)
Pt wanted to wait and see Santiago Glad back when she come back from maternity leave.  Pt stated that he will call in to let us know if he needs a refill on any of his medication between now and the next visit.

## 2022-06-01 NOTE — Progress Notes (Signed)
BP 137/77   Pulse 86   Temp 99.5 F (37.5 C) (Oral)   Wt 259 lb 9.6 oz (117.8 kg)   SpO2 97%   BMI 36.21 kg/m    Subjective:    Patient ID: Travis Diaz, male    DOB: Feb 08, 1956, 67 y.o.   MRN: [id number]  HPI: Travis Diaz is a 67 y.o. male  Chief Complaint  Patient presents with   Hypertension   HYPERTENSION without Chronic Kidney Disease Hypertension status: controlled  Satisfied with current treatment? yes Duration of hypertension: years BP monitoring frequency:  daily BP range: 130/70 BP medication side effects:  no Medication compliance: excellent compliance Previous BP meds:benazepril Aspirin: no Recurrent headaches: no Visual changes: no Palpitations: no Dyspnea: no Chest pain: no Lower extremity edema: no Dizzy/lightheaded: no  Relevant past medical, surgical, family and social history reviewed and updated as indicated. Interim medical history since our last visit reviewed. Allergies and medications reviewed and updated.  Review of Systems  Eyes:  Negative for visual disturbance.  Respiratory:  Negative for shortness of breath.   Cardiovascular:  Negative for chest pain and leg swelling.  Neurological:  Negative for light-headedness and headaches.    Per HPI unless specifically indicated above     Objective:    BP 137/77   Pulse 86   Temp 99.5 F (37.5 C) (Oral)   Wt 259 lb 9.6 oz (117.8 kg)   SpO2 97%   BMI 36.21 kg/m   Wt Readings from Last 3 Encounters:  06/01/22 259 lb 9.6 oz (117.8 kg)  04/26/22 265 lb 3.2 oz (120.3 kg)  04/23/22 262 lb (118.8 kg)    Physical Exam Vitals and nursing note reviewed.  Constitutional:      General: He is not in acute distress.    Appearance: Normal appearance. He is obese. He is not ill-appearing, toxic-appearing or diaphoretic.  HENT:     Head: Normocephalic.     Right Ear: External ear normal.     Left Ear: External ear normal.     Nose: Nose normal. No congestion or rhinorrhea.      Mouth/Throat:     Mouth: Mucous membranes are moist.  Eyes:     General:        Right eye: No discharge.        Left eye: No discharge.     Extraocular Movements: Extraocular movements intact.     Conjunctiva/sclera: Conjunctivae normal.     Pupils: Pupils are equal, round, and reactive to light.  Cardiovascular:     Rate and Rhythm: Normal rate and regular rhythm.     Heart sounds: No murmur heard. Pulmonary:     Effort: Pulmonary effort is normal. No respiratory distress.     Breath sounds: Normal breath sounds. No wheezing, rhonchi or rales.  Abdominal:     General: Abdomen is flat. Bowel sounds are normal.  Musculoskeletal:     Cervical back: Normal range of motion and neck supple.  Skin:    General: Skin is warm and dry.     Capillary Refill: Capillary refill takes less than 2 seconds.  Neurological:     General: No focal deficit present.     Mental Status: He is alert and oriented to person, place, and time.  Psychiatric:        Mood and Affect: Mood normal.        Behavior: Behavior normal.        Thought Content: Thought content normal.  Judgment: Judgment normal.     Results for orders placed or performed in visit on 05/05/22  POCT INR  Result Value Ref Range   INR 3.0 2.0 - 3.0   POC INR        Assessment & Plan:   Problem List Items Addressed This Visit       Cardiovascular and Mediastinum   Hypertension - Primary    Chronic.  Controlled.  Continue with current medication regimen of Benazepril.  L Return to clinic in 6 months for reevaluation.  Call sooner if concerns arise.          Follow up plan: Return in about 5 months (around 11/01/2022) for HTN, HLD, DM2 FU.

## 2022-06-30 ENCOUNTER — Ambulatory Visit: Payer: Medicare Other | Attending: Cardiovascular Disease

## 2022-06-30 DIAGNOSIS — I482 Chronic atrial fibrillation, unspecified: Secondary | ICD-10-CM | POA: Diagnosis present

## 2022-06-30 DIAGNOSIS — Z7901 Long term (current) use of anticoagulants: Secondary | ICD-10-CM | POA: Diagnosis present

## 2022-06-30 LAB — POCT INR: INR: 3.3 — AB (ref 2.0–3.0)

## 2022-06-30 MED ORDER — WARFARIN SODIUM 5 MG PO TABS: ORAL_TABLET | ORAL | 2 refills | Status: DC

## 2022-06-30 NOTE — Patient Instructions (Signed)
Description   Skip tomorrow's dosage of Warfarin, then resume same dosage of Warfarin 1 tablet every day EXCEPT 1/2 TABLET ON WEDNESDAYS.  Eat you something green and leafy today.  Recheck INR 6 weeks

## 2022-08-11 ENCOUNTER — Ambulatory Visit: Payer: Medicare Other | Attending: Cardiovascular Disease

## 2022-08-11 DIAGNOSIS — I482 Chronic atrial fibrillation, unspecified: Secondary | ICD-10-CM | POA: Diagnosis present

## 2022-08-11 DIAGNOSIS — Z7901 Long term (current) use of anticoagulants: Secondary | ICD-10-CM

## 2022-08-11 LAB — POCT INR: INR: 4.1 — AB (ref 2.0–3.0)

## 2022-08-11 NOTE — Patient Instructions (Signed)
Skip tomorrow and FRIDAY'S dosage of Warfarin, then resume same dosage of Warfarin 1 tablet every day EXCEPT 1/2 TABLET ON WEDNESDAYS.   Recheck INR 4 weeks

## 2022-09-08 ENCOUNTER — Ambulatory Visit: Payer: Medicare Other | Attending: Cardiovascular Disease

## 2022-09-08 DIAGNOSIS — Z7901 Long term (current) use of anticoagulants: Secondary | ICD-10-CM | POA: Diagnosis present

## 2022-09-08 DIAGNOSIS — I482 Chronic atrial fibrillation, unspecified: Secondary | ICD-10-CM | POA: Diagnosis present

## 2022-09-08 LAB — POCT INR: INR: 3.3 — AB (ref 2.0–3.0)

## 2022-09-08 NOTE — Patient Instructions (Signed)
Description   Skip tomorrow's dosage of Warfarin, then start taking Warfarin 1 tablet every day EXCEPT 1/2 TABLET ON SUNDAYS AND WEDNESDAYS.   Recheck INR 3-4 weeks

## 2022-10-04 ENCOUNTER — Ambulatory Visit (INDEPENDENT_AMBULATORY_CARE_PROVIDER_SITE_OTHER): Payer: Medicare Other

## 2022-10-04 VITALS — Ht 71.0 in | Wt 259.0 lb

## 2022-10-04 DIAGNOSIS — Z0000 Encounter for general adult medical examination without abnormal findings: Secondary | ICD-10-CM

## 2022-10-04 NOTE — Patient Instructions (Signed)
Travis Diaz , Thank you for taking time to come for your Medicare Wellness Visit. I appreciate your ongoing commitment to your health goals. Please review the following plan we discussed and let me know if I can assist you in the future.   These are the goals we discussed:  Goals      DIET - EAT MORE FRUITS AND VEGETABLES     Patient Stated     Staying alive        This is a list of the screening recommended for you and due dates:  Health Maintenance  Topic Date Due   Flu Shot  10/28/2022   Medicare Annual Wellness Visit  10/04/2023   DTaP/Tdap/Td vaccine (3 - Td or Tdap) 01/12/2026   Colon Cancer Screening  04/23/2032   Hepatitis C Screening  Completed   HPV Vaccine  Aged Out   Pneumonia Vaccine  Discontinued   COVID-19 Vaccine  Discontinued   Zoster (Shingles) Vaccine  Discontinued    Advanced directives: no  Conditions/risks identified: none  Next appointment: Follow up in one year for your annual wellness visit. 10/10/23 @ 11:15 am by phone  Preventive Care 65 Years and Older, Male  Preventive care refers to lifestyle choices and visits with your health care provider that can promote health and wellness. What does preventive care include? A yearly physical exam. This is also called an annual well check. Dental exams once or twice a year. Routine eye exams. Ask your health care provider how often you should have your eyes checked. Personal lifestyle choices, including: Daily care of your teeth and gums. Regular physical activity. Eating a healthy diet. Avoiding tobacco and drug use. Limiting alcohol use. Practicing safe sex. Taking low doses of aspirin every day. Taking vitamin and mineral supplements as recommended by your health care provider. What happens during an annual well check? The services and screenings done by your health care provider during your annual well check will depend on your age, overall health, lifestyle risk factors, and family history of  disease. Counseling  Your health care provider may ask you questions about your: Alcohol use. Tobacco use. Drug use. Emotional well-being. Home and relationship well-being. Sexual activity. Eating habits. History of falls. Memory and ability to understand (cognition). Work and work Astronomer. Screening  You may have the following tests or measurements: Height, weight, and BMI. Blood pressure. Lipid and cholesterol levels. These may be checked every 5 years, or more frequently if you are over 38 years old. Skin check. Lung cancer screening. You may have this screening every year starting at age 107 if you have a 30-pack-year history of smoking and currently smoke or have quit within the past 15 years. Fecal occult blood test (FOBT) of the stool. You may have this test every year starting at age 27. Flexible sigmoidoscopy or colonoscopy. You may have a sigmoidoscopy every 5 years or a colonoscopy every 10 years starting at age 82. Prostate cancer screening. Recommendations will vary depending on your family history and other risks. Hepatitis C blood test. Hepatitis B blood test. Sexually transmitted disease (STD) testing. Diabetes screening. This is done by checking your blood sugar (glucose) after you have not eaten for a while (fasting). You may have this done every 1-3 years. Abdominal aortic aneurysm (AAA) screening. You may need this if you are a current or former smoker. Osteoporosis. You may be screened starting at age 22 if you are at high risk. Talk with your health care provider about your  test results, treatment options, and if necessary, the need for more tests. Vaccines  Your health care provider may recommend certain vaccines, such as: Influenza vaccine. This is recommended every year. Tetanus, diphtheria, and acellular pertussis (Tdap, Td) vaccine. You may need a Td booster every 10 years. Zoster vaccine. You may need this after age 67. Pneumococcal 13-valent  conjugate (PCV13) vaccine. One dose is recommended after age 69. Pneumococcal polysaccharide (PPSV23) vaccine. One dose is recommended after age 32. Talk to your health care provider about which screenings and vaccines you need and how often you need them. This information is not intended to replace advice given to you by your health care provider. Make sure you discuss any questions you have with your health care provider. Document Released: 04/11/2015 Document Revised: 12/03/2015 Document Reviewed: 01/14/2015 Elsevier Interactive Patient Education  2017 [employer].  Fall Prevention in the Home Falls can cause injuries. They can happen to people of all ages. There are many things you can do to make your home safe and to help prevent falls. What can I do on the outside of my home? Regularly fix the edges of walkways and driveways and fix any cracks. Remove anything that might make you trip as you walk through a door, such as a raised step or threshold. Trim any bushes or trees on the path to your home. Use bright outdoor lighting. Clear any walking paths of anything that might make someone trip, such as rocks or tools. Regularly check to see if handrails are loose or broken. Make sure that both sides of any steps have handrails. Any raised decks and porches should have guardrails on the edges. Have any leaves, snow, or ice cleared regularly. Use sand or salt on walking paths during winter. Clean up any spills in your garage right away. This includes oil or grease spills. What can I do in the bathroom? Use night lights. Install grab bars by the toilet and in the tub and shower. Do not use towel bars as grab bars. Use non-skid mats or decals in the tub or shower. If you need to sit down in the shower, use a plastic, non-slip stool. Keep the floor dry. Clean up any water that spills on the floor as soon as it happens. Remove soap buildup in the tub or shower regularly. Attach bath mats  securely with double-sided non-slip rug tape. Do not have throw rugs and other things on the floor that can make you trip. What can I do in the bedroom? Use night lights. Make sure that you have a light by your bed that is easy to reach. Do not use any sheets or blankets that are too big for your bed. They should not hang down onto the floor. Have a firm chair that has side arms. You can use this for support while you get dressed. Do not have throw rugs and other things on the floor that can make you trip. What can I do in the kitchen? Clean up any spills right away. Avoid walking on wet floors. Keep items that you use a lot in easy-to-reach places. If you need to reach something above you, use a strong step stool that has a grab bar. Keep electrical cords out of the way. Do not use floor polish or wax that makes floors slippery. If you must use wax, use non-skid floor wax. Do not have throw rugs and other things on the floor that can make you trip. What can I do with my  stairs? Do not leave any items on the stairs. Make sure that there are handrails on both sides of the stairs and use them. Fix handrails that are broken or loose. Make sure that handrails are as long as the stairways. Check any carpeting to make sure that it is firmly attached to the stairs. Fix any carpet that is loose or worn. Avoid having throw rugs at the top or bottom of the stairs. If you do have throw rugs, attach them to the floor with carpet tape. Make sure that you have a light switch at the top of the stairs and the bottom of the stairs. If you do not have them, ask someone to add them for you. What else can I do to help prevent falls? Wear shoes that: Do not have high heels. Have rubber bottoms. Are comfortable and fit you well. Are closed at the toe. Do not wear sandals. If you use a stepladder: Make sure that it is fully opened. Do not climb a closed stepladder. Make sure that both sides of the stepladder  are locked into place. Ask someone to hold it for you, if possible. Clearly mark and make sure that you can see: Any grab bars or handrails. First and last steps. Where the edge of each step is. Use tools that help you move around (mobility aids) if they are needed. These include: Canes. Walkers. Scooters. Crutches. Turn on the lights when you go into a dark area. Replace any light bulbs as soon as they burn out. Set up your furniture so you have a clear path. Avoid moving your furniture around. If any of your floors are uneven, fix them. If there are any pets around you, be aware of where they are. Review your medicines with your doctor. Some medicines can make you feel dizzy. This can increase your chance of falling. Ask your doctor what other things that you can do to help prevent falls. This information is not intended to replace advice given to you by your health care provider. Make sure you discuss any questions you have with your health care provider. Document Released: 01/09/2009 Document Revised: 08/21/2015 Document Reviewed: 04/19/2014 Elsevier Interactive Patient Education  2017 [employer].

## 2022-10-04 NOTE — Progress Notes (Signed)
Subjective:   Travis Diaz is a 67 y.o. male who presents for Medicare Annual/Subsequent preventive examination.  Visit Complete: Virtual  I connected with  Travis Diaz on 10/04/22 by a audio enabled telemedicine application and verified that I am speaking with the correct person using two identifiers.  Patient Location: Home  Provider Location: Office/Clinic  I discussed the limitations of evaluation and management by telemedicine. The patient expressed understanding and agreed to proceed.   Review of Systems     Cardiac Risk Factors include: advanced age (>72men, >75 women);hypertension;male gender;sedentary lifestyle- obesity     Objective:    Today's Vitals   10/04/22 1144  Weight: 259 lb (117.5 kg)  Height: 5\' 11"  (1.803 m)   Body mass index is 36.12 kg/m.     10/04/2022   11:36 AM 04/23/2022    9:24 AM 09/17/2021   12:08 PM 05/14/2021    9:46 PM 05/14/2021    7:55 AM  Advanced Directives  Does Patient Have a Medical Advance Directive? No No No  No  Would patient like information on creating a medical advance directive? No - Patient declined   No - Patient declined     Current Medications (verified) Outpatient Encounter Medications as of 10/04/2022  Medication Sig   allopurinol (ZYLOPRIM) 300 MG tablet TAKE 1 TABLET BY MOUTH ONCE DAILY . APPOINTMENT REQUIRED FOR FUTURE REFILLS   benazepril (LOTENSIN) 40 MG tablet Take 1 tablet (40 mg total) by mouth daily.   carvedilol (COREG) 6.25 MG tablet Take 1 tablet by mouth twice daily   Flaxseed, Linseed, (FLAXSEED OIL) 1200 MG CAPS Take 1,000 mg by mouth daily.    meloxicam (MOBIC) 7.5 MG tablet Take 7.5 mg by mouth as needed.   warfarin (COUMADIN) 5 MG tablet Take 1/2 a tablet to 1 tablet by mouth daily as directed by the coumadin clinic.   No facility-administered encounter medications on file as of 10/04/2022.    Allergies (verified) Elemental sulfur   History: Past Medical History:  Diagnosis Date    A-fib (HCC)    Acute GI bleeding 05/14/2021   Gout    Hypertension    Past Surgical History:  Procedure Laterality Date   broken arm     COLONOSCOPY WITH PROPOFOL N/A 05/14/2016   Procedure: COLONOSCOPY WITH PROPOFOL;  Surgeon: Travis Diaz;  Location: [facility] ENDOSCOPY;  Service: Endoscopy;  Laterality: N/A;   COLONOSCOPY WITH PROPOFOL N/A 04/23/2022   Procedure: COLONOSCOPY WITH PROPOFOL;  Surgeon: Travis Diaz;  Location: [facility] ENDOSCOPY;  Service: Gastroenterology;  Laterality: N/A;   ESOPHAGOGASTRODUODENOSCOPY (EGD) WITH PROPOFOL N/A 05/14/2021   Procedure: ESOPHAGOGASTRODUODENOSCOPY (EGD) WITH PROPOFOL;  Surgeon: Travis Diaz;  Location: [facility] ENDOSCOPY;  Service: Gastroenterology;  Laterality: N/A;   upper leg procedure Right    removal of blood clots   Family History  Problem Relation Age of Onset   Cancer Mother        male   Heart disease Father    Cancer Father    Early death Father    Heart attack Father    Diabetes Sister    Dementia Maternal Grandmother    Heart attack Maternal Grandfather    Heart attack Paternal Grandmother    Early death Paternal Grandfather    Heart attack Paternal Grandfather    Social History   Socioeconomic History   Marital status: Married    Spouse name: Not on file   Number of children: Not on file  Years of education: Not on file   Highest education level: Not on file  Occupational History   Not on file  Tobacco Use   Smoking status: Former    Types: Cigarettes    Quit date: 03/29/2004    Years since quitting: 18.5   Smokeless tobacco: Never  Vaping Use   Vaping Use: Never used  Substance and Sexual Activity   Alcohol use: Yes    Alcohol/week: 24.0 standard drinks of alcohol    Types: 24 Cans of beer per week   Drug use: No   Sexual activity: Yes  Other Topics Concern   Not on file  Social History Narrative   Not on file   Social Determinants of Health   Financial Resource Strain: Low  Risk  (10/04/2022)   Overall Financial Resource Strain (CARDIA)    Difficulty of Paying Living Expenses: Not hard at all  Food Insecurity: No Food Insecurity (10/04/2022)   Hunger Vital Sign    Worried About Running Out of Food in the Last Year: Never true    Ran Out of Food in the Last Year: Never true  Transportation Needs: No Transportation Needs (10/04/2022)   PRAPARE - Administrator, Civil Service (Medical): No    Lack of Transportation (Non-Medical): No  Physical Activity: Inactive (10/04/2022)   Exercise Vital Sign    Days of Exercise per Week: 0 days    Minutes of Exercise per Session: 0 min  Stress: No Stress Concern Present (10/04/2022)   [employer] of Occupational Health - Occupational Stress Questionnaire    Feeling of Stress : Not at all  Social Connections: Socially Isolated (10/04/2022)   Social Connection and Isolation Panel [NHANES]    Frequency of Communication with Friends and Family: Once a week    Frequency of Social Gatherings with Friends and Family: Once a week    Attends Religious Services: Never    Database administrator or Organizations: No    Attends Engineer, structural: Never    Marital Status: Married    Tobacco Counseling Counseling given: Not Answered   Clinical Intake:  Pre-visit preparation completed: Yes  Pain : No/denies pain     Nutritional Risks: None Diabetes: No  How often Diaz you need to have someone help you when you read instructions, pamphlets, or other written materials from your doctor or pharmacy?: 1 - Never  Interpreter Needed?: No  Information entered by :: Kennedy Bucker, LPN   Activities of Daily Living    10/04/2022   11:37 AM  In your present state of health, Diaz you have any difficulty performing the following activities:  Hearing? 0  Vision? 0  Difficulty concentrating or making decisions? 0  Walking or climbing stairs? 0  Dressing or bathing? 0  Doing errands, shopping? 0  Preparing Food  and eating ? N  Using the Toilet? N  In the past six months, have you accidently leaked urine? N  Diaz you have problems with loss of bowel control? N  Managing your Medications? N  Managing your Finances? N  Housekeeping or managing your Housekeeping? N    Patient Care Team: Larae Grooms, NP as PCP - General (Nurse Practitioner) [place] Ouch, Diaz as PCP - Cardiology (Cardiology)  Indicate any recent Medical Services you may have received from other than [facility] providers in the past year (date may be approximate).     Assessment:   This is a routine wellness examination for [employer].  Hearing/Vision screen Hearing Screening - Comments:: No aids Vision Screening - Comments:: No aids-   Dietary issues and exercise activities discussed:     Goals Addressed             This Visit's Progress    DIET - EAT MORE FRUITS AND VEGETABLES         Depression Screen    10/04/2022   11:34 AM 06/01/2022    9:57 AM 04/26/2022   10:54 AM 01/21/2022    9:09 AM 09/17/2021   12:10 PM 09/02/2021    9:47 AM 07/22/2021    8:49 AM  PHQ 2/9 Scores  PHQ - 2 Score 0 0 0 1 0 0 0  PHQ- 9 Score 0 0 0 1 0 0 2    Fall Risk    10/04/2022   11:36 AM 06/01/2022    9:57 AM 04/26/2022   10:54 AM 01/21/2022    9:09 AM 09/17/2021   12:06 PM  Fall Risk   Falls in the past year? 0 0 0 0 0  Number falls in past yr: 0 0 0 0 0  Injury with Fall? 0 0 0 0 0  Risk for fall due to : History of fall(s) No Fall Risks No Fall Risks No Fall Risks   Follow up Falls prevention discussed;Falls evaluation completed Falls evaluation completed Falls evaluation completed Falls evaluation completed Falls evaluation completed;Education provided;Falls prevention discussed    MEDICARE RISK AT HOME:  Medicare Risk at Home - 10/04/22 1137     Any stairs in or around the home? Yes    If so, are there any without handrails? Yes    Home free of loose throw rugs in walkways, pet beds, electrical cords, etc? Yes    Adequate  lighting in your home to reduce risk of falls? Yes    Life alert? No    Use of a cane, walker or w/c? No   when gout flares will use cane   Grab bars in the bathroom? No    Shower chair or bench in shower? Yes    Elevated toilet seat or a handicapped toilet? No             TIMED UP AND GO:  Was the test performed?  No    Cognitive Function:        10/04/2022   11:38 AM 09/17/2021   12:07 PM  6CIT Screen  What Year? 0 points 0 points  What month? 0 points 0 points  What time? 0 points 0 points  Count back from 20 0 points 0 points  Months in reverse 0 points 0 points  Repeat phrase 0 points 0 points  Total Score 0 points 0 points    Immunizations Immunization History  Administered Date(s) Administered   Td 03/29/2002   Tdap 01/13/2016    TDAP status: Up to date  Flu Vaccine status: Declined, Education has been provided regarding the importance of this vaccine but patient still declined. Advised may receive this vaccine at local pharmacy or Health Dept. Aware to provide a copy of the vaccination record if obtained from local pharmacy or Health Dept. Verbalized acceptance and understanding.  Pneumococcal vaccine status: Declined,  Education has been provided regarding the importance of this vaccine but patient still declined. Advised may receive this vaccine at local pharmacy or Health Dept. Aware to provide a copy of the vaccination record if obtained from local pharmacy or Health Dept. Verbalized acceptance and understanding.  Covid-19 vaccine status: Declined, Education has been provided regarding the importance of this vaccine but patient still declined. Advised may receive this vaccine at local pharmacy or Health Dept.or vaccine clinic. Aware to provide a copy of the vaccination record if obtained from local pharmacy or Health Dept. Verbalized acceptance and understanding.  Qualifies for Shingles Vaccine? Yes   Zostavax completed No   Shingrix Completed?: No.     Education has been provided regarding the importance of this vaccine. Patient has been advised to call insurance company to determine out of pocket expense if they have not yet received this vaccine. Advised may also receive vaccine at local pharmacy or Health Dept. Verbalized acceptance and understanding.  Screening Tests Health Maintenance  Topic Date Due   INFLUENZA VACCINE  10/28/2022   Medicare Annual Wellness (AWV)  10/04/2023   DTaP/Tdap/Td (3 - Td or Tdap) 01/12/2026   Colonoscopy  04/23/2032   Hepatitis C Screening  Completed   HPV VACCINES  Aged Out   Pneumonia Vaccine 38+ Years old  Discontinued   COVID-19 Vaccine  Discontinued   Zoster Vaccines- Shingrix  Discontinued    Health Maintenance  There are no preventive care reminders to display for this patient.   Colorectal cancer screening: Type of screening: Colonoscopy. Completed 04/23/22. Repeat every 1 years  Lung Cancer Screening: (Low Dose CT Chest recommended if Age 30-80 years, 20 pack-year currently smoking OR have quit w/in 15years.) does not qualify.    Additional Screening:  Hepatitis C Screening: does qualify; Completed 01/17/17  Vision Screening: Recommended annual ophthalmology exams for early detection of glaucoma and other disorders of the eye. Is the patient up to date with their annual eye exam?  No  Who is the provider or what is the name of the office in which the patient attends annual eye exams? No one If pt is not established with a provider, would they like to be referred to a provider to establish care? No .   Dental Screening: Recommended annual dental exams for proper oral hygiene   Community Resource Referral / Chronic Care Management: CRR required this visit?  No   CCM required this visit?  No     Plan:     I have personally reviewed and noted the following in the patient's chart:   Medical and social history Use of alcohol, tobacco or illicit drugs  Current medications and  supplements including opioid prescriptions. Patient is not currently taking opioid prescriptions. Functional ability and status Nutritional status Physical activity Advanced directives List of other physicians Hospitalizations, surgeries, and ER visits in previous 12 months Vitals Screenings to include cognitive, depression, and falls Referrals and appointments  In addition, I have reviewed and discussed with patient certain preventive protocols, quality metrics, and best practice recommendations. A written personalized care plan for preventive services as well as general preventive health recommendations were provided to patient.     Hal Hope, LPN   04/03/1094   After Visit Summary: (MyChart) Due to this being a telephonic visit, the after visit summary with patients personalized plan was offered to patient via MyChart   Nurse Notes: none

## 2022-10-06 ENCOUNTER — Ambulatory Visit: Payer: Medicare Other | Attending: Cardiovascular Disease

## 2022-10-06 DIAGNOSIS — Z7901 Long term (current) use of anticoagulants: Secondary | ICD-10-CM | POA: Insufficient documentation

## 2022-10-06 DIAGNOSIS — I482 Chronic atrial fibrillation, unspecified: Secondary | ICD-10-CM | POA: Diagnosis not present

## 2022-10-06 LAB — POCT INR: INR: 3.6 — AB (ref 2.0–3.0)

## 2022-10-06 MED ORDER — WARFARIN SODIUM 5 MG PO TABS: ORAL_TABLET | ORAL | 2 refills | Status: DC

## 2022-10-06 NOTE — Patient Instructions (Signed)
Skip tomorrow's dosage of Warfarin, then CONTINUE taking Warfarin 1 tablet every day EXCEPT 1/2 TABLET ON SUNDAYS AND WEDNESDAYS.   Recheck INR 4 weeks

## 2022-11-03 ENCOUNTER — Ambulatory Visit: Payer: Medicare Other | Attending: Cardiovascular Disease

## 2022-11-03 DIAGNOSIS — Z7901 Long term (current) use of anticoagulants: Secondary | ICD-10-CM | POA: Insufficient documentation

## 2022-11-03 DIAGNOSIS — I482 Chronic atrial fibrillation, unspecified: Secondary | ICD-10-CM | POA: Insufficient documentation

## 2022-11-03 LAB — POCT INR: INR: 2 (ref 2.0–3.0)

## 2022-11-03 NOTE — Patient Instructions (Addendum)
CONTINUE taking Warfarin 1 tablet every day EXCEPT 1/2 TABLET ON SUNDAYS AND WEDNESDAYS.   Recheck INR 8 weeks

## 2022-12-28 ENCOUNTER — Ambulatory Visit (INDEPENDENT_AMBULATORY_CARE_PROVIDER_SITE_OTHER): Payer: Medicare Other | Admitting: Physician Assistant

## 2022-12-28 ENCOUNTER — Encounter: Payer: Self-pay | Admitting: Physician Assistant

## 2022-12-28 VITALS — BP 132/82 | HR 75 | Temp 98.0°F | Resp 16 | Ht 71.0 in | Wt 246.4 lb

## 2022-12-28 DIAGNOSIS — E7800 Pure hypercholesterolemia, unspecified: Secondary | ICD-10-CM

## 2022-12-28 DIAGNOSIS — I4891 Unspecified atrial fibrillation: Secondary | ICD-10-CM

## 2022-12-28 DIAGNOSIS — E875 Hyperkalemia: Secondary | ICD-10-CM

## 2022-12-28 DIAGNOSIS — F1010 Alcohol abuse, uncomplicated: Secondary | ICD-10-CM | POA: Diagnosis not present

## 2022-12-28 DIAGNOSIS — I10 Essential (primary) hypertension: Secondary | ICD-10-CM | POA: Diagnosis not present

## 2022-12-28 MED ORDER — BENAZEPRIL HCL 40 MG PO TABS: 40.0000 mg | ORAL_TABLET | Freq: Every day | ORAL | 1 refills | Status: DC

## 2022-12-28 MED ORDER — CARVEDILOL 6.25 MG PO TABS: 6.2500 mg | ORAL_TABLET | Freq: Two times a day (BID) | ORAL | 2 refills | Status: DC

## 2022-12-28 MED ORDER — ALLOPURINOL 300 MG PO TABS: ORAL_TABLET | ORAL | 1 refills | Status: DC

## 2022-12-28 NOTE — Progress Notes (Unsigned)
Established Patient Office Visit  Name: Travis Diaz   MRN: [id number]    DOB: [birth year]   Date:12/29/2022  Today's Provider: Jacquelin Hawking, MHS, PA-C Introduced myself to the patient as a PA-C and provided education on APPs in clinical practice.         Subjective  Chief Complaint  Chief Complaint  Patient presents with   Hypertension   Hyperlipidemia   Atrial Fibrillation    HPI   HYPERTENSION / HYPERLIPIDEMIA/ atrial fibrillation  Satisfied with current treatment? yes Duration of hypertension: years BP monitoring frequency: a few times a month BP range: 120s/80s  BP medication side effects: no Past BP meds: benazepril and carvedilol Duration of hyperlipidemia: years Cholesterol medication side effects: no Cholesterol supplements: none Past cholesterol medications: none Medication compliance: good compliance Aspirin: no Recent stressors: no Recurrent headaches: no Visual changes: no Palpitations: no Dyspnea: no Chest pain: no Lower extremity edema: no Dizzy/lightheaded: no  Warfarin is managed by Anticoag clinic- next due for check later this month    He is drinking a "couple cases per week"   Stopped smoking in 2006    Patient Active Problem List   Diagnosis Date Noted   Hepatic cirrhosis, unspecified hepatic cirrhosis type, unspecified whether ascites present (HCC) 04/26/2022   Stage 3b chronic kidney disease (HCC) 04/26/2022   Pure hypercholesterolemia 03/11/2021   Long term (current) use of anticoagulants 02/25/2021   ETOH abuse 01/22/2021   Atrial fibrillation (HCC) 02/12/2020   Obesity 08/10/2019   Arthritis of both knees 01/09/2015   Hypertension 11/27/2014   Acute gout 09/20/2014    Past Surgical History:  Procedure Laterality Date   broken arm     COLONOSCOPY WITH PROPOFOL N/A 05/14/2016   Procedure: COLONOSCOPY WITH PROPOFOL;  Surgeon: Scot Jun, MD;  Location: [facility] ENDOSCOPY;  Service: Endoscopy;  Laterality: N/A;    COLONOSCOPY WITH PROPOFOL N/A 04/23/2022   Procedure: COLONOSCOPY WITH PROPOFOL;  Surgeon: Jaynie Collins, DO;  Location: [facility] ENDOSCOPY;  Service: Gastroenterology;  Laterality: N/A;   ESOPHAGOGASTRODUODENOSCOPY (EGD) WITH PROPOFOL N/A 05/14/2021   Procedure: ESOPHAGOGASTRODUODENOSCOPY (EGD) WITH PROPOFOL;  Surgeon: Jaynie Collins, DO;  Location: [facility] ENDOSCOPY;  Service: Gastroenterology;  Laterality: N/A;   upper leg procedure Right    removal of blood clots    Family History  Problem Relation Age of Onset   Cancer Mother        male   Heart disease Father    Cancer Father    Early death Father    Heart attack Father    Diabetes Sister    Dementia Maternal Grandmother    Heart attack Maternal Grandfather    Heart attack Paternal Grandmother    Early death Paternal Grandfather    Heart attack Paternal Grandfather     Social History   Tobacco Use   Smoking status: Former    Current packs/day: 0.00    Types: Cigarettes    Quit date: 03/29/2004    Years since quitting: 18.7   Smokeless tobacco: Never  Substance Use Topics   Alcohol use: Yes    Alcohol/week: 24.0 standard drinks of alcohol    Types: 24 Cans of beer per week     Current Outpatient Medications:    Flaxseed, Linseed, (FLAXSEED OIL) 1200 MG CAPS, Take 1,000 mg by mouth daily. , Disp: , Rfl:    meloxicam (MOBIC) 7.5 MG tablet, Take 7.5 mg by mouth as needed., Disp: , Rfl:  warfarin (COUMADIN) 5 MG tablet, Take 1/2 a tablet to 1 tablet by mouth daily as directed by the coumadin clinic., Disp: 35 tablet, Rfl: 2   allopurinol (ZYLOPRIM) 300 MG tablet, TAKE 1 TABLET BY MOUTH ONCE DAILY . APPOINTMENT REQUIRED FOR FUTURE REFILLS, Disp: 90 tablet, Rfl: 1   benazepril (LOTENSIN) 40 MG tablet, Take 1 tablet (40 mg total) by mouth daily., Disp: 90 tablet, Rfl: 1   carvedilol (COREG) 6.25 MG tablet, Take 1 tablet (6.25 mg total) by mouth 2 (two) times daily., Disp: 180 tablet, Rfl: 2  Allergies   Allergen Reactions   Elemental Sulfur Other (See Comments)    Was told by his mother    I personally reviewed active problem list, medication list, allergies, health maintenance, notes from last encounter, lab results with the patient/caregiver today.   Review of Systems  Eyes:  Negative for blurred vision, double vision and photophobia.  Respiratory:  Negative for shortness of breath and wheezing.   Cardiovascular:  Negative for chest pain, palpitations and leg swelling.  Musculoskeletal:  Negative for falls.  Neurological:  Negative for dizziness, tingling, loss of consciousness and headaches.      Objective  Vitals:   12/28/22 1045  BP: 132/82  Pulse: 75  Resp: 16  Temp: 98 F (36.7 C)  TempSrc: Oral  SpO2: 97%  Weight: 246 lb 6.4 oz (111.8 kg)  Height: 5\' 11"  (1.803 m)    Body mass index is 34.37 kg/m.  Physical Exam Vitals reviewed.  Constitutional:      General: He is awake.     Appearance: Normal appearance. He is well-developed and well-groomed.  HENT:     Head: Normocephalic and atraumatic.  Cardiovascular:     Rate and Rhythm: Normal rate. Rhythm irregularly irregular.     Pulses: Normal pulses.          Radial pulses are 2+ on the right side and 2+ on the left side.     Heart sounds: Normal heart sounds. No murmur heard.    No friction rub. No gallop.  Pulmonary:     Effort: Pulmonary effort is normal.     Breath sounds: Normal breath sounds. No decreased air movement. No decreased breath sounds, wheezing, rhonchi or rales.  Musculoskeletal:     Cervical back: Normal range of motion and neck supple.     Right lower leg: No edema.     Left lower leg: No edema.  Neurological:     General: No focal deficit present.     Mental Status: He is alert and oriented to person, place, and time.  Psychiatric:        Mood and Affect: Mood normal.        Behavior: Behavior normal. Behavior is cooperative.        Thought Content: Thought content normal.       Recent Results (from the past 2160 hour(s))  POCT INR     Status: Abnormal   Collection Time: 10/06/22 11:00 AM  Result Value Ref Range   INR 3.6 (A) 2.0 - 3.0   POC INR    POCT INR     Status: None   Collection Time: 11/03/22 11:11 AM  Result Value Ref Range   INR 2.0 2.0 - 3.0   POC INR    COMPLETE METABOLIC PANEL WITH GFR     Status: Abnormal   Collection Time: 12/28/22 11:32 AM  Result Value Ref Range   Glucose, Bld 97 65 - 99  mg/dL    Comment: .            Fasting reference interval .    BUN 11 7 - 25 mg/dL   Creat 8.29 5.62 - 1.30 mg/dL   eGFR 72 > OR = 60 QM/VHQ/4.69G2   BUN/Creatinine Ratio SEE NOTE: 6 - 22 (calc)    Comment:    Not Reported: BUN and Creatinine are within    reference range. .    Sodium 132 (L) 135 - 146 mmol/L   Potassium 6.4 (HH) 3.5 - 5.3 mmol/L    Comment: Verified by repeat analysis. . . Result consistent with prolonged exposure to red blood cells. Interpret result with caution. .    Chloride 98 98 - 110 mmol/L   CO2 27 20 - 32 mmol/L   Calcium 9.7 8.6 - 10.3 mg/dL   Total Protein 6.8 6.1 - 8.1 g/dL   Albumin 4.3 3.6 - 5.1 g/dL   Globulin 2.5 1.9 - 3.7 g/dL (calc)   AG Ratio 1.7 1.0 - 2.5 (calc)   Total Bilirubin 0.5 0.2 - 1.2 mg/dL   Alkaline phosphatase (APISO) 60 35 - 144 U/L   AST 19 10 - 35 U/L   ALT 22 9 - 46 U/L  CBC w/Diff/Platelet     Status: Abnormal   Collection Time: 12/28/22 11:32 AM  Result Value Ref Range   WBC 7.1 3.8 - 10.8 Thousand/uL   RBC 5.45 4.20 - 5.80 Million/uL   Hemoglobin 17.4 (H) 13.2 - 17.1 g/dL   HCT 95.2 (H) 84.1 - 32.4 %   MCV 95.4 80.0 - 100.0 fL   MCH 31.9 27.0 - 33.0 pg   MCHC 33.5 32.0 - 36.0 g/dL    Comment: For adults, a slight decrease in the calculated MCHC value (in the range of 30 to 32 g/dL) is most likely not clinically significant; however, it should be interpreted with caution in correlation with other red cell parameters and the patient's clinical condition.    RDW  14.2 11.0 - 15.0 %   Platelets 297 140 - 400 Thousand/uL   MPV 9.0 7.5 - 12.5 fL   Neutro Abs 5,069 1,500 - 7,800 cells/uL   Lymphs Abs 1,164 850 - 3,900 cells/uL   Absolute Monocytes 632 200 - 950 cells/uL   Eosinophils Absolute 192 15 - 500 cells/uL   Basophils Absolute 43 0 - 200 cells/uL   Neutrophils Relative % 71.4 %   Total Lymphocyte 16.4 %   Monocytes Relative 8.9 %   Eosinophils Relative 2.7 %   Basophils Relative 0.6 %  Lipid Profile     Status: Abnormal   Collection Time: 12/28/22 11:32 AM  Result Value Ref Range   Cholesterol 200 (H) <200 mg/dL   HDL 70 > OR = 40 mg/dL   Triglycerides 401 <027 mg/dL   LDL Cholesterol (Calc) 105 (H) mg/dL (calc)    Comment: Reference range: <100 . Desirable range <100 mg/dL for primary prevention;   <70 mg/dL for patients with CHD or diabetic patients  with > or = 2 CHD risk factors. Marland Kitchen LDL-C is now calculated using the Martin-Hopkins  calculation, which is a validated novel method providing  better accuracy than the Friedewald equation in the  estimation of LDL-C.  Horald Pollen et al. Lenox Ahr. 2536;644(03): 2061-2068  (http://education.[facility].com/faq/FAQ164)    Total CHOL/HDL Ratio 2.9 <5.0 (calc)   Non-HDL Cholesterol (Calc) 130 (H) <130 mg/dL (calc)    Comment: For patients with diabetes plus 1 major  ASCVD risk  factor, treating to a non-HDL-C goal of <100 mg/dL  (LDL-C of <16 mg/dL) is considered a therapeutic  option.      PHQ2/9:    12/28/2022   10:45 AM 10/04/2022   11:34 AM 06/01/2022    9:57 AM 04/26/2022   10:54 AM 01/21/2022    9:09 AM  Depression screen PHQ 2/9  Decreased Interest 0 0 0 0 1  Down, Depressed, Hopeless 0 0 0 0 0  PHQ - 2 Score 0 0 0 0 1  Altered sleeping 0 0 0 0 0  Tired, decreased energy 0 0 0 0 0  Change in appetite 0 0 0 0 0  Feeling bad or failure about yourself  0 0 0 0 0  Trouble concentrating 0 0 0 0 0  Moving slowly or fidgety/restless 0 0 0 0 0  Suicidal thoughts 0 0 0 0 0   PHQ-9 Score 0 0 0 0 1  Difficult doing work/chores Not difficult at all Not difficult at all Not difficult at all Not difficult at all Not difficult at all      Fall Risk:    12/28/2022   10:45 AM 10/04/2022   11:36 AM 06/01/2022    9:57 AM 04/26/2022   10:54 AM 01/21/2022    9:09 AM  Fall Risk   Falls in the past year? 0 0 0 0 0  Number falls in past yr: 0 0 0 0 0  Injury with Fall? 0 0 0 0 0  Risk for fall due to : No Fall Risks History of fall(s) No Fall Risks No Fall Risks No Fall Risks  Follow up Falls prevention discussed;Education provided;Falls evaluation completed Falls prevention discussed;Falls evaluation completed Falls evaluation completed Falls evaluation completed Falls evaluation completed      Functional Status Survey: Is the patient deaf or have difficulty hearing?: Yes Does the patient have difficulty seeing, even when wearing glasses/contacts?: Yes Does the patient have difficulty concentrating, remembering, or making decisions?: No Does the patient have difficulty walking or climbing stairs?: No Does the patient have difficulty dressing or bathing?: No Does the patient have difficulty doing errands alone such as visiting a doctor's office or shopping?: No    Assessment & Plan  Problem List Items Addressed This Visit       Cardiovascular and Mediastinum   Hypertension - Primary    Chronic, historic condition Appears controlled on current regimen comprised of Benazepril 40 mg PO every day and Carvedilol 6.25 mg PO BID Continue with current regimen Follow up in 6 months or sooner if concerns arise        Relevant Medications   benazepril (LOTENSIN) 40 MG tablet   carvedilol (COREG) 6.25 MG tablet   Other Relevant Orders   COMPLETE METABOLIC PANEL WITH GFR (Completed)   CBC w/Diff/Platelet (Completed)   Atrial fibrillation (HCC)    Chronic, historic condition  Appears well managed on current regimen of Carvedilol 6.25 mg PO BID He is on Coumadin  and managed by anticoag clinic - will defer to their management recommendations Continue collaboration with Cardiology  Follow up in 6 months or sooner if concerns arise        Relevant Medications   benazepril (LOTENSIN) 40 MG tablet   carvedilol (COREG) 6.25 MG tablet     Other   ETOH abuse    Chronic, ongoing He reports he is drinking several cases per week Will check CMP for liver enzymes. May need to order  Abdominal US for liver evaluation Recommend he cuts back on this in progressive manner to avoid withdrawal. Follow up in 6 months or sooner if concerns arise        Relevant Medications   allopurinol (ZYLOPRIM) 300 MG tablet   Pure hypercholesterolemia    Chronic, historic condition Not currently taking statin and he is not fasting for labs today Will check lipid panel today as it has been awhile since it was checked Results to dictate further management  Follow up in 6 months or sooner if concerns arise        Relevant Medications   benazepril (LOTENSIN) 40 MG tablet   carvedilol (COREG) 6.25 MG tablet   Other Relevant Orders   Lipid Profile (Completed)   Other Visit Diagnoses     Acute hyperkalemia     Noted on 12/29/2022  Will  reach out to patient for stat labs at the hospital and nurse visit for EKG given irregular heartbeat on exam yesterday or he will need to go to ED for eval    Relevant Orders   Comprehensive metabolic panel        Return in about 6 months (around 06/28/2023) for HLD, HTN, A fib, EtOH.   I, Lucinda Spells E Genetta Fiero, PA-C, have reviewed all documentation for this visit. The documentation on 12/29/22 for the exam, diagnosis, procedures, and orders are all accurate and complete.   Jacquelin Hawking, MHS, PA-C [facility]

## 2022-12-29 ENCOUNTER — Telehealth: Payer: Self-pay | Admitting: Nurse Practitioner

## 2022-12-29 ENCOUNTER — Ambulatory Visit: Payer: Self-pay | Admitting: *Deleted

## 2022-12-29 ENCOUNTER — Ambulatory Visit: Payer: Medicare Other | Attending: Cardiovascular Disease

## 2022-12-29 DIAGNOSIS — Z7901 Long term (current) use of anticoagulants: Secondary | ICD-10-CM | POA: Diagnosis not present

## 2022-12-29 DIAGNOSIS — I482 Chronic atrial fibrillation, unspecified: Secondary | ICD-10-CM | POA: Insufficient documentation

## 2022-12-29 LAB — COMPLETE METABOLIC PANEL WITH GFR
AG Ratio: 1.7 (calc) (ref 1.0–2.5)
ALT: 22 U/L (ref 9–46)
AST: 19 U/L (ref 10–35)
Albumin: 4.3 g/dL (ref 3.6–5.1)
Alkaline phosphatase (APISO): 60 U/L (ref 35–144)
BUN: 11 mg/dL (ref 7–25)
CO2: 27 mmol/L (ref 20–32)
Calcium: 9.7 mg/dL (ref 8.6–10.3)
Chloride: 98 mmol/L (ref 98–110)
Creat: 1.11 mg/dL (ref 0.70–1.35)
Globulin: 2.5 g/dL (ref 1.9–3.7)
Glucose, Bld: 97 mg/dL (ref 65–99)
Potassium: 6.4 mmol/L (ref 3.5–5.3)
Sodium: 132 mmol/L — ABNORMAL LOW (ref 135–146)
Total Bilirubin: 0.5 mg/dL (ref 0.2–1.2)
Total Protein: 6.8 g/dL (ref 6.1–8.1)
eGFR: 72 mL/min/{1.73_m2} (ref >=60)

## 2022-12-29 LAB — CBC WITH DIFFERENTIAL/PLATELET
Absolute Monocytes: 632 {cells}/uL (ref 200–950)
Basophils Absolute: 43 {cells}/uL (ref 0–200)
Basophils Relative: 0.6 %
Eosinophils Absolute: 192 {cells}/uL (ref 15–500)
Eosinophils Relative: 2.7 %
HCT: 52 % — ABNORMAL HIGH (ref 38.5–50.0)
Hemoglobin: 17.4 g/dL — ABNORMAL HIGH (ref 13.2–17.1)
Lymphs Abs: 1164 {cells}/uL (ref 850–3900)
MCH: 31.9 pg (ref 27.0–33.0)
MCHC: 33.5 g/dL (ref 32.0–36.0)
MCV: 95.4 fL (ref 80.0–100.0)
MPV: 9 fL (ref 7.5–12.5)
Monocytes Relative: 8.9 %
Neutro Abs: 5069 {cells}/uL (ref 1500–7800)
Neutrophils Relative %: 71.4 %
Platelets: 297 10*3/uL (ref 140–400)
RBC: 5.45 10*6/uL (ref 4.20–5.80)
RDW: 14.2 % (ref 11.0–15.0)
Total Lymphocyte: 16.4 %
WBC: 7.1 10*3/uL (ref 3.8–10.8)

## 2022-12-29 LAB — LIPID PANEL
Cholesterol: 200 mg/dL — ABNORMAL HIGH (ref <200)
HDL: 70 mg/dL (ref >=40)
LDL Cholesterol (Calc): 105 mg/dL — ABNORMAL HIGH
Non-HDL Cholesterol (Calc): 130 mg/dL — ABNORMAL HIGH (ref <130)
Total CHOL/HDL Ratio: 2.9 (calc) (ref <5.0)
Triglycerides: 134 mg/dL (ref <150)

## 2022-12-29 LAB — POCT INR: INR: 2.8 (ref 2.0–3.0)

## 2022-12-29 MED ORDER — WARFARIN SODIUM 5 MG PO TABS: ORAL_TABLET | ORAL | 2 refills | Status: DC

## 2022-12-29 NOTE — Telephone Encounter (Signed)
Provider has already reviewed, we tired to get in touch with patient no answer x3 attempts.

## 2022-12-29 NOTE — Telephone Encounter (Signed)
Kia with [facility] is calling in requesting to speak with [facility] regarding urgent lab results. Please follow up with Kia at [phone]. Reference number: [id number]

## 2022-12-29 NOTE — Telephone Encounter (Signed)
Called patient and advised him what the provider stated about getting labs at the hospital and that they are urgent.  Also that the provider was wanting to him to get a EKG.  He stated, "Not my doctor, I am not doing anything she tells me to do.  I went up there yesterday and wasn't happy that I had to go up there.  So is there anything else that I can help you with?."  I said, "No sir."

## 2022-12-29 NOTE — Assessment & Plan Note (Signed)
Chronic, historic condition Not currently taking statin and he is not fasting for labs today Will check lipid panel today as it has been awhile since it was checked Results to dictate further management  Follow up in 6 months or sooner if concerns arise

## 2022-12-29 NOTE — Assessment & Plan Note (Signed)
Chronic, ongoing He reports he is drinking several cases per week Will check CMP for liver enzymes. May need to order Abdominal US for liver evaluation Recommend he cuts back on this in progressive manner to avoid withdrawal. Follow up in 6 months or sooner if concerns arise

## 2022-12-29 NOTE — Telephone Encounter (Signed)
Report for Erin   12/28/2022 at 11:32 AM collected  p Reason for Disposition  [1] Caller requesting NON-URGENT health information AND [2] PCP's office is the best resource    Critical potassium level of 6.4 called in by [employer] fro [employer], PA-C  Answer Assessment - Initial Assessment Questions 1. REASON FOR CALL or QUESTION: "What is your reason for calling today?" or "How can I best help you?" or "What question do you have that I can help answer?"      Shenelle with [facility] called in a critical potassium level of 6.4 for Erin Mecum, PA-C  Protocols used: Information Only Call - No Triage-A-AH

## 2022-12-29 NOTE — Telephone Encounter (Signed)
  Chief Complaint: Critical potassium level called in by [employer] for [employer], PA-C.    Level is 6.4  Symptoms: N/A Frequency: N/A Pertinent Negatives: Patient denies N/A Disposition: [] ED /[] Urgent Care (no appt availability in office) / [] Appointment(In office/virtual)/ []  [place] [facility]/ [] Home Care/ [] Refused Recommended Disposition /[] [place] [facility]/ [x]  Follow-up with PCP Additional Notes: Critical potassium result sent to [facility] Mecum, PA-C.

## 2022-12-29 NOTE — Progress Notes (Signed)
[name] please call patient and have him scheduled on the nurse visits for an EKG. He will need to go to the hospital for stat labs as well.   Your labs are back  Your electrolytes, liver and kidney function were normal with the exception of your Potassium. Your potassium was very high which can lead to heart arrhythmias. Yesterday your heart was beating in an irregular fashion which I thought was due to your A.fib but this could be due to your Potassium which means you will need prompt attention. Please come back to [facility] for an EKG and you will need to go to the hospital for labs. If your levels are better and there are no EKG changes we can probably manage this outpatient and avoid an ED visit If you are feeling bad or if you would prefer, please go the ED quickly for more prompt attention.

## 2022-12-29 NOTE — Assessment & Plan Note (Signed)
Chronic, historic condition  Appears well managed on current regimen of Carvedilol 6.25 mg PO BID He is on Coumadin and managed by anticoag clinic - will defer to their management recommendations Continue collaboration with Cardiology  Follow up in 6 months or sooner if concerns arise

## 2022-12-29 NOTE — Patient Instructions (Signed)
CONTINUE taking Warfarin 1 tablet every day EXCEPT 1/2 TABLET ON SUNDAYS AND WEDNESDAYS.   Recheck INR 8 weeks

## 2022-12-29 NOTE — Assessment & Plan Note (Signed)
Chronic, historic condition Appears controlled on current regimen comprised of Benazepril 40 mg PO every day and Carvedilol 6.25 mg PO BID Continue with current regimen Follow up in 6 months or sooner if concerns arise

## 2022-12-29 NOTE — Telephone Encounter (Signed)
We've been trying to get in touch with him to get him to go to the hospital for stat labs and he will need EKG. If he can get to [facility] before noon we can do the EKG here or he can see me tomorrow at [facility]. He needs to get his labs done though. Please try to call patient and see if he will pick up for [facility] call to relay info

## 2022-12-31 ENCOUNTER — Telehealth: Payer: Self-pay | Admitting: Physician Assistant

## 2022-12-31 NOTE — Telephone Encounter (Signed)
Left message for patient to give our office a call back to discuss provider's recommendations.

## 2023-01-06 NOTE — Telephone Encounter (Signed)
Unable to contact patient via phone or leave message. Routed to [facility] for further attempts.

## 2023-02-23 ENCOUNTER — Ambulatory Visit: Payer: Medicare Other | Attending: Cardiovascular Disease

## 2023-02-23 DIAGNOSIS — I482 Chronic atrial fibrillation, unspecified: Secondary | ICD-10-CM | POA: Diagnosis present

## 2023-02-23 DIAGNOSIS — Z7901 Long term (current) use of anticoagulants: Secondary | ICD-10-CM | POA: Diagnosis present

## 2023-02-23 LAB — POCT INR: INR: 5.6 — AB (ref 2.0–3.0)

## 2023-02-23 NOTE — Patient Instructions (Signed)
HOLD TOMORROW, FRIDAY and SATURDAY THEN CONTINUE taking Warfarin 1 tablet every day EXCEPT 1/2 TABLET ON SUNDAYS AND WEDNESDAYS.   Recheck INR 5 weeks

## 2023-03-29 ENCOUNTER — Ambulatory Visit: Payer: Medicare Other

## 2023-04-13 ENCOUNTER — Ambulatory Visit: Payer: Medicare Other | Attending: Cardiovascular Disease

## 2023-04-13 DIAGNOSIS — I482 Chronic atrial fibrillation, unspecified: Secondary | ICD-10-CM | POA: Diagnosis not present

## 2023-04-13 DIAGNOSIS — Z7901 Long term (current) use of anticoagulants: Secondary | ICD-10-CM | POA: Diagnosis not present

## 2023-04-13 LAB — POCT INR: INR: 2.3 (ref 2.0–3.0)

## 2023-04-13 NOTE — Patient Instructions (Signed)
 CONTINUE taking Warfarin 1 tablet every day EXCEPT 1/2 TABLET ON SUNDAYS AND WEDNESDAYS.   Recheck INR 6 weeks

## 2023-05-25 ENCOUNTER — Ambulatory Visit: Payer: Medicare Other | Attending: Cardiovascular Disease

## 2023-05-25 DIAGNOSIS — Z7901 Long term (current) use of anticoagulants: Secondary | ICD-10-CM | POA: Insufficient documentation

## 2023-05-25 DIAGNOSIS — I482 Chronic atrial fibrillation, unspecified: Secondary | ICD-10-CM | POA: Insufficient documentation

## 2023-05-25 LAB — POCT INR: INR: 3.7 — AB (ref 2.0–3.0)

## 2023-05-25 NOTE — Patient Instructions (Signed)
 Hold Tomorrow only then  CONTINUE taking Warfarin 1 tablet every day EXCEPT 1/2 TABLET ON SUNDAYS AND WEDNESDAYS.   Recheck INR 6 weeks

## 2023-05-31 ENCOUNTER — Encounter: Payer: Self-pay | Admitting: Nurse Practitioner

## 2023-05-31 ENCOUNTER — Ambulatory Visit (INDEPENDENT_AMBULATORY_CARE_PROVIDER_SITE_OTHER): Payer: Medicare Other | Admitting: Nurse Practitioner

## 2023-05-31 VITALS — BP 130/82 | HR 62 | Temp 97.8°F | Ht 71.0 in | Wt 238.2 lb

## 2023-05-31 DIAGNOSIS — E7800 Pure hypercholesterolemia, unspecified: Secondary | ICD-10-CM

## 2023-05-31 DIAGNOSIS — Z7901 Long term (current) use of anticoagulants: Secondary | ICD-10-CM

## 2023-05-31 DIAGNOSIS — F1010 Alcohol abuse, uncomplicated: Secondary | ICD-10-CM

## 2023-05-31 DIAGNOSIS — I4891 Unspecified atrial fibrillation: Secondary | ICD-10-CM | POA: Diagnosis not present

## 2023-05-31 DIAGNOSIS — I10 Essential (primary) hypertension: Secondary | ICD-10-CM | POA: Diagnosis not present

## 2023-05-31 DIAGNOSIS — N1832 Chronic kidney disease, stage 3b: Secondary | ICD-10-CM | POA: Diagnosis not present

## 2023-05-31 DIAGNOSIS — E66812 Obesity, class 2: Secondary | ICD-10-CM | POA: Diagnosis not present

## 2023-05-31 DIAGNOSIS — Z6839 Body mass index (BMI) 39.0-39.9, adult: Secondary | ICD-10-CM

## 2023-05-31 MED ORDER — ALLOPURINOL 300 MG PO TABS: ORAL_TABLET | ORAL | 1 refills | Status: DC

## 2023-05-31 MED ORDER — BENAZEPRIL HCL 40 MG PO TABS: 40.0000 mg | ORAL_TABLET | Freq: Every day | ORAL | 1 refills | Status: DC

## 2023-05-31 MED ORDER — CARVEDILOL 6.25 MG PO TABS: 6.2500 mg | ORAL_TABLET | Freq: Two times a day (BID) | ORAL | 2 refills | Status: AC

## 2023-05-31 NOTE — Assessment & Plan Note (Signed)
 Chronic.  Controlled.  Continue with current medication regimen.  Labs ordered today.  Return to clinic in 6 months for reevaluation.  Call sooner if concerns arise.  ? ?

## 2023-05-31 NOTE — Assessment & Plan Note (Signed)
 Followed by Cardiology who checks his INR.

## 2023-05-31 NOTE — Assessment & Plan Note (Signed)
Current everyday drinker.  Discussed with patient that is likely why his blood pressure is elevated.

## 2023-05-31 NOTE — Assessment & Plan Note (Signed)
 Recommended eating smaller high protein, low fat meals more frequently and exercising 30 mins a day 5 times a week with a goal of 10-15lb weight loss in the next 3 months.

## 2023-05-31 NOTE — Progress Notes (Signed)
 BP 130/82   Pulse 62   Temp 97.8 F (36.6 C) (Oral)   Ht 5\' 11"  (1.803 m)   Wt 238 lb 3.2 oz (108 kg)   SpO2 97%   BMI 33.22 kg/m    Subjective:    Patient ID: Travis Diaz, male    DOB: [birth year], 68 y.o.   MRN: [id number]  HPI: Travis Diaz is a 68 y.o. male  Chief Complaint  Patient presents with   Hypertension    No concerns   HYPERTENSION / HYPERLIPIDEMIA Satisfied with current treatment? yes Duration of hypertension: years BP monitoring frequency: not checking BP range:  BP medication side effects: no Past BP meds: benazepril and carvedilol Duration of hyperlipidemia: years Cholesterol medication side effects: no Cholesterol supplements: none Past cholesterol medications: none Medication compliance: excellent compliance Aspirin: no Recent stressors: no Recurrent headaches: no Visual changes: no Palpitations: no Dyspnea: no Chest pain: no Lower extremity edema: no Dizzy/lightheaded: no    Relevant past medical, surgical, family and social history reviewed and updated as indicated. Interim medical history since our last visit reviewed. Allergies and medications reviewed and updated.  Review of Systems  Eyes:  Negative for visual disturbance.  Respiratory:  Negative for shortness of breath.   Cardiovascular:  Negative for chest pain and leg swelling.  Neurological:  Negative for light-headedness and headaches.    Per HPI unless specifically indicated above     Objective:    BP 130/82   Pulse 62   Temp 97.8 F (36.6 C) (Oral)   Ht 5\' 11"  (1.803 m)   Wt 238 lb 3.2 oz (108 kg)   SpO2 97%   BMI 33.22 kg/m   Wt Readings from Last 3 Encounters:  05/31/23 238 lb 3.2 oz (108 kg)  12/28/22 246 lb 6.4 oz (111.8 kg)  10/04/22 259 lb (117.5 kg)    Physical Exam Vitals and nursing note reviewed.  Constitutional:      General: He is not in acute distress.    Appearance: Normal appearance. He is obese. He is not ill-appearing,  toxic-appearing or diaphoretic.  HENT:     Head: Normocephalic.     Right Ear: External ear normal.     Left Ear: External ear normal.     Nose: Nose normal. No congestion or rhinorrhea.     Mouth/Throat:     Mouth: Mucous membranes are moist.  Eyes:     General:        Right eye: No discharge.        Left eye: No discharge.     Extraocular Movements: Extraocular movements intact.     Conjunctiva/sclera: Conjunctivae normal.     Pupils: Pupils are equal, round, and reactive to light.  Cardiovascular:     Rate and Rhythm: Normal rate and regular rhythm.     Heart sounds: No murmur heard. Pulmonary:     Effort: Pulmonary effort is normal. No respiratory distress.     Breath sounds: Normal breath sounds. No wheezing, rhonchi or rales.  Abdominal:     General: Abdomen is flat. Bowel sounds are normal.  Musculoskeletal:     Cervical back: Normal range of motion and neck supple.  Skin:    General: Skin is warm and dry.     Capillary Refill: Capillary refill takes less than 2 seconds.  Neurological:     General: No focal deficit present.     Mental Status: He is alert and oriented to person, place, and time.  Psychiatric:        Mood and Affect: Mood normal.        Behavior: Behavior normal.        Thought Content: Thought content normal.        Judgment: Judgment normal.     Results for orders placed or performed in visit on 05/25/23  POCT INR   Collection Time: 05/25/23  9:51 AM  Result Value Ref Range   INR 3.7 (A) 2.0 - 3.0   POC INR        Assessment & Plan:   Problem List Items Addressed This Visit       Cardiovascular and Mediastinum   Hypertension - Primary   Chronic.  Controlled.  Elevated on first check but then improved.  Continue with current medication regimen of Benazepril.  Refills sent today.  Return to clinic in 6 months for reevaluation.  Call sooner if concerns arise.       Relevant Medications   benazepril (LOTENSIN) 40 MG tablet   carvedilol  (COREG) 6.25 MG tablet   Other Relevant Orders   Comprehensive metabolic panel   Atrial fibrillation (HCC)   Chronic.  Controlled.  Continue with current medication regimen of Carvedilol and Coumadin.  Followed by Cardiology.  Has INR checked with Cardiology clinic. Return to clinic in 6 months for reevaluation.  Call sooner if concerns arise.       Relevant Medications   benazepril (LOTENSIN) 40 MG tablet   carvedilol (COREG) 6.25 MG tablet     Genitourinary   Stage 3b chronic kidney disease (HCC)   Chronic.  Controlled.  Continue with current medication regimen.  Labs ordered today.  Return to clinic in 6 months for reevaluation.  Call sooner if concerns arise.        Relevant Orders   CBC w/Diff     Other   Obesity   Recommended eating smaller high protein, low fat meals more frequently and exercising 30 mins a day 5 times a week with a goal of 10-15lb weight loss in the next 3 months.       ETOH abuse   Current everyday drinker.  Discussed with patient that is likely why his blood pressure is elevated.       Relevant Medications   allopurinol (ZYLOPRIM) 300 MG tablet   Long term (current) use of anticoagulants   Followed by Cardiology who checks his INR.      Pure hypercholesterolemia   Chronic.  Controlled.  Continue with current medication regimen.  Labs ordered today.  Return to clinic in 6 months for reevaluation.  Call sooner if concerns arise.        Relevant Medications   benazepril (LOTENSIN) 40 MG tablet   carvedilol (COREG) 6.25 MG tablet   Other Relevant Orders   Lipid panel     Follow up plan: No follow-ups on file.

## 2023-05-31 NOTE — Assessment & Plan Note (Addendum)
 Chronic.  Controlled.  Elevated on first check but then improved.  Continue with current medication regimen of Benazepril.  Refills sent today.  Return to clinic in 6 months for reevaluation.  Call sooner if concerns arise.

## 2023-05-31 NOTE — Assessment & Plan Note (Signed)
 Chronic.  Controlled.  Continue with current medication regimen of Carvedilol and Coumadin.  Followed by Cardiology.  Has INR checked with Cardiology clinic. Return to clinic in 6 months for reevaluation.  Call sooner if concerns arise.

## 2023-06-01 LAB — CBC WITH DIFFERENTIAL/PLATELET
Basophils Absolute: 0.1 10*3/uL (ref 0.0–0.2)
Basos: 1 %
EOS (ABSOLUTE): 0.2 10*3/uL (ref 0.0–0.4)
Eos: 3 %
Hematocrit: 52.9 % — ABNORMAL HIGH (ref 37.5–51.0)
Hemoglobin: 18.3 g/dL — ABNORMAL HIGH (ref 13.0–17.7)
Immature Grans (Abs): 0 10*3/uL (ref 0.0–0.1)
Immature Granulocytes: 1 %
Lymphocytes Absolute: 1.2 10*3/uL (ref 0.7–3.1)
Lymphs: 17 %
MCH: 32.5 pg (ref 26.6–33.0)
MCHC: 34.6 g/dL (ref 31.5–35.7)
MCV: 94 fL (ref 79–97)
Monocytes Absolute: 0.7 10*3/uL (ref 0.1–0.9)
Monocytes: 11 %
Neutrophils Absolute: 4.8 10*3/uL (ref 1.4–7.0)
Neutrophils: 67 %
Platelets: 206 10*3/uL (ref 150–450)
RBC: 5.63 x10E6/uL (ref 4.14–5.80)
RDW: 13.3 % (ref 11.6–15.4)
WBC: 7 10*3/uL (ref 3.4–10.8)

## 2023-06-01 LAB — LIPID PANEL
Chol/HDL Ratio: 2.8 ratio (ref 0.0–5.0)
Cholesterol, Total: 196 mg/dL (ref 100–199)
HDL: 70 mg/dL (ref >39)
LDL Chol Calc (NIH): 103 mg/dL — ABNORMAL HIGH (ref 0–99)
Triglycerides: 134 mg/dL (ref 0–149)
VLDL Cholesterol Cal: 23 mg/dL (ref 5–40)

## 2023-06-01 LAB — COMPREHENSIVE METABOLIC PANEL
ALT: 28 IU/L (ref 0–44)
AST: 29 IU/L (ref 0–40)
Albumin: 4.2 g/dL (ref 3.9–4.9)
Alkaline Phosphatase: 59 IU/L (ref 44–121)
BUN/Creatinine Ratio: 15 (ref 10–24)
BUN: 18 mg/dL (ref 8–27)
Bilirubin Total: 1.2 mg/dL (ref 0.0–1.2)
CO2: 22 mmol/L (ref 20–29)
Calcium: 9.7 mg/dL (ref 8.6–10.2)
Chloride: 99 mmol/L (ref 96–106)
Creatinine, Ser: 1.2 mg/dL (ref 0.76–1.27)
Globulin, Total: 2.5 g/dL (ref 1.5–4.5)
Glucose: 104 mg/dL — ABNORMAL HIGH (ref 70–99)
Potassium: 6 mmol/L — ABNORMAL HIGH (ref 3.5–5.2)
Sodium: 135 mmol/L (ref 134–144)
Total Protein: 6.7 g/dL (ref 6.0–8.5)
eGFR: 66 mL/min/{1.73_m2} (ref >59)

## 2023-06-01 NOTE — Addendum Note (Signed)
 Addended by: Larae Grooms on: 06/01/2023 09:10 AM   Modules accepted: Orders

## 2023-07-06 ENCOUNTER — Ambulatory Visit: Payer: Medicare Other | Attending: Cardiovascular Disease

## 2023-07-06 DIAGNOSIS — I482 Chronic atrial fibrillation, unspecified: Secondary | ICD-10-CM

## 2023-07-06 DIAGNOSIS — Z7901 Long term (current) use of anticoagulants: Secondary | ICD-10-CM | POA: Diagnosis present

## 2023-07-06 LAB — POCT INR: INR: 3.5 — AB (ref 2.0–3.0)

## 2023-07-06 NOTE — Patient Instructions (Signed)
 Hold Tomorrow only then  CONTINUE taking Warfarin 1 tablet every day Except 0.5 tablet on Sundays, Wednesdays and Saturdays.   Recheck INR 6 weeks

## 2023-08-17 ENCOUNTER — Ambulatory Visit: Attending: Cardiovascular Disease

## 2023-08-17 DIAGNOSIS — Z7901 Long term (current) use of anticoagulants: Secondary | ICD-10-CM | POA: Diagnosis present

## 2023-08-17 DIAGNOSIS — I482 Chronic atrial fibrillation, unspecified: Secondary | ICD-10-CM | POA: Diagnosis present

## 2023-08-17 LAB — POCT INR: INR: 2.4 (ref 2.0–3.0)

## 2023-08-17 NOTE — Patient Instructions (Signed)
 Description   Continue on same dosage Warfarin 1 tablet every day Except 0.5 tablet on Sundays, Wednesdays and Saturdays.   Recheck INR 6 weeks

## 2023-10-05 ENCOUNTER — Ambulatory Visit: Attending: Cardiovascular Disease

## 2023-10-05 DIAGNOSIS — I482 Chronic atrial fibrillation, unspecified: Secondary | ICD-10-CM | POA: Insufficient documentation

## 2023-10-05 DIAGNOSIS — Z7901 Long term (current) use of anticoagulants: Secondary | ICD-10-CM | POA: Diagnosis present

## 2023-10-05 LAB — POCT INR: INR: 4.1 — AB (ref 2.0–3.0)

## 2023-10-05 MED ORDER — WARFARIN SODIUM 5 MG PO TABS: ORAL_TABLET | ORAL | 1 refills | Status: AC

## 2023-10-05 NOTE — Patient Instructions (Addendum)
    Description   Skip tomorrow's dosage of Warfarin, then resume same dosage Warfarin 1 tablet every day Except 0.5 tablet on Sundays, Wednesdays and Fridays.   Recheck INR 3 weeks

## 2023-10-26 ENCOUNTER — Ambulatory Visit: Attending: Cardiovascular Disease

## 2023-10-26 DIAGNOSIS — I482 Chronic atrial fibrillation, unspecified: Secondary | ICD-10-CM | POA: Diagnosis present

## 2023-10-26 DIAGNOSIS — I10 Essential (primary) hypertension: Secondary | ICD-10-CM | POA: Diagnosis present

## 2023-10-26 DIAGNOSIS — E7800 Pure hypercholesterolemia, unspecified: Secondary | ICD-10-CM | POA: Insufficient documentation

## 2023-10-26 DIAGNOSIS — Z7901 Long term (current) use of anticoagulants: Secondary | ICD-10-CM | POA: Insufficient documentation

## 2023-10-26 LAB — POCT INR: INR: 5 — AB (ref 2.0–3.0)

## 2023-10-26 NOTE — Patient Instructions (Signed)
 Skip tomorrow's and Friday's dosage of Warfarin, then Decrease to 0.5 tablet every day Except 1 tablet on Tuesdays and Thursday. Recheck INR 3 weeks

## 2023-10-27 ENCOUNTER — Ambulatory Visit: Admitting: Cardiovascular Disease

## 2023-10-27 ENCOUNTER — Encounter: Payer: Self-pay | Admitting: Cardiovascular Disease

## 2023-10-27 VITALS — BP 80/40 | HR 60 | Ht 71.0 in | Wt 230.2 lb

## 2023-10-27 DIAGNOSIS — E7800 Pure hypercholesterolemia, unspecified: Secondary | ICD-10-CM | POA: Diagnosis not present

## 2023-10-27 DIAGNOSIS — I10 Essential (primary) hypertension: Secondary | ICD-10-CM | POA: Diagnosis not present

## 2023-10-27 DIAGNOSIS — Z7901 Long term (current) use of anticoagulants: Secondary | ICD-10-CM

## 2023-10-27 DIAGNOSIS — I482 Chronic atrial fibrillation, unspecified: Secondary | ICD-10-CM | POA: Diagnosis not present

## 2023-10-27 MED ORDER — BENAZEPRIL HCL 20 MG PO TABS: 20.0000 mg | ORAL_TABLET | Freq: Every day | ORAL | 3 refills | Status: DC

## 2023-10-27 NOTE — Patient Instructions (Addendum)
 Medication Instructions:  Your physician recommends the following medication changes.  DECREASE: Benazepril  (LOTENSIN ) from 40 mg to 20 mg by mouth daily  Take all other medications as prescribed   *If you need a refill on your cardiac medications before your next appointment, please call your pharmacy*  Lab Work:  Your provider would like for you to have following labs drawn today CBC, CMP  If you have labs (blood work) drawn today and your tests are completely normal, you will receive your results only by: MyChart Message (if you have MyChart) OR A paper copy in the mail If you have any lab test that is abnormal or we need to change your treatment, we will call you to review the results.  Testing/Procedures:  No test ordered today   Follow-Up: At [facility], you and your health needs are our priority.  As part of our continuing mission to provide you with exceptional heart care, our providers are all part of one team.  This team includes your primary Cardiologist (physician) and Advanced Practice Providers or APPs (Physician Assistants and Nurse Practitioners) who all work together to provide you with the care you need, when you need it.  Your next appointment:   12 month(s)  Provider:   You may see Deatrice Cage, MD or one of the following Advanced Practice Providers on your designated Care Team:   Lonni Meager, NP Lesley Maffucci, PA-C Bernardino Bring, PA-C Cadence [place], PA-C Tylene Lunch, NP Barnie Hila, NP

## 2023-10-27 NOTE — Progress Notes (Signed)
 Cardiology Office Note   Date:  10/27/2023   ID:  Travis Diaz, DOB [birth year], MRN [id number]  PCP:  Melvin Pao, NP  Cardiologist:   Deatrice Cage, MD   Chief Complaint  Patient presents with   Follow-up    12 month f/u no complaints today. Meds reviewed verbally with pt.       History of Present Illness: Travis Diaz is a 68 y.o. male who is here today for follow-up visit regarding permanent atrial fibrillation.  He has known history of essential hypertension, hyperlipidemia, obesity, tobacco use and alcohol use.  He quit smoking in 2006.  He drinks beer and liquor.  Sometimes he drinks 15 beers when he is sports but he reports that he does not do that very often and denies excessive drinking.   He was noted incidentally to be in atrial fibrillation during an office visit in October of 2022.  Carvedilol  was added for blood pressure and rate control.  He was started on anticoagulation with Xarelto .  An echocardiogram showed normal LV systolic function with moderate biatrial enlargement and moderate mitral regurgitation. He was asymptomatic from atrial fibrillation standpoint and thus was treated with rate control.  He could not afford Xarelto  and thus was switched to warfarin. He was hospitalized in February of 2023 with upper GI bleed.  INR was 2.5.  EGD showed 2 nonbleeding gastric ulcers, gastritis and duodenal erosions.  He stopped drinking hard liquor at that time which was thought to be contributing to gastritis.  He cut down on alcohol intake overall. He has some lower extremity swelling with chronic stasis dermatitis and superficial ulceration.  He continues to drink beer but less than before.  He consumes 2 cases a week.  He denies chest pain or shortness of breath.  He is hypotensive today but he denies dizziness.  No blood in stool or dark stool.  He does complain of chronic abdominal pain especially in the morning.  Past Medical History:  Diagnosis  Date   A-fib [facility])    Acute GI bleeding 05/14/2021   Gout    Hypertension     Past Surgical History:  Procedure Laterality Date   broken arm     COLONOSCOPY WITH PROPOFOL  N/A 05/14/2016   Procedure: COLONOSCOPY WITH PROPOFOL ;  Surgeon: Lamar ONEIDA Holmes, MD;  Location: [facility] ENDOSCOPY;  Service: Endoscopy;  Laterality: N/A;   COLONOSCOPY WITH PROPOFOL  N/A 04/23/2022   Procedure: COLONOSCOPY WITH PROPOFOL ;  Surgeon: Onita Elspeth Sharper, DO;  Location: [facility] ENDOSCOPY;  Service: Gastroenterology;  Laterality: N/A;   ESOPHAGOGASTRODUODENOSCOPY (EGD) WITH PROPOFOL  N/A 05/14/2021   Procedure: ESOPHAGOGASTRODUODENOSCOPY (EGD) WITH PROPOFOL ;  Surgeon: Onita Elspeth Sharper, DO;  Location: [facility] ENDOSCOPY;  Service: Gastroenterology;  Laterality: N/A;   upper leg procedure Right    removal of blood clots     Current Outpatient Medications  Medication Sig Dispense Refill   allopurinol  (ZYLOPRIM ) 300 MG tablet TAKE 1 TABLET BY MOUTH ONCE DAILY . APPOINTMENT REQUIRED FOR FUTURE REFILLS 90 tablet 1   benazepril  (LOTENSIN ) 40 MG tablet Take 1 tablet (40 mg total) by mouth daily. 90 tablet 1   carvedilol  (COREG ) 6.25 MG tablet Take 1 tablet (6.25 mg total) by mouth 2 (two) times daily. 180 tablet 2   Flaxseed, Linseed, (FLAXSEED OIL) 1200 MG CAPS Take 1,000 mg by mouth daily.      meloxicam  (MOBIC ) 7.5 MG tablet Take 7.5 mg by mouth as needed.     warfarin (COUMADIN ) 5 MG tablet  Take 1/2 a tablet to 1 tablet by mouth daily as directed by the coumadin  clinic. (Patient not taking: Reported on 10/27/2023) 90 tablet 1   No current facility-administered medications for this visit.    Allergies:   Elemental sulfur    Social History:  The patient  reports that he quit smoking about 19 years ago. His smoking use included cigarettes. He has never used smokeless tobacco. He reports current alcohol use of about 24.0 standard drinks of alcohol per week. He reports that he does not use drugs.   Family History:   The patient's family history includes Cancer in his father and mother; Dementia in his maternal grandmother; Diabetes in his sister; Early death in his father and paternal grandfather; Heart attack in his father, maternal grandfather, paternal grandfather, and paternal grandmother; Heart disease in his father.    ROS:  Please see the history of present illness.   Otherwise, review of systems are positive for none.   All other systems are reviewed and negative.    PHYSICAL EXAM: VS:  BP (!) 80/40 (BP Location: Left Arm, Patient Position: Sitting, Cuff Size: Large)   Pulse 60   Ht 5' 11 (1.803 m)   Wt 230 lb 4 oz (104.4 kg)   SpO2 98%   BMI 32.11 kg/m  , BMI Body mass index is 32.11 kg/m. GEN: Well nourished, well developed, in no acute distress  HEENT: normal  Neck: no JVD, carotid bruits, or masses Cardiac: Irregularly irregular; no murmurs, rubs, or gallops.  Mild bilateral leg edema with chronic stasis dermatitis.  Superficial ulceration. Respiratory:  clear to auscultation bilaterally, normal work of breathing GI: soft, nontender, nondistended, + BS MS: no deformity or atrophy  Skin: warm and dry, no rash Neuro:  Strength and sensation are intact Psych: euthymic mood, full affect Vascular: Distal pulses are palpable.   EKG:  EKG is ordered today. EKG showed: Atrial fibrillation Septal infarct , age undetermined        Recent Labs: 05/31/2023: ALT 28; BUN 18; Creatinine, Ser 1.20; Hemoglobin 18.3; Platelets 206; Potassium 6.0; Sodium 135    Lipid Panel    Component Value Date/Time   CHOL 196 05/31/2023 0952   TRIG 134 05/31/2023 0952   HDL 70 05/31/2023 0952   CHOLHDL 2.8 05/31/2023 0952   CHOLHDL 2.9 12/28/2022 1132   LDLCALC 103 (H) 05/31/2023 0952   LDLCALC 105 (H) 12/28/2022 1132      Wt Readings from Last 3 Encounters:  10/27/23 230 lb 4 oz (104.4 kg)  05/31/23 238 lb 3.2 oz (108 kg)  12/28/22 246 lb 6.4 oz (111.8 kg)          01/09/2021    7:57  AM  PAD Screen  Previous PAD dx? No  Previous surgical procedure? No  Pain with walking? No  Feet/toe relief with dangling? No  Painful, non-healing ulcers? No  Extremities discolored? No      ASSESSMENT AND PLAN:  1.  Permanent atrial fibrillation: Ventricular rate is well controlled on carvedilol  and he is asymptomatic.  He is on anticoagulation with warfarin given that he could not afford DOACs.  His INR was elevated yesterday and warfarin is on hold.  Given that his blood pressure is low, I am going to send him for labs today to ensure no bleeding or dehydration.  2.  Essential hypertension: Repeat blood pressure by me was 88/54.  He is asymptomatic.  He took both blood pressure medications this morning almost on an  empty stomach.  Will decrease benazepril  to 20 mg daily and check labs.  3.  Possible sleep apnea: The patient declined sleep study.  4.  GI bleed: No recurrent GI bleed.  Check CBC today  5.  Chronic stasis dermatitis of lower extremities with edema: Distal pulses are normal.  No evidence of peripheral arterial disease.  Continue leg elevation.  6. ETOH abuse: He stopped hard liquor but continues to drink beer.     Disposition:   FU with me in 12 month  Signed,  Deatrice Cage, MD  10/27/2023 7:52 AM    [facility]

## 2023-10-28 ENCOUNTER — Ambulatory Visit: Payer: Self-pay | Admitting: Cardiovascular Disease

## 2023-10-28 ENCOUNTER — Telehealth: Payer: Self-pay | Admitting: Cardiovascular Disease

## 2023-10-28 ENCOUNTER — Encounter: Payer: Self-pay | Admitting: Cardiovascular Disease

## 2023-10-28 LAB — COMPREHENSIVE METABOLIC PANEL WITH GFR
ALT: 20 IU/L (ref 0–44)
AST: 21 IU/L (ref 0–40)
Albumin: 4 g/dL (ref 3.9–4.9)
Alkaline Phosphatase: 68 IU/L (ref 44–121)
BUN/Creatinine Ratio: 12 (ref 10–24)
BUN: 12 mg/dL (ref 8–27)
Bilirubin Total: 0.6 mg/dL (ref 0.0–1.2)
CO2: 18 mmol/L — ABNORMAL LOW (ref 20–29)
Calcium: 9 mg/dL (ref 8.6–10.2)
Chloride: 92 mmol/L — ABNORMAL LOW (ref 96–106)
Creatinine, Ser: 0.98 mg/dL (ref 0.76–1.27)
Globulin, Total: 2 g/dL (ref 1.5–4.5)
Glucose: 71 mg/dL (ref 70–99)
Potassium: 6.2 mmol/L (ref 3.5–5.2)
Sodium: 127 mmol/L — ABNORMAL LOW (ref 134–144)
Total Protein: 6 g/dL (ref 6.0–8.5)
eGFR: 84 mL/min/1.73 (ref >59)

## 2023-10-28 LAB — CBC
Hematocrit: 48.5 % (ref 37.5–51.0)
Hemoglobin: 16.3 g/dL (ref 13.0–17.7)
MCH: 32.5 pg (ref 26.6–33.0)
MCHC: 33.6 g/dL (ref 31.5–35.7)
MCV: 97 fL (ref 79–97)
Platelets: 243 x10E3/uL (ref 150–450)
RBC: 5.02 x10E6/uL (ref 4.14–5.80)
RDW: 14 % (ref 11.6–15.4)
WBC: 6.2 x10E3/uL (ref 3.4–10.8)

## 2023-10-28 NOTE — Telephone Encounter (Signed)
 We are closed this afternoon but I will have someone reach out to the patient to see if he will come in for repeat labs on Monday and an office visit here in 1 month to recheck his blood pressure.

## 2023-10-28 NOTE — Telephone Encounter (Signed)
 Summer from [employer] calling to advise of Critical Lab value  Potassium 6.2  Repeated back to Summer  Please advise - Jon

## 2023-10-28 NOTE — Telephone Encounter (Signed)
 I added his primary care provider to help with follow-up.  He will require a follow-up visit there in few weeks to recheck blood pressure given that we are stopping benazepril  due to hyperkalemia.

## 2023-10-28 NOTE — Telephone Encounter (Signed)
 Called [facility] to request printed results of CMP that was collected 7/31, they are faxing copy to [phone]

## 2023-10-28 NOTE — Telephone Encounter (Signed)
 Can They send us  the results of CMP so I know what his kidney function is.

## 2023-10-28 NOTE — Telephone Encounter (Signed)
 Caller (Summer) is reporting critical lab results.

## 2023-10-28 NOTE — Telephone Encounter (Signed)
 Contacted patient, lab appointment scheduled for Monday.

## 2023-10-28 NOTE — Telephone Encounter (Signed)
 Recommend stopping benazepril  due to hyperkalemia. Provide him with low potassium diet instructions. He should get stat potassium checked at [facility] today.  Schedule follow-up with APP in our office in 1 month to recheck blood pressure.

## 2023-10-31 ENCOUNTER — Other Ambulatory Visit

## 2023-10-31 DIAGNOSIS — F1010 Alcohol abuse, uncomplicated: Secondary | ICD-10-CM

## 2023-11-01 ENCOUNTER — Ambulatory Visit: Payer: Self-pay | Admitting: Nurse Practitioner

## 2023-11-01 DIAGNOSIS — I10 Essential (primary) hypertension: Secondary | ICD-10-CM

## 2023-11-01 LAB — COMPREHENSIVE METABOLIC PANEL WITH GFR
ALT: 23 IU/L (ref 0–44)
AST: 21 IU/L (ref 0–40)
Albumin: 4.2 g/dL (ref 3.9–4.9)
Alkaline Phosphatase: 81 IU/L (ref 44–121)
BUN/Creatinine Ratio: 8 — ABNORMAL LOW (ref 10–24)
BUN: 10 mg/dL (ref 8–27)
Bilirubin Total: 0.6 mg/dL (ref 0.0–1.2)
CO2: 23 mmol/L (ref 20–29)
Calcium: 9.5 mg/dL (ref 8.6–10.2)
Chloride: 92 mmol/L — ABNORMAL LOW (ref 96–106)
Creatinine, Ser: 1.2 mg/dL (ref 0.76–1.27)
Globulin, Total: 2.4 g/dL (ref 1.5–4.5)
Glucose: 88 mg/dL (ref 70–99)
Potassium: 6.1 mmol/L — ABNORMAL HIGH (ref 3.5–5.2)
Sodium: 127 mmol/L — ABNORMAL LOW (ref 134–144)
Total Protein: 6.6 g/dL (ref 6.0–8.5)
eGFR: 66 mL/min/1.73 (ref >59)

## 2023-11-16 ENCOUNTER — Ambulatory Visit: Attending: Cardiovascular Disease

## 2023-11-16 DIAGNOSIS — I482 Chronic atrial fibrillation, unspecified: Secondary | ICD-10-CM | POA: Insufficient documentation

## 2023-11-16 DIAGNOSIS — Z7901 Long term (current) use of anticoagulants: Secondary | ICD-10-CM | POA: Insufficient documentation

## 2023-11-16 LAB — POCT INR: INR: 2 (ref 2.0–3.0)

## 2023-11-16 NOTE — Patient Instructions (Signed)
 Continue 0.5 tablet every day Except 1 tablet on Tuesdays and Thursday. Recheck INR 6 weeks

## 2023-12-01 ENCOUNTER — Ambulatory Visit (INDEPENDENT_AMBULATORY_CARE_PROVIDER_SITE_OTHER): Admitting: Nurse Practitioner

## 2023-12-01 ENCOUNTER — Encounter: Payer: Self-pay | Admitting: Nurse Practitioner

## 2023-12-01 VITALS — BP 147/81 | HR 57 | Temp 98.7°F | Ht 70.8 in | Wt 231.0 lb

## 2023-12-01 DIAGNOSIS — N1832 Chronic kidney disease, stage 3b: Secondary | ICD-10-CM

## 2023-12-01 DIAGNOSIS — Z0000 Encounter for general adult medical examination without abnormal findings: Secondary | ICD-10-CM | POA: Diagnosis not present

## 2023-12-01 DIAGNOSIS — I4891 Unspecified atrial fibrillation: Secondary | ICD-10-CM

## 2023-12-01 DIAGNOSIS — K7460 Unspecified cirrhosis of liver: Secondary | ICD-10-CM | POA: Diagnosis not present

## 2023-12-01 DIAGNOSIS — E66812 Obesity, class 2: Secondary | ICD-10-CM | POA: Diagnosis not present

## 2023-12-01 DIAGNOSIS — E7800 Pure hypercholesterolemia, unspecified: Secondary | ICD-10-CM

## 2023-12-01 DIAGNOSIS — Z7901 Long term (current) use of anticoagulants: Secondary | ICD-10-CM

## 2023-12-01 DIAGNOSIS — F1010 Alcohol abuse, uncomplicated: Secondary | ICD-10-CM

## 2023-12-01 DIAGNOSIS — Z6839 Body mass index (BMI) 39.0-39.9, adult: Secondary | ICD-10-CM

## 2023-12-01 DIAGNOSIS — I10 Essential (primary) hypertension: Secondary | ICD-10-CM

## 2023-12-01 NOTE — Assessment & Plan Note (Signed)
 Chronic.  Controlled.  Continue with current medication regimen of Carvedilol  and Coumadin .  Followed by Cardiology.  Has INR checked with Cardiology clinic.  Suspect it may be related to sleep apnea.  Patient declines sleep study.  Return to clinic in 6 months for reevaluation.  Call sooner if concerns arise.

## 2023-12-01 NOTE — Assessment & Plan Note (Signed)
 Chronic.  Controlled.  Continue with current medication regimen.  Labs ordered today.  Return to clinic in 6 months for reevaluation.  Call sooner if concerns arise.  ? ?

## 2023-12-01 NOTE — Assessment & Plan Note (Signed)
 Chronic.  Controlled.  Not able to tolerate Ace due to elevated K.   Labs ordered today.  Return to clinic in 6 months for reevaluation.  Call sooner if concerns arise.

## 2023-12-01 NOTE — Progress Notes (Signed)
 Subjective:   Travis Diaz is a 68 y.o. male who presents for Medicare Annual/Subsequent preventive examination.  Visit Complete: In person  Patient Medicare AWV questionnaire was completed by the patient on 12/01/23; I have confirmed that all information answered by patient is correct and no changes since this date.  Cardiac Risk Factors include: advanced age (>97men, >35 women);hypertension;male gender;obesity (BMI >30kg/m2)     Objective:    Today's Vitals   12/01/23 0947 12/01/23 0954  BP: (!) 147/81   Pulse: (!) 57   Temp: 98.7 F (37.1 C)   TempSrc: Oral   SpO2: 94%   Weight: 231 lb (104.8 kg)   Height: 5' 10.8 (1.798 m)   PainSc: 0-No pain 0-No pain   Body mass index is 32.4 kg/m.     12/01/2023    9:59 AM 10/04/2022   11:36 AM 04/23/2022    9:24 AM 09/17/2021   12:08 PM 05/14/2021    9:46 PM 05/14/2021    7:55 AM  Advanced Directives  Does Patient Have a Medical Advance Directive? Yes No No No  No  Type of Estate agent of [place];Living will       Does patient want to make changes to medical advance directive? No - Patient declined       Copy of Healthcare Power of Attorney in Chart? No - copy requested       Would patient like information on creating a medical advance directive?  No - Patient declined   No - Patient declined     Current Medications (verified) Outpatient Encounter Medications as of 12/01/2023  Medication Sig   allopurinol  (ZYLOPRIM ) 300 MG tablet TAKE 1 TABLET BY MOUTH ONCE DAILY . APPOINTMENT REQUIRED FOR FUTURE REFILLS   carvedilol  (COREG ) 6.25 MG tablet Take 1 tablet (6.25 mg total) by mouth 2 (two) times daily.   Flaxseed, Linseed, (FLAXSEED OIL) 1200 MG CAPS Take 1,000 mg by mouth daily.    meloxicam  (MOBIC ) 7.5 MG tablet Take 7.5 mg by mouth as needed.   warfarin (COUMADIN ) 5 MG tablet Take 1/2 a tablet to 1 tablet by mouth daily as directed by the coumadin  clinic.   benazepril  (LOTENSIN ) 20 MG tablet Take 1  tablet (20 mg total) by mouth daily. (Patient not taking: Reported on 12/01/2023)   [DISCONTINUED] benazepril  (LOTENSIN ) 40 MG tablet Take 1 tablet (40 mg total) by mouth daily.   [DISCONTINUED] warfarin (COUMADIN ) 5 MG tablet Take 1/2 a tablet to 1 tablet by mouth daily as directed by the coumadin  clinic.   No facility-administered encounter medications on file as of 12/01/2023.    Allergies (verified) Elemental sulfur   History: Past Medical History:  Diagnosis Date   A-fib (HCC)    Acute GI bleeding 05/14/2021   Gout    Hypertension    Past Surgical History:  Procedure Laterality Date   broken arm     COLONOSCOPY WITH PROPOFOL  N/A 05/14/2016   Procedure: COLONOSCOPY WITH PROPOFOL ;  Surgeon: Lamar ONEIDA Holmes, MD;  Location: [facility] ENDOSCOPY;  Service: Endoscopy;  Laterality: N/A;   COLONOSCOPY WITH PROPOFOL  N/A 04/23/2022   Procedure: COLONOSCOPY WITH PROPOFOL ;  Surgeon: Onita Elspeth Sharper, DO;  Location: [facility] ENDOSCOPY;  Service: Gastroenterology;  Laterality: N/A;   ESOPHAGOGASTRODUODENOSCOPY (EGD) WITH PROPOFOL  N/A 05/14/2021   Procedure: ESOPHAGOGASTRODUODENOSCOPY (EGD) WITH PROPOFOL ;  Surgeon: Onita Elspeth Sharper, DO;  Location: [facility] ENDOSCOPY;  Service: Gastroenterology;  Laterality: N/A;   upper leg procedure Right    removal of blood clots  Family History  Problem Relation Age of Onset   Cancer Mother        male   Heart disease Father    Cancer Father    Early death Father    Heart attack Father    Diabetes Sister    Dementia Maternal Grandmother    Heart attack Maternal Grandfather    Heart attack Paternal Grandmother    Early death Paternal Grandfather    Heart attack Paternal Grandfather    Social History   Socioeconomic History   Marital status: Married    Spouse name: Not on file   Number of children: Not on file   Years of education: Not on file   Highest education level: Not on file  Occupational History   Not on file  Tobacco Use   Smoking  status: Former    Current packs/day: 0.00    Types: Cigarettes    Quit date: 03/29/2004    Years since quitting: 19.6   Smokeless tobacco: Never  Vaping Use   Vaping status: Never Used  Substance and Sexual Activity   Alcohol use: Yes    Alcohol/week: 24.0 standard drinks of alcohol    Types: 24 Cans of beer per week   Drug use: No   Sexual activity: Yes  Other Topics Concern   Not on file  Social History Narrative   Not on file   Social Drivers of Health   Financial Resource Strain: Low Risk  (12/01/2023)   Overall Financial Resource Strain (CARDIA)    Difficulty of Paying Living Expenses: Not hard at all  Food Insecurity: No Food Insecurity (12/01/2023)   Hunger Vital Sign    Worried About Running Out of Food in the Last Year: Never true    Ran Out of Food in the Last Year: Never true  Transportation Needs: No Transportation Needs (12/01/2023)   PRAPARE - Administrator, Civil Service (Medical): No    Lack of Transportation (Non-Medical): No  Physical Activity: Inactive (12/01/2023)   Exercise Vital Sign    Days of Exercise per Week: 0 days    Minutes of Exercise per Session: 0 min  Stress: No Stress Concern Present (12/01/2023)   [employer] of Occupational Health - Occupational Stress Questionnaire    Feeling of Stress: Not at all  Social Connections: Moderately Isolated (12/01/2023)   Social Connection and Isolation Panel    Frequency of Communication with Friends and Family: Never    Frequency of Social Gatherings with Friends and Family: Never    Attends Religious Services: Never    Database administrator or Organizations: Yes    Attends Engineer, structural: Never    Marital Status: Married    Tobacco Counseling Counseling given: Not Answered   Clinical Intake:  Pre-visit preparation completed: No  Pain : No/denies pain Pain Score: 0-No pain     BMI - recorded: 32.41 Nutritional Status: BMI > 30  Obese Nutritional Risks:  None Diabetes: No  How often do you need to have someone help you when you read instructions, pamphlets, or other written materials from your doctor or pharmacy?: 1 - Never What is the last grade level you completed in school?: 12th grade  Interpreter Needed?: No  Comments: Laymon Metro, CMA   Activities of Daily Living    12/01/2023    9:56 AM 12/28/2022   10:45 AM  In your present state of health, do you have any difficulty performing the following activities:  Hearing? 0 1  Vision? 0 1  Difficulty concentrating or making decisions? 1 0  Walking or climbing stairs? 0 0  Dressing or bathing? 0 0  Doing errands, shopping? 0 0  Preparing Food and eating ? N   Using the Toilet? N   In the past six months, have you accidently leaked urine? N   Do you have problems with loss of bowel control? N   Managing your Medications? N   Managing your Finances? N   Housekeeping or managing your Housekeeping? N     Patient Care Team: Melvin Pao, NP as PCP - General (Nurse Practitioner) Darron Deatrice LABOR, MD as PCP - Cardiology (Cardiology)  Indicate any recent Medical Services you may have received from other than [facility] providers in the past year (date may be approximate).     Assessment:   This is a routine wellness examination for [employer].  Hearing/Vision screen No results found.   Goals Addressed   None    Depression Screen    05/31/2023    9:39 AM 12/28/2022   10:45 AM 10/04/2022   11:34 AM 06/01/2022    9:57 AM 04/26/2022   10:54 AM 01/21/2022    9:09 AM 09/17/2021   12:10 PM  PHQ 2/9 Scores  PHQ - 2 Score 0 0 0 0 0 1 0  PHQ- 9 Score 0 0 0 0 0 1 0    Fall Risk    05/31/2023    9:39 AM 12/28/2022   10:45 AM 10/04/2022   11:36 AM 06/01/2022    9:57 AM 04/26/2022   10:54 AM  Fall Risk   Falls in the past year? 0 0 0 0 0  Number falls in past yr: 0 0 0 0 0  Injury with Fall? 0 0 0 0 0  Risk for fall due to : History of fall(s) No Fall Risks History of fall(s) No Fall  Risks No Fall Risks  Follow up Falls evaluation completed Falls prevention discussed;Education provided;Falls evaluation completed Falls prevention discussed;Falls evaluation completed Falls evaluation completed Falls evaluation completed    MEDICARE RISK AT HOME: Medicare Risk at Home Any stairs in or around the home?: Yes If so, are there any without handrails?: No Home free of loose throw rugs in walkways, pet beds, electrical cords, etc?: Yes Adequate lighting in your home to reduce risk of falls?: Yes Life alert?: No Use of a cane, walker or w/c?: No Grab bars in the bathroom?: No Shower chair or bench in shower?: No Elevated toilet seat or a handicapped toilet?: No  TIMED UP AND GO:  Was the test performed?  Yes  Length of time to ambulate 10 feet: 4 sec Gait steady and fast without use of assistive device    Cognitive Function:        12/01/2023   10:01 AM 10/04/2022   11:38 AM 09/17/2021   12:07 PM  6CIT Screen  What Year? 0 points 0 points 0 points  What month? 0 points 0 points 0 points  What time? 0 points 0 points 0 points  Count back from 20 0 points 0 points 0 points  Months in reverse 0 points 0 points 0 points  Repeat phrase 0 points 0 points 0 points  Total Score 0 points 0 points 0 points    Immunizations Immunization History  Administered Date(s) Administered   Td 03/29/2002   Tdap 01/13/2016    TDAP status: Up to date  Flu Vaccine status: Declined, Education  has been provided regarding the importance of this vaccine but patient still declined. Advised may receive this vaccine at local pharmacy or Health Dept. Aware to provide a copy of the vaccination record if obtained from local pharmacy or Health Dept. Verbalized acceptance and understanding.  Pneumococcal vaccine status: Declined,  Education has been provided regarding the importance of this vaccine but patient still declined. Advised may receive this vaccine at local pharmacy or Health Dept.  Aware to provide a copy of the vaccination record if obtained from local pharmacy or Health Dept. Verbalized acceptance and understanding.   Covid-19 vaccine status: Declined, Education has been provided regarding the importance of this vaccine but patient still declined. Advised may receive this vaccine at local pharmacy or Health Dept.or vaccine clinic. Aware to provide a copy of the vaccination record if obtained from local pharmacy or Health Dept. Verbalized acceptance and understanding.  Qualifies for Shingles Vaccine? Yes   Zostavax completed No   Shingrix Completed?: No.    Education has been provided regarding the importance of this vaccine. Patient has been advised to call insurance company to determine out of pocket expense if they have not yet received this vaccine. Advised may also receive vaccine at local pharmacy or Health Dept. Verbalized acceptance and understanding.  Screening Tests Health Maintenance  Topic Date Due   INFLUENZA VACCINE  06/26/2024 (Originally 10/28/2023)   Medicare Annual Wellness (AWV)  11/30/2024   DTaP/Tdap/Td (3 - Td or Tdap) 01/12/2026   Colonoscopy  04/23/2032   Hepatitis C Screening  Completed   HPV VACCINES  Aged Out   Meningococcal B Vaccine  Aged Out   Pneumococcal Vaccine: 50+ Years  Discontinued   COVID-19 Vaccine  Discontinued   Zoster Vaccines- Shingrix  Discontinued    Health Maintenance  There are no preventive care reminders to display for this patient.   Colorectal cancer screening: Type of screening: Colonoscopy. Completed 04/23/22. Repeat every 10 years  Lung Cancer Screening: (Low Dose CT Chest recommended if Age 86-80 years, 20 pack-year currently smoking OR have quit w/in 15years.) does not qualify.   Lung Cancer Screening Referral: N/A  Additional Screening:  Hepatitis C Screening: does qualify; Completed 01/17/2017  Vision Screening: Recommended annual ophthalmology exams for early detection of glaucoma and other disorders  of the eye. Is the patient up to date with their annual eye exam?  Yes  Who is the provider or what is the name of the office in which the patient attends annual eye exams? [facility] in [place], [place] If pt is not established with a provider, would they like to be referred to a provider to establish care? N/A- patient is established.   Dental Screening: Recommended annual dental exams for proper oral hygiene  Diabetic Foot Exam: N/A- patient not a diabetic  Community Resource Referral / Chronic Care Management: CRR required this visit?  No   CCM required this visit?  No     Plan:     I have personally reviewed and noted the following in the patient's chart:   Medical and social history Use of alcohol, tobacco or illicit drugs  Current medications and supplements including opioid prescriptions. Patient is not currently taking opioid prescriptions. Functional ability and status Nutritional status Physical activity Advanced directives List of other physicians Hospitalizations, surgeries, and ER visits in previous 12 months Vitals Screenings to include cognitive, depression, and falls Referrals and appointments  In addition, I have reviewed and discussed with patient certain preventive protocols, quality metrics,  and best practice recommendations. A written personalized care plan for preventive services as well as general preventive health recommendations were provided to patient.     Laymon LOISE Metro, CMA   12/01/2023   After Visit Summary: (In Person-Printed) AVS printed and given to the patient

## 2023-12-01 NOTE — Assessment & Plan Note (Signed)
 Drinks a couple of cases per week.  Does not drink everyday.

## 2023-12-01 NOTE — Assessment & Plan Note (Signed)
 Followed by Cardiology.  Last INR on 11/16/23 was 2.0.  Continue with follow up with Coumadin  Clinic.

## 2023-12-01 NOTE — Progress Notes (Deleted)
 There were no vitals taken for this visit.   Subjective:    Patient ID: Travis Diaz, male    DOB: [birth year], 68 y.o.   MRN: [id number]  HPI: Travis Diaz is a 68 y.o. male  No chief complaint on file.  HYPERTENSION / HYPERLIPIDEMIA Satisfied with current treatment? yes Duration of hypertension: years BP monitoring frequency: not checking BP range:  BP medication side effects: no Past BP meds: benazepril  and carvedilol  Duration of hyperlipidemia: years Cholesterol medication side effects: no Cholesterol supplements: none Past cholesterol medications: none Medication compliance: excellent compliance Aspirin : no Recent stressors: no Recurrent headaches: no Visual changes: no Palpitations: no Dyspnea: no Chest pain: no Lower extremity edema: no Dizzy/lightheaded: no    Relevant past medical, surgical, family and social history reviewed and updated as indicated. Interim medical history since our last visit reviewed. Allergies and medications reviewed and updated.  Review of Systems  Eyes:  Negative for visual disturbance.  Respiratory:  Negative for shortness of breath.   Cardiovascular:  Negative for chest pain and leg swelling.  Neurological:  Negative for light-headedness and headaches.    Per HPI unless specifically indicated above     Objective:    There were no vitals taken for this visit.  Wt Readings from Last 3 Encounters:  10/27/23 230 lb 4 oz (104.4 kg)  05/31/23 238 lb 3.2 oz (108 kg)  12/28/22 246 lb 6.4 oz (111.8 kg)    Physical Exam Vitals and nursing note reviewed.  Constitutional:      General: He is not in acute distress.    Appearance: Normal appearance. He is obese. He is not ill-appearing, toxic-appearing or diaphoretic.  HENT:     Head: Normocephalic.     Right Ear: External ear normal.     Left Ear: External ear normal.     Nose: Nose normal. No congestion or rhinorrhea.     Mouth/Throat:     Mouth: Mucous  membranes are moist.  Eyes:     General:        Right eye: No discharge.        Left eye: No discharge.     Extraocular Movements: Extraocular movements intact.     Conjunctiva/sclera: Conjunctivae normal.     Pupils: Pupils are equal, round, and reactive to light.  Cardiovascular:     Rate and Rhythm: Normal rate and regular rhythm.     Heart sounds: No murmur heard. Pulmonary:     Effort: Pulmonary effort is normal. No respiratory distress.     Breath sounds: Normal breath sounds. No wheezing, rhonchi or rales.  Abdominal:     General: Abdomen is flat. Bowel sounds are normal.  Musculoskeletal:     Cervical back: Normal range of motion and neck supple.  Skin:    General: Skin is warm and dry.     Capillary Refill: Capillary refill takes less than 2 seconds.  Neurological:     General: No focal deficit present.     Mental Status: He is alert and oriented to person, place, and time.  Psychiatric:        Mood and Affect: Mood normal.        Behavior: Behavior normal.        Thought Content: Thought content normal.        Judgment: Judgment normal.     Results for orders placed or performed in visit on 11/16/23  POCT INR   Collection Time: 11/16/23  8:32 AM  Result Value Ref Range   INR 2.0 2.0 - 3.0   POC INR        Assessment & Plan:   Problem List Items Addressed This Visit       Cardiovascular and Mediastinum   Hypertension   Atrial fibrillation (HCC)     Digestive   Hepatic cirrhosis, unspecified hepatic cirrhosis type, unspecified whether ascites present (HCC)     Genitourinary   Stage 3b chronic kidney disease (HCC) - Primary     Other   Obesity   ETOH abuse   Pure hypercholesterolemia     Follow up plan: No follow-ups on file.

## 2023-12-01 NOTE — Progress Notes (Signed)
 BP (!) 147/81   Pulse (!) 57   Temp 98.7 F (37.1 C) (Oral)   Ht 5' 10.8 (1.798 m)   Wt 231 lb (104.8 kg)   SpO2 94%   BMI 32.40 kg/m    Subjective:    Patient ID: Travis Diaz, male    DOB: January 25, 1956, 68 y.o.   MRN: [id number]  HPI: Travis Diaz is a 68 y.o. male  Chief Complaint  Patient presents with   Atrial Fibrillation   Hyperlipidemia   Hypertension    HYPERTENSION / HYPERLIPIDEMIA Patient was seen by Cardiology in July and Potassium was 6.2.  Dr. Darron had already decreased Benazepril  in half due to low BP in the office.  His recheck K was 6.1.  Benazepril  was stopped at that time.  Patient declined coming back for further checks.  He does endorse snoring at times but denies any apnea episodes.  Satisfied with current treatment? yes Duration of hypertension: years BP monitoring frequency: daily BP range: 150/80 BP medication side effects: no Past BP meds: carvedilol  Duration of hyperlipidemia: years Cholesterol medication side effects: no Cholesterol supplements: none Past cholesterol medications: none Medication compliance: excellent compliance Aspirin : no Recent stressors: no Recurrent headaches: no Visual changes: no Palpitations: no Dyspnea: no Chest pain: no Lower extremity edema: no Dizzy/lightheaded: no  ALCOHOL USE Patient states he drinks a couple of cases per week.        Relevant past medical, surgical, family and social history reviewed and updated as indicated. Interim medical history since our last visit reviewed. Allergies and medications reviewed and updated.  Review of Systems  Eyes:  Negative for visual disturbance.  Respiratory:  Negative for shortness of breath.   Cardiovascular:  Negative for chest pain and leg swelling.  Neurological:  Negative for light-headedness and headaches.    Per HPI unless specifically indicated above     Objective:    BP (!) 147/81   Pulse (!) 57   Temp 98.7 F (37.1 C)  (Oral)   Ht 5' 10.8 (1.798 m)   Wt 231 lb (104.8 kg)   SpO2 94%   BMI 32.40 kg/m   Wt Readings from Last 3 Encounters:  12/01/23 231 lb (104.8 kg)  10/27/23 230 lb 4 oz (104.4 kg)  05/31/23 238 lb 3.2 oz (108 kg)    Physical Exam Vitals and nursing note reviewed.  Constitutional:      General: He is not in acute distress.    Appearance: Normal appearance. He is not ill-appearing, toxic-appearing or diaphoretic.  HENT:     Head: Normocephalic.     Right Ear: External ear normal.     Left Ear: External ear normal.     Nose: Nose normal. No congestion or rhinorrhea.     Mouth/Throat:     Mouth: Mucous membranes are moist.  Eyes:     General:        Right eye: No discharge.        Left eye: No discharge.     Extraocular Movements: Extraocular movements intact.     Conjunctiva/sclera: Conjunctivae normal.     Pupils: Pupils are equal, round, and reactive to light.  Cardiovascular:     Rate and Rhythm: Rhythm irregular.     Heart sounds: No murmur heard. Pulmonary:     Effort: Pulmonary effort is normal. No respiratory distress.     Breath sounds: Normal breath sounds. No wheezing, rhonchi or rales.  Abdominal:     General: Abdomen  is flat. Bowel sounds are normal.  Musculoskeletal:     Cervical back: Normal range of motion and neck supple.  Skin:    General: Skin is warm and dry.     Capillary Refill: Capillary refill takes less than 2 seconds.  Neurological:     General: No focal deficit present.     Mental Status: He is alert and oriented to person, place, and time.  Psychiatric:        Mood and Affect: Mood normal.        Behavior: Behavior normal.        Thought Content: Thought content normal.        Judgment: Judgment normal.     Results for orders placed or performed in visit on 11/16/23  POCT INR   Collection Time: 11/16/23  8:32 AM  Result Value Ref Range   INR 2.0 2.0 - 3.0   POC INR        Assessment & Plan:   Problem List Items Addressed This  Visit       Cardiovascular and Mediastinum   Hypertension   Chronic. Not well controlled.  Elevated K led to patient being taken off Benazepril  at the end of July.  Patient declined further follow up with labs.  Will recheck K at visit today. Will likely start Amlodipine  due to elevated K.  Follow up in 6 months.  Call sooner if concerns arise.       Atrial fibrillation (HCC)   Chronic.  Controlled.  Continue with current medication regimen of Carvedilol  and Coumadin .  Followed by Cardiology.  Has INR checked with Cardiology clinic.  Suspect it may be related to sleep apnea.  Patient declines sleep study.  Return to clinic in 6 months for reevaluation.  Call sooner if concerns arise.         Digestive   Hepatic cirrhosis, unspecified hepatic cirrhosis type, unspecified whether ascites present (HCC)   Chronic.  Controlled.  Continue with current medication regimen.  Labs ordered today.  Return to clinic in 6 months for reevaluation.  Call sooner if concerns arise.          Genitourinary   Stage 3b chronic kidney disease (HCC) - Primary   Chronic.  Controlled.  Not able to tolerate Ace due to elevated K.   Labs ordered today.  Return to clinic in 6 months for reevaluation.  Call sooner if concerns arise.       Relevant Orders   Comp Met (CMET)   CBC w/Diff     Other   Obesity   ETOH abuse   Drinks a couple of cases per week.  Does not drink everyday.       Long term (current) use of anticoagulants   Followed by Cardiology.  Last INR on 11/16/23 was 2.0.  Continue with follow up with Coumadin  Clinic.        Pure hypercholesterolemia   Chronic.  Controlled.  Continue with current medication regimen.  Labs ordered today.  Return to clinic in 6 months for reevaluation.  Call sooner if concerns arise.       Relevant Orders   Lipid Profile   Other Visit Diagnoses       Encounter for Medicare annual wellness exam            Follow up plan: Return in about 6 months (around  05/30/2024) for Physical and Fasting labs.

## 2023-12-01 NOTE — Assessment & Plan Note (Signed)
 Chronic. Not well controlled.  Elevated K led to patient being taken off Benazepril  at the end of July.  Patient declined further follow up with labs.  Will recheck K at visit today. Will likely start Amlodipine  due to elevated K.  Follow up in 6 months.  Call sooner if concerns arise.

## 2023-12-02 ENCOUNTER — Ambulatory Visit: Payer: Self-pay | Admitting: Nurse Practitioner

## 2023-12-02 LAB — CBC WITH DIFFERENTIAL/PLATELET
Basophils Absolute: 0 x10E3/uL (ref 0.0–0.2)
Basos: 0 %
EOS (ABSOLUTE): 0.1 x10E3/uL (ref 0.0–0.4)
Eos: 2 %
Hematocrit: 48.3 % (ref 37.5–51.0)
Hemoglobin: 16.5 g/dL (ref 13.0–17.7)
Immature Grans (Abs): 0 x10E3/uL (ref 0.0–0.1)
Immature Granulocytes: 0 %
Lymphocytes Absolute: 0.8 x10E3/uL (ref 0.7–3.1)
Lymphs: 14 %
MCH: 33.3 pg — ABNORMAL HIGH (ref 26.6–33.0)
MCHC: 34.2 g/dL (ref 31.5–35.7)
MCV: 97 fL (ref 79–97)
Monocytes Absolute: 0.6 x10E3/uL (ref 0.1–0.9)
Monocytes: 10 %
Neutrophils Absolute: 4.4 x10E3/uL (ref 1.4–7.0)
Neutrophils: 74 %
Platelets: 194 x10E3/uL (ref 150–450)
RBC: 4.96 x10E6/uL (ref 4.14–5.80)
RDW: 14.2 % (ref 11.6–15.4)
WBC: 6 x10E3/uL (ref 3.4–10.8)

## 2023-12-02 LAB — COMPREHENSIVE METABOLIC PANEL WITH GFR
ALT: 23 IU/L (ref 0–44)
AST: 22 IU/L (ref 0–40)
Albumin: 4.1 g/dL (ref 3.9–4.9)
Alkaline Phosphatase: 69 IU/L (ref 44–121)
BUN/Creatinine Ratio: 14 (ref 10–24)
BUN: 15 mg/dL (ref 8–27)
Bilirubin Total: 1.3 mg/dL — ABNORMAL HIGH (ref 0.0–1.2)
CO2: 24 mmol/L (ref 20–29)
Calcium: 8.9 mg/dL (ref 8.6–10.2)
Chloride: 97 mmol/L (ref 96–106)
Creatinine, Ser: 1.08 mg/dL (ref 0.76–1.27)
Globulin, Total: 2.1 g/dL (ref 1.5–4.5)
Glucose: 88 mg/dL (ref 70–99)
Potassium: 5.2 mmol/L (ref 3.5–5.2)
Sodium: 133 mmol/L — ABNORMAL LOW (ref 134–144)
Total Protein: 6.2 g/dL (ref 6.0–8.5)
eGFR: 75 mL/min/1.73 (ref >59)

## 2023-12-02 LAB — LIPID PANEL
Chol/HDL Ratio: 2.7 ratio (ref 0.0–5.0)
Cholesterol, Total: 187 mg/dL (ref 100–199)
HDL: 69 mg/dL (ref >39)
LDL Chol Calc (NIH): 95 mg/dL (ref 0–99)
Triglycerides: 131 mg/dL (ref 0–149)
VLDL Cholesterol Cal: 23 mg/dL (ref 5–40)

## 2023-12-02 MED ORDER — AMLODIPINE BESYLATE 5 MG PO TABS: 5.0000 mg | ORAL_TABLET | Freq: Every day | ORAL | 0 refills | Status: DC

## 2023-12-25 IMAGING — US US ABDOMEN LIMITED
1 series · 14 of 25 positions shown · non-contrast
Comparison: None.

CLINICAL DATA: Cirrhosis

EXAM:
ULTRASOUND ABDOMEN LIMITED RIGHT UPPER QUADRANT

[Series 1: us abdomen limited · 0.23mm/px · 14 of 36 slices shown]
[im 1/36]
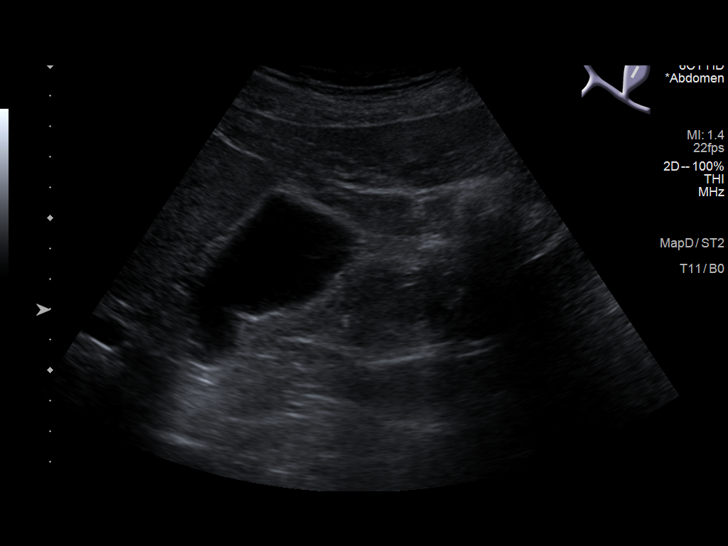
[im 3/36]
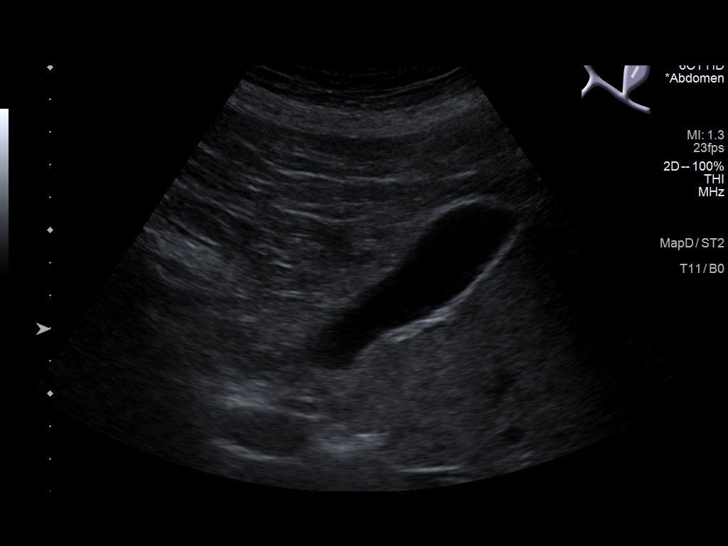
[im 6/36]
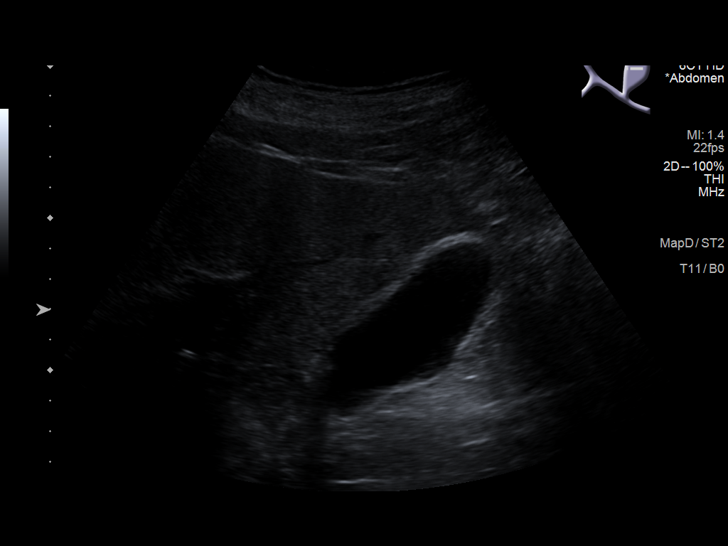
[im 9/36]
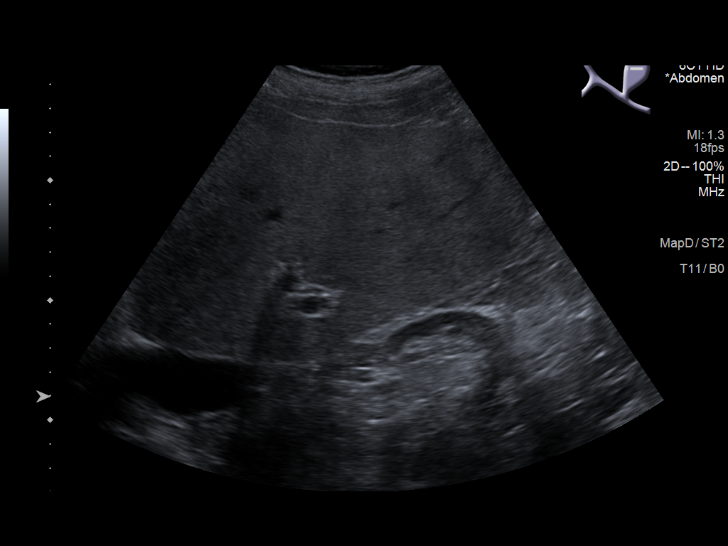
[im 12/36]
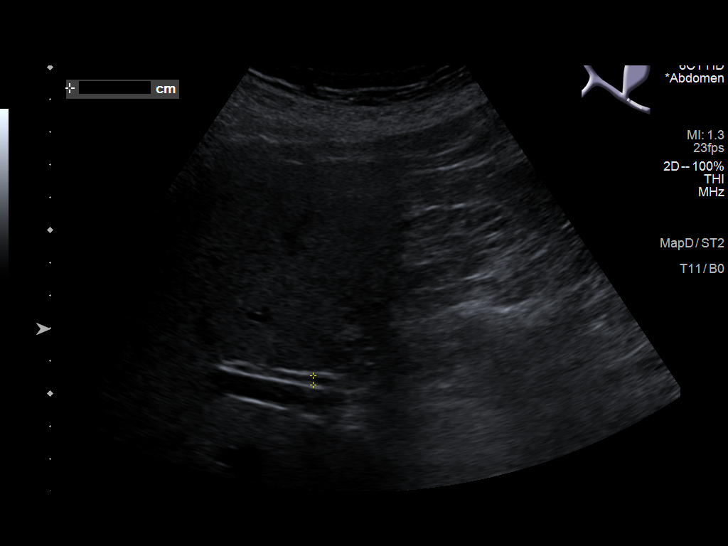
[im 14/36]
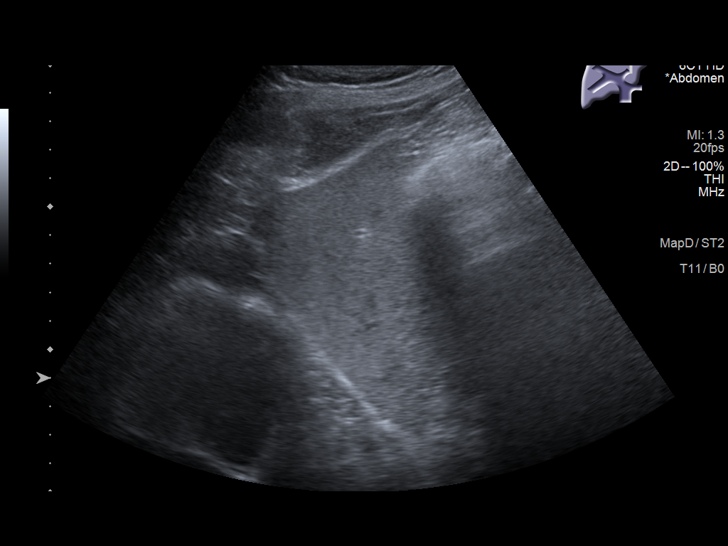
[im 17/36]
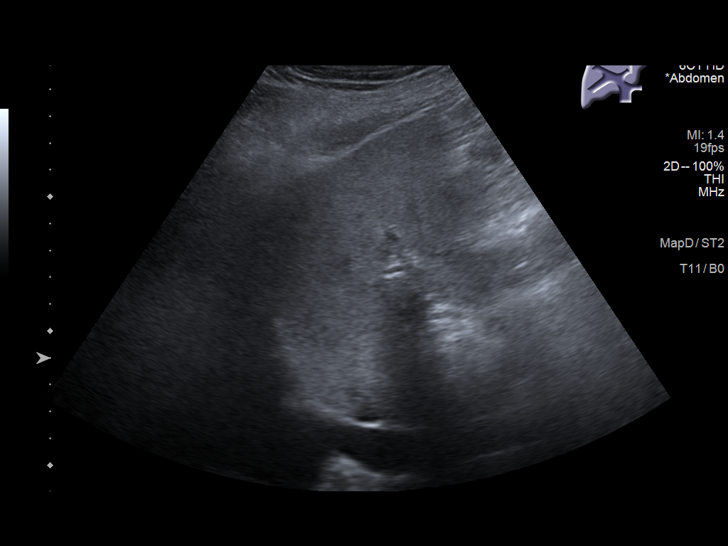
[im 19/36]
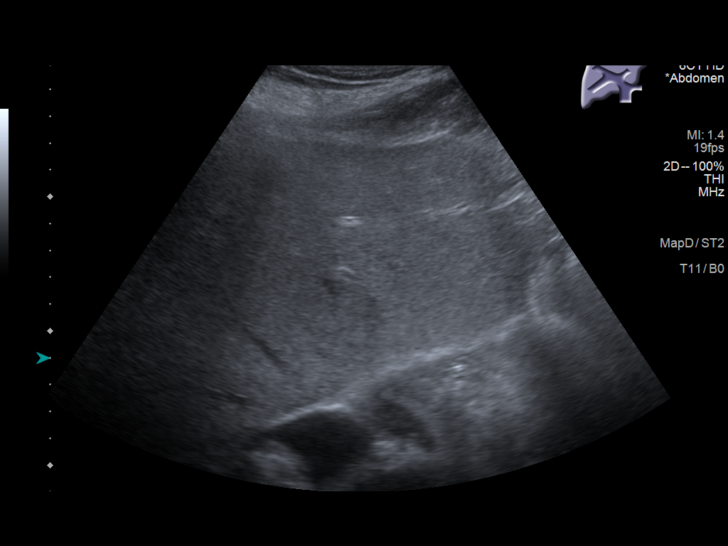
[im 22/36]
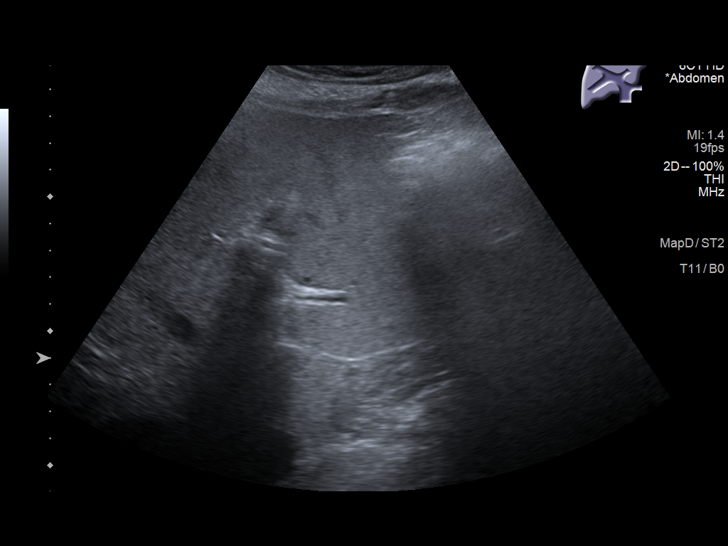
[im 24/36]
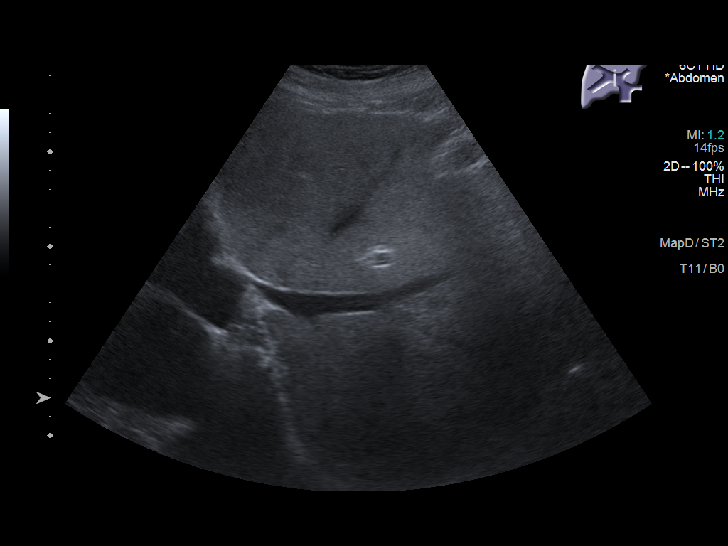
[im 27/36]
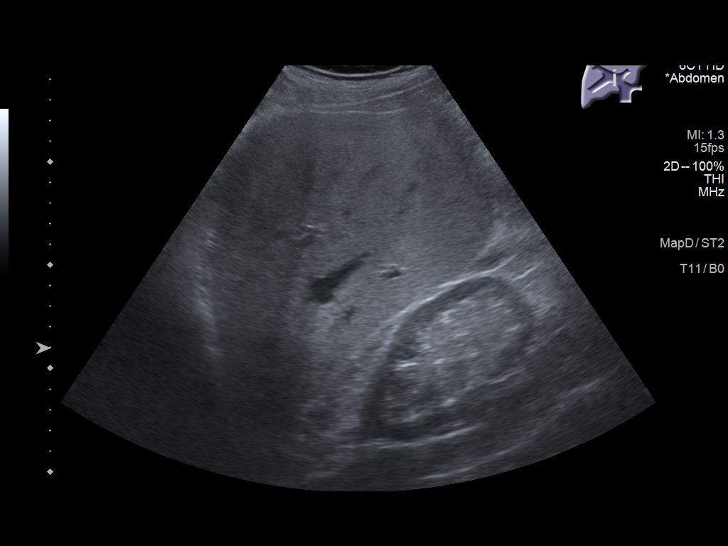
[im 30/36]
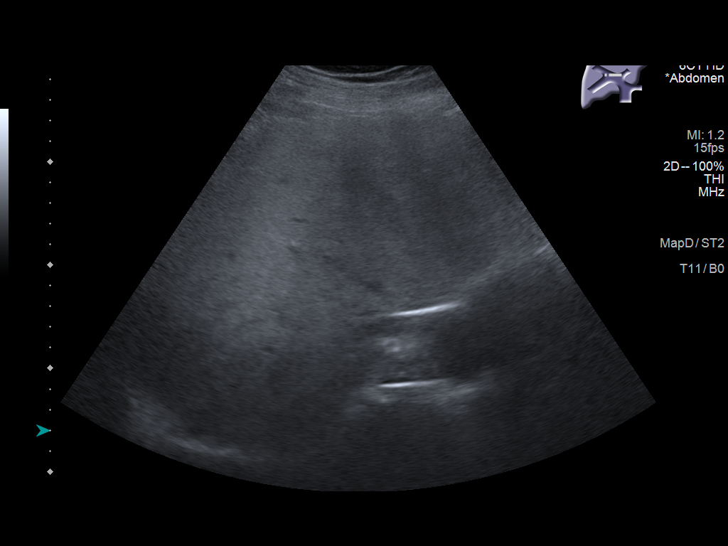
[im 33/36]
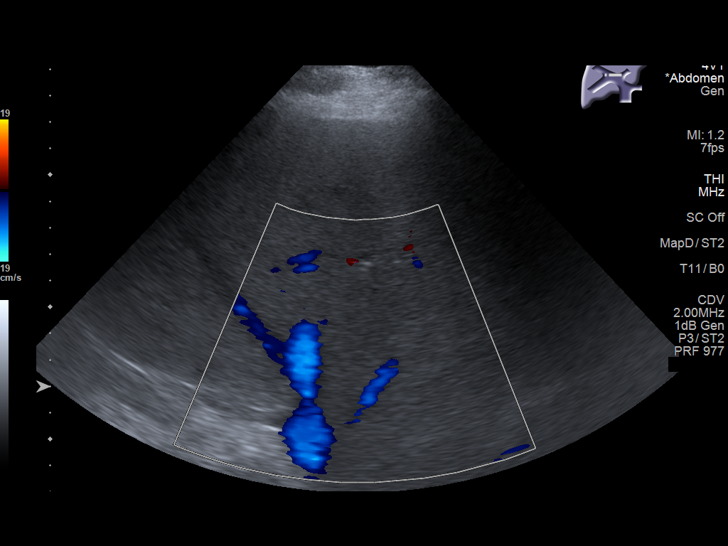
[im 36/36]
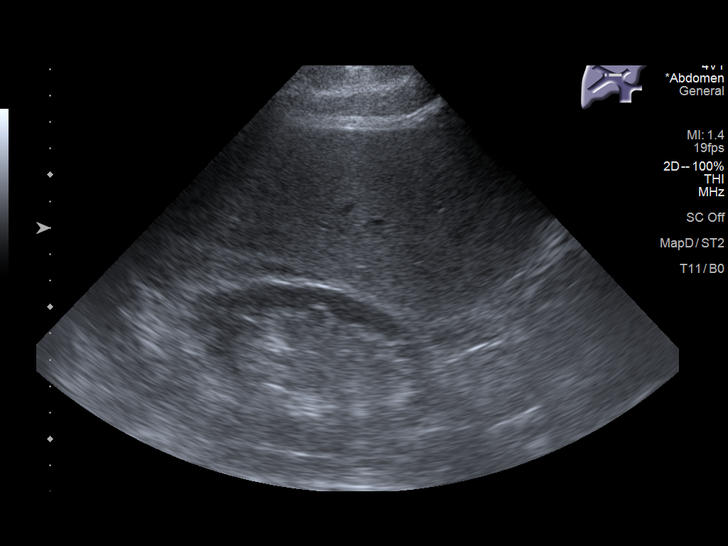

[14 of 25 positions shown; findings below may reference images not displayed]

FINDINGS: Gallbladder:

No gallstones or wall thickening visualized. No sonographic Murphy
sign noted by sonographer.

Common bile duct:

Diameter: 3 mm

Liver:

Parenchymal echogenicity is diffusely increased and mildly
heterogeneous. No focal lesions are identified. The liver surface
contour is mildly nodular. Portal vein is patent on color Doppler
imaging with normal direction of blood flow towards the liver.

Other: None.
IMPRESSION: Increased hepatic parenchymal echogenicity with a mildly nodular
surface contour suggesting cirrhosis. No focal lesions identified.

## 2023-12-26 ENCOUNTER — Encounter: Payer: Self-pay | Admitting: Physician Assistant

## 2023-12-28 ENCOUNTER — Ambulatory Visit: Attending: Cardiovascular Disease

## 2023-12-28 DIAGNOSIS — Z7901 Long term (current) use of anticoagulants: Secondary | ICD-10-CM | POA: Diagnosis present

## 2023-12-28 DIAGNOSIS — I482 Chronic atrial fibrillation, unspecified: Secondary | ICD-10-CM | POA: Diagnosis present

## 2023-12-28 LAB — POCT INR: INR: 1.6 — AB (ref 2.0–3.0)

## 2023-12-28 NOTE — Patient Instructions (Signed)
 Take 1 tablet today only then Continue 0.5 tablet every day Except 1 tablet on Tuesdays and Thursday. Recheck INR 6 weeks

## 2024-01-13 ENCOUNTER — Other Ambulatory Visit: Payer: Self-pay | Admitting: Nurse Practitioner

## 2024-01-13 DIAGNOSIS — F1010 Alcohol abuse, uncomplicated: Secondary | ICD-10-CM

## 2024-01-16 ENCOUNTER — Telehealth: Payer: Self-pay | Admitting: Nurse Practitioner

## 2024-01-16 DIAGNOSIS — F1010 Alcohol abuse, uncomplicated: Secondary | ICD-10-CM

## 2024-01-16 MED ORDER — ALLOPURINOL 300 MG PO TABS: ORAL_TABLET | ORAL | 1 refills | Status: AC

## 2024-01-16 NOTE — Telephone Encounter (Signed)
 Requested Prescriptions  Refused Prescriptions Disp Refills   allopurinol  (ZYLOPRIM ) 300 MG tablet [Pharmacy Med Name: Allopurinol  300 MG Oral Tablet] 90 tablet 0    Sig: TAKE 1 TABLET BY MOUTH ONCE DAILY . APPOINTMENT REQUIRED FOR FUTURE REFILLS     Endocrinology:  Gout Agents - allopurinol  Failed - 01/16/2024  2:25 PM      Failed - Uric Acid in normal range and within 360 days    Uric Acid  Date Value Ref Range Status  10/02/2020 4.1 3.8 - 8.4 mg/dL Final    Comment:               Therapeutic target for gout patients: <6.0         Passed - Cr in normal range and within 360 days    Creat  Date Value Ref Range Status  12/28/2022 1.11 0.70 - 1.35 mg/dL Final   Creatinine, Ser  Date Value Ref Range Status  12/01/2023 1.08 0.76 - 1.27 mg/dL Final         Passed - Valid encounter within last 12 months    Recent Outpatient Visits           1 month ago Stage 3b chronic kidney disease (HCC)   [facility] [place] Melvin Pao, NP   7 months ago Class 2 severe obesity due to excess calories with serious comorbidity and body mass index (BMI) of 39.0 to 39.9 in adult   [facility] Melvin Pao, NP              Passed - CBC within normal limits and completed in the last 12 months    WBC  Date Value Ref Range Status  12/01/2023 6.0 3.4 - 10.8 x10E3/uL Final  12/28/2022 7.1 3.8 - 10.8 Thousand/uL Final   RBC  Date Value Ref Range Status  12/01/2023 4.96 4.14 - 5.80 x10E6/uL Final  12/28/2022 5.45 4.20 - 5.80 Million/uL Final   Hemoglobin  Date Value Ref Range Status  12/01/2023 16.5 13.0 - 17.7 g/dL Final   Hematocrit  Date Value Ref Range Status  12/01/2023 48.3 37.5 - 51.0 % Final   MCHC  Date Value Ref Range Status  12/01/2023 34.2 31.5 - 35.7 g/dL Final  89/98/7975 66.4 32.0 - 36.0 g/dL Final    Comment:    For adults, a slight decrease in the calculated MCHC value (in the range of 30 to 32 g/dL) is most  likely not clinically significant; however, it should be interpreted with caution in correlation with other red cell parameters and the patient's clinical condition.    [facility]  Date Value Ref Range Status  12/01/2023 33.3 (H) 26.6 - 33.0 pg Final  12/28/2022 31.9 27.0 - 33.0 pg Final   MCV  Date Value Ref Range Status  12/01/2023 97 79 - 97 fL Final  04/12/2014 91 80 - 100 fL Final   No results found for: PLTCOUNTKUC, LABPLAT, POCPLA RDW  Date Value Ref Range Status  12/01/2023 14.2 11.6 - 15.4 % Final  04/12/2014 13.4 11.5 - 14.5 % Final

## 2024-01-16 NOTE — Telephone Encounter (Signed)
 Medication sent to the pharmacy.

## 2024-01-16 NOTE — Telephone Encounter (Signed)
 Patient came into the office and stated that he needed his RX forallopurinol (ZYLOPRIM ) 300 MG tablet  sent to [facility] in [place] Please advise.

## 2024-02-08 ENCOUNTER — Ambulatory Visit: Attending: Cardiovascular Disease

## 2024-02-08 DIAGNOSIS — I482 Chronic atrial fibrillation, unspecified: Secondary | ICD-10-CM | POA: Diagnosis present

## 2024-02-08 DIAGNOSIS — Z7901 Long term (current) use of anticoagulants: Secondary | ICD-10-CM | POA: Insufficient documentation

## 2024-02-08 LAB — POCT INR: INR: 1.3 — AB (ref 2.0–3.0)

## 2024-02-08 NOTE — Patient Instructions (Signed)
 Take another 1 tablet today only then Continue 0.5 tablet every day Except 1 tablet on Tuesdays and Thursday. Recheck INR 3 weeks

## 2024-02-17 ENCOUNTER — Other Ambulatory Visit: Payer: Self-pay | Admitting: Nurse Practitioner

## 2024-02-19 NOTE — Telephone Encounter (Signed)
 Requested Prescriptions  Pending Prescriptions Disp Refills   amLODipine  (NORVASC ) 5 MG tablet [Pharmacy Med Name: amLODIPine  Besylate 5 MG Oral Tablet] 90 tablet 0    Sig: Take 1 tablet by mouth once daily     Cardiovascular: Calcium Channel Blockers 2 Failed - 02/19/2024  9:44 AM      Failed - Last BP in normal range    BP Readings from Last 1 Encounters:  12/01/23 (!) 147/81         Passed - Last Heart Rate in normal range    Pulse Readings from Last 1 Encounters:  12/01/23 (!) 57         Passed - Valid encounter within last 6 months    Recent Outpatient Visits           2 months ago Stage 3b chronic kidney disease (HCC)   [facility] Melvin Pao, NP   8 months ago Class 2 severe obesity due to excess calories with serious comorbidity and body mass index (BMI) of 39.0 to 39.9 in adult   [facility] Melvin Pao, NP

## 2024-02-29 ENCOUNTER — Ambulatory Visit: Attending: Cardiovascular Disease

## 2024-02-29 DIAGNOSIS — I482 Chronic atrial fibrillation, unspecified: Secondary | ICD-10-CM | POA: Diagnosis present

## 2024-02-29 DIAGNOSIS — Z7901 Long term (current) use of anticoagulants: Secondary | ICD-10-CM | POA: Diagnosis present

## 2024-02-29 LAB — POCT INR: INR: 1.7 — AB (ref 2.0–3.0)

## 2024-02-29 NOTE — Patient Instructions (Signed)
 Take another 1 tablet today only then Continue 0.5 tablet every day Except 1 tablet on Tuesdays and Thursday. Recheck INR 3 weeks

## 2024-03-21 ENCOUNTER — Ambulatory Visit: Attending: Cardiovascular Disease

## 2024-03-21 DIAGNOSIS — Z7901 Long term (current) use of anticoagulants: Secondary | ICD-10-CM | POA: Diagnosis present

## 2024-03-21 DIAGNOSIS — I482 Chronic atrial fibrillation, unspecified: Secondary | ICD-10-CM | POA: Insufficient documentation

## 2024-03-21 LAB — POCT INR: INR: 1.6 — AB (ref 2.0–3.0)

## 2024-03-21 NOTE — Patient Instructions (Signed)
 Increase to 1 tablet every day Except 0.5 tablet on Mondays, Wednesday and Fridays Recheck INR 4 weeks.  [phone]

## 2024-04-18 ENCOUNTER — Ambulatory Visit: Attending: Cardiovascular Disease

## 2024-04-18 DIAGNOSIS — Z7901 Long term (current) use of anticoagulants: Secondary | ICD-10-CM | POA: Diagnosis present

## 2024-04-18 DIAGNOSIS — I482 Chronic atrial fibrillation, unspecified: Secondary | ICD-10-CM | POA: Insufficient documentation

## 2024-04-18 LAB — POCT INR: INR: 2.5 (ref 2.0–3.0)

## 2024-04-18 NOTE — Patient Instructions (Signed)
 Continue 1 tablet every day Except 0.5 tablet on Mondays, Wednesday and Fridays Recheck INR 8 weeks.  [phone]

## 2024-05-31 ENCOUNTER — Encounter: Admitting: Nurse Practitioner

## 2024-06-13 ENCOUNTER — Ambulatory Visit
